# Patient Record
Sex: Female | Born: 1945 | Race: White | Hispanic: No | Marital: Married | Smoking: Former smoker
Health system: Southern US, Community
[De-identification: ages and names within clinical notes are randomized; demographics above are authoritative.]

## PROBLEM LIST (undated history)

## (undated) DIAGNOSIS — J189 Pneumonia, unspecified organism: Secondary | ICD-10-CM

## (undated) DIAGNOSIS — M1990 Unspecified osteoarthritis, unspecified site: Secondary | ICD-10-CM

## (undated) DIAGNOSIS — N189 Chronic kidney disease, unspecified: Secondary | ICD-10-CM

## (undated) DIAGNOSIS — E785 Hyperlipidemia, unspecified: Secondary | ICD-10-CM

## (undated) DIAGNOSIS — Z8719 Personal history of other diseases of the digestive system: Secondary | ICD-10-CM

## (undated) DIAGNOSIS — R0600 Dyspnea, unspecified: Secondary | ICD-10-CM

## (undated) DIAGNOSIS — F419 Anxiety disorder, unspecified: Secondary | ICD-10-CM

## (undated) DIAGNOSIS — I10 Essential (primary) hypertension: Secondary | ICD-10-CM

## (undated) HISTORY — PX: TUBAL LIGATION: SHX77

## (undated) HISTORY — PX: APPENDECTOMY: SHX54

## (undated) HISTORY — DX: Essential (primary) hypertension: I10

## (undated) HISTORY — PX: EYE SURGERY: SHX253

## (undated) HISTORY — DX: Hyperlipidemia, unspecified: E78.5

---

## 1999-07-27 ENCOUNTER — Encounter: Admission: RE | Admit: 1999-07-27 | Discharge: 1999-07-27 | Payer: Self-pay

## 2002-01-20 ENCOUNTER — Encounter: Payer: Self-pay | Admitting: Family Medicine

## 2002-01-20 ENCOUNTER — Encounter: Admission: RE | Admit: 2002-01-20 | Discharge: 2002-01-20 | Payer: Self-pay | Admitting: Family Medicine

## 2003-09-17 ENCOUNTER — Encounter: Admission: RE | Admit: 2003-09-17 | Discharge: 2003-09-17 | Payer: Self-pay | Admitting: Family Medicine

## 2004-10-07 ENCOUNTER — Ambulatory Visit (HOSPITAL_COMMUNITY): Admission: RE | Admit: 2004-10-07 | Discharge: 2004-10-07 | Payer: Self-pay | Admitting: Family Medicine

## 2004-10-26 ENCOUNTER — Other Ambulatory Visit: Admission: RE | Admit: 2004-10-26 | Discharge: 2004-10-26 | Payer: Self-pay | Admitting: Family Medicine

## 2005-09-28 ENCOUNTER — Ambulatory Visit (HOSPITAL_COMMUNITY): Admission: RE | Admit: 2005-09-28 | Discharge: 2005-09-28 | Payer: Self-pay | Admitting: Unknown Physician Specialty

## 2005-12-11 ENCOUNTER — Other Ambulatory Visit: Admission: RE | Admit: 2005-12-11 | Discharge: 2005-12-11 | Payer: Self-pay | Admitting: Family Medicine

## 2006-09-18 ENCOUNTER — Ambulatory Visit (HOSPITAL_COMMUNITY): Admission: RE | Admit: 2006-09-18 | Discharge: 2006-09-18 | Payer: Self-pay | Admitting: Family Medicine

## 2006-12-12 ENCOUNTER — Other Ambulatory Visit: Admission: RE | Admit: 2006-12-12 | Discharge: 2006-12-12 | Payer: Self-pay | Admitting: Family Medicine

## 2007-10-08 ENCOUNTER — Ambulatory Visit (HOSPITAL_COMMUNITY): Admission: RE | Admit: 2007-10-08 | Discharge: 2007-10-08 | Payer: Self-pay | Admitting: Family Medicine

## 2007-10-22 ENCOUNTER — Encounter: Admission: RE | Admit: 2007-10-22 | Discharge: 2007-10-22 | Payer: Self-pay | Admitting: Family Medicine

## 2008-06-10 ENCOUNTER — Other Ambulatory Visit: Admission: RE | Admit: 2008-06-10 | Discharge: 2008-06-10 | Payer: Self-pay | Admitting: Orthopedic Surgery

## 2008-12-25 ENCOUNTER — Ambulatory Visit (HOSPITAL_COMMUNITY): Admission: RE | Admit: 2008-12-25 | Discharge: 2008-12-25 | Payer: Self-pay | Admitting: Family Medicine

## 2009-08-23 ENCOUNTER — Other Ambulatory Visit: Admission: RE | Admit: 2009-08-23 | Discharge: 2009-08-23 | Payer: Self-pay | Admitting: Family Medicine

## 2009-12-27 ENCOUNTER — Ambulatory Visit (HOSPITAL_COMMUNITY): Admission: RE | Admit: 2009-12-27 | Discharge: 2009-12-27 | Payer: Self-pay | Admitting: Family Medicine

## 2010-11-06 ENCOUNTER — Encounter: Payer: Self-pay | Admitting: Family Medicine

## 2010-11-30 ENCOUNTER — Other Ambulatory Visit (HOSPITAL_COMMUNITY): Payer: Self-pay | Admitting: Family Medicine

## 2010-11-30 DIAGNOSIS — Z1231 Encounter for screening mammogram for malignant neoplasm of breast: Secondary | ICD-10-CM

## 2010-11-30 DIAGNOSIS — J069 Acute upper respiratory infection, unspecified: Secondary | ICD-10-CM

## 2010-12-29 ENCOUNTER — Ambulatory Visit (HOSPITAL_COMMUNITY)
Admission: RE | Admit: 2010-12-29 | Discharge: 2010-12-29 | Disposition: A | Discharge status: Home / Self Care | Payer: Medicare Other | Source: Ambulatory Visit | Attending: Family Medicine | Admitting: Family Medicine

## 2010-12-29 DIAGNOSIS — J069 Acute upper respiratory infection, unspecified: Secondary | ICD-10-CM | POA: Insufficient documentation

## 2010-12-29 DIAGNOSIS — R079 Chest pain, unspecified: Secondary | ICD-10-CM | POA: Insufficient documentation

## 2010-12-29 DIAGNOSIS — Z1231 Encounter for screening mammogram for malignant neoplasm of breast: Secondary | ICD-10-CM

## 2010-12-29 DIAGNOSIS — R0989 Other specified symptoms and signs involving the circulatory and respiratory systems: Secondary | ICD-10-CM | POA: Insufficient documentation

## 2010-12-30 ENCOUNTER — Other Ambulatory Visit (HOSPITAL_COMMUNITY): Payer: Self-pay | Admitting: Family Medicine

## 2010-12-30 DIAGNOSIS — Z09 Encounter for follow-up examination after completed treatment for conditions other than malignant neoplasm: Secondary | ICD-10-CM

## 2011-01-02 ENCOUNTER — Ambulatory Visit (HOSPITAL_COMMUNITY): Admission: RE | Admit: 2011-01-02 | Payer: Medicare Other | Source: Ambulatory Visit

## 2011-01-04 ENCOUNTER — Ambulatory Visit (HOSPITAL_COMMUNITY)
Admission: RE | Admit: 2011-01-04 | Discharge: 2011-01-04 | Disposition: A | Discharge status: Home / Self Care | Payer: Medicare Other | Source: Ambulatory Visit | Attending: Family Medicine | Admitting: Family Medicine

## 2011-01-04 DIAGNOSIS — Z09 Encounter for follow-up examination after completed treatment for conditions other than malignant neoplasm: Secondary | ICD-10-CM

## 2011-01-04 DIAGNOSIS — R911 Solitary pulmonary nodule: Secondary | ICD-10-CM | POA: Insufficient documentation

## 2011-12-14 ENCOUNTER — Other Ambulatory Visit (HOSPITAL_COMMUNITY): Payer: Self-pay | Admitting: Family Medicine

## 2011-12-14 DIAGNOSIS — Z1231 Encounter for screening mammogram for malignant neoplasm of breast: Secondary | ICD-10-CM

## 2012-01-08 ENCOUNTER — Ambulatory Visit (HOSPITAL_COMMUNITY)
Admission: RE | Admit: 2012-01-08 | Discharge: 2012-01-08 | Disposition: A | Discharge status: Home / Self Care | Payer: Medicare Other | Source: Ambulatory Visit | Attending: Family Medicine | Admitting: Family Medicine

## 2012-01-08 DIAGNOSIS — Z1231 Encounter for screening mammogram for malignant neoplasm of breast: Secondary | ICD-10-CM | POA: Insufficient documentation

## 2012-03-01 ENCOUNTER — Other Ambulatory Visit: Payer: Self-pay | Admitting: Family Medicine

## 2012-03-01 ENCOUNTER — Other Ambulatory Visit (HOSPITAL_COMMUNITY)
Admission: RE | Admit: 2012-03-01 | Discharge: 2012-03-01 | Disposition: A | Discharge status: Home / Self Care | Payer: Medicare Other | Source: Ambulatory Visit | Attending: Family Medicine | Admitting: Family Medicine

## 2012-03-01 DIAGNOSIS — Z0000 Encounter for general adult medical examination without abnormal findings: Secondary | ICD-10-CM | POA: Insufficient documentation

## 2013-03-13 ENCOUNTER — Other Ambulatory Visit (HOSPITAL_COMMUNITY): Payer: Self-pay | Admitting: Family Medicine

## 2013-03-13 DIAGNOSIS — Z1231 Encounter for screening mammogram for malignant neoplasm of breast: Secondary | ICD-10-CM

## 2013-03-18 ENCOUNTER — Ambulatory Visit (HOSPITAL_COMMUNITY)
Admission: RE | Admit: 2013-03-18 | Discharge: 2013-03-18 | Disposition: A | Discharge status: Home / Self Care | Payer: Medicare Other | Source: Ambulatory Visit | Attending: Family Medicine | Admitting: Family Medicine

## 2013-03-18 DIAGNOSIS — Z1231 Encounter for screening mammogram for malignant neoplasm of breast: Secondary | ICD-10-CM | POA: Insufficient documentation

## 2013-03-19 ENCOUNTER — Other Ambulatory Visit: Payer: Self-pay | Admitting: Family Medicine

## 2013-03-19 DIAGNOSIS — R928 Other abnormal and inconclusive findings on diagnostic imaging of breast: Secondary | ICD-10-CM

## 2013-04-02 ENCOUNTER — Ambulatory Visit
Admission: RE | Admit: 2013-04-02 | Discharge: 2013-04-02 | Disposition: A | Discharge status: Home / Self Care | Payer: Medicare Other | Source: Ambulatory Visit | Attending: Family Medicine | Admitting: Family Medicine

## 2013-04-02 DIAGNOSIS — R928 Other abnormal and inconclusive findings on diagnostic imaging of breast: Secondary | ICD-10-CM

## 2013-08-27 ENCOUNTER — Other Ambulatory Visit: Payer: Self-pay | Admitting: Family Medicine

## 2013-08-27 DIAGNOSIS — N6310 Unspecified lump in the right breast, unspecified quadrant: Secondary | ICD-10-CM

## 2013-09-30 ENCOUNTER — Ambulatory Visit
Admission: RE | Admit: 2013-09-30 | Discharge: 2013-09-30 | Disposition: A | Discharge status: Home / Self Care | Payer: Medicare Other | Source: Ambulatory Visit | Attending: Family Medicine | Admitting: Family Medicine

## 2013-09-30 DIAGNOSIS — N6310 Unspecified lump in the right breast, unspecified quadrant: Secondary | ICD-10-CM

## 2014-03-06 ENCOUNTER — Other Ambulatory Visit: Payer: Self-pay | Admitting: Family Medicine

## 2014-03-06 DIAGNOSIS — N63 Unspecified lump in unspecified breast: Secondary | ICD-10-CM

## 2014-03-19 ENCOUNTER — Ambulatory Visit
Admission: RE | Admit: 2014-03-19 | Discharge: 2014-03-19 | Disposition: A | Discharge status: Home / Self Care | Payer: Medicare Other | Source: Ambulatory Visit | Attending: Family Medicine | Admitting: Family Medicine

## 2014-03-19 ENCOUNTER — Other Ambulatory Visit: Payer: Self-pay | Admitting: Family Medicine

## 2014-03-19 ENCOUNTER — Encounter (INDEPENDENT_AMBULATORY_CARE_PROVIDER_SITE_OTHER): Payer: Self-pay

## 2014-03-19 DIAGNOSIS — N63 Unspecified lump in unspecified breast: Secondary | ICD-10-CM

## 2015-03-22 ENCOUNTER — Other Ambulatory Visit: Payer: Self-pay | Admitting: Family Medicine

## 2015-03-22 DIAGNOSIS — N63 Unspecified lump in unspecified breast: Secondary | ICD-10-CM

## 2015-04-06 ENCOUNTER — Ambulatory Visit
Admission: RE | Admit: 2015-04-06 | Discharge: 2015-04-06 | Disposition: A | Discharge status: Home / Self Care | Payer: Medicare Other | Source: Ambulatory Visit | Attending: Family Medicine | Admitting: Family Medicine

## 2015-04-06 DIAGNOSIS — N63 Unspecified lump in unspecified breast: Secondary | ICD-10-CM

## 2016-01-21 ENCOUNTER — Other Ambulatory Visit: Payer: Self-pay | Admitting: Gastroenterology

## 2016-04-24 ENCOUNTER — Other Ambulatory Visit: Payer: Self-pay | Admitting: Family Medicine

## 2016-04-24 DIAGNOSIS — Z1231 Encounter for screening mammogram for malignant neoplasm of breast: Secondary | ICD-10-CM

## 2016-04-25 ENCOUNTER — Ambulatory Visit
Admission: RE | Admit: 2016-04-25 | Discharge: 2016-04-25 | Disposition: A | Discharge status: Home / Self Care | Payer: Medicare Other | Source: Ambulatory Visit | Attending: Family Medicine | Admitting: Family Medicine

## 2016-04-25 DIAGNOSIS — Z1231 Encounter for screening mammogram for malignant neoplasm of breast: Secondary | ICD-10-CM

## 2016-06-14 ENCOUNTER — Other Ambulatory Visit: Payer: Self-pay | Admitting: Family Medicine

## 2016-06-14 ENCOUNTER — Ambulatory Visit
Admission: RE | Admit: 2016-06-14 | Discharge: 2016-06-14 | Disposition: A | Discharge status: Home / Self Care | Payer: Medicare Other | Source: Ambulatory Visit | Attending: Family Medicine | Admitting: Family Medicine

## 2016-06-14 DIAGNOSIS — R059 Cough, unspecified: Secondary | ICD-10-CM

## 2016-06-14 DIAGNOSIS — R05 Cough: Secondary | ICD-10-CM

## 2016-12-14 DIAGNOSIS — M810 Age-related osteoporosis without current pathological fracture: Secondary | ICD-10-CM | POA: Diagnosis not present

## 2016-12-14 DIAGNOSIS — J301 Allergic rhinitis due to pollen: Secondary | ICD-10-CM | POA: Diagnosis not present

## 2016-12-14 DIAGNOSIS — I10 Essential (primary) hypertension: Secondary | ICD-10-CM | POA: Diagnosis not present

## 2016-12-14 DIAGNOSIS — R69 Illness, unspecified: Secondary | ICD-10-CM | POA: Diagnosis not present

## 2016-12-14 DIAGNOSIS — N183 Chronic kidney disease, stage 3 (moderate): Secondary | ICD-10-CM | POA: Diagnosis not present

## 2016-12-14 DIAGNOSIS — E7800 Pure hypercholesterolemia, unspecified: Secondary | ICD-10-CM | POA: Diagnosis not present

## 2017-02-26 DIAGNOSIS — I10 Essential (primary) hypertension: Secondary | ICD-10-CM | POA: Diagnosis not present

## 2017-02-26 DIAGNOSIS — Z0100 Encounter for examination of eyes and vision without abnormal findings: Secondary | ICD-10-CM | POA: Diagnosis not present

## 2017-02-26 DIAGNOSIS — E7800 Pure hypercholesterolemia, unspecified: Secondary | ICD-10-CM | POA: Diagnosis not present

## 2017-03-14 DIAGNOSIS — R69 Illness, unspecified: Secondary | ICD-10-CM | POA: Diagnosis not present

## 2017-04-06 ENCOUNTER — Other Ambulatory Visit: Payer: Self-pay | Admitting: Family Medicine

## 2017-04-06 DIAGNOSIS — Z1231 Encounter for screening mammogram for malignant neoplasm of breast: Secondary | ICD-10-CM

## 2017-04-27 ENCOUNTER — Ambulatory Visit: Payer: Medicare Other

## 2017-04-30 ENCOUNTER — Ambulatory Visit
Admission: RE | Admit: 2017-04-30 | Discharge: 2017-04-30 | Disposition: A | Discharge status: Home / Self Care | Payer: Medicare Other | Source: Ambulatory Visit | Attending: Family Medicine | Admitting: Family Medicine

## 2017-04-30 DIAGNOSIS — Z1231 Encounter for screening mammogram for malignant neoplasm of breast: Secondary | ICD-10-CM

## 2017-06-21 DIAGNOSIS — I10 Essential (primary) hypertension: Secondary | ICD-10-CM | POA: Diagnosis not present

## 2017-06-21 DIAGNOSIS — M8580 Other specified disorders of bone density and structure, unspecified site: Secondary | ICD-10-CM | POA: Diagnosis not present

## 2017-06-21 DIAGNOSIS — E7800 Pure hypercholesterolemia, unspecified: Secondary | ICD-10-CM | POA: Diagnosis not present

## 2017-06-21 DIAGNOSIS — Z1211 Encounter for screening for malignant neoplasm of colon: Secondary | ICD-10-CM | POA: Diagnosis not present

## 2017-06-21 DIAGNOSIS — R69 Illness, unspecified: Secondary | ICD-10-CM | POA: Diagnosis not present

## 2017-06-21 DIAGNOSIS — N183 Chronic kidney disease, stage 3 (moderate): Secondary | ICD-10-CM | POA: Diagnosis not present

## 2017-07-12 DIAGNOSIS — R0600 Dyspnea, unspecified: Secondary | ICD-10-CM | POA: Diagnosis not present

## 2017-07-12 DIAGNOSIS — J209 Acute bronchitis, unspecified: Secondary | ICD-10-CM | POA: Diagnosis not present

## 2017-08-06 DIAGNOSIS — D126 Benign neoplasm of colon, unspecified: Secondary | ICD-10-CM | POA: Diagnosis not present

## 2017-08-06 DIAGNOSIS — K635 Polyp of colon: Secondary | ICD-10-CM | POA: Diagnosis not present

## 2017-08-06 DIAGNOSIS — K644 Residual hemorrhoidal skin tags: Secondary | ICD-10-CM | POA: Diagnosis not present

## 2017-08-06 DIAGNOSIS — Z86010 Personal history of colonic polyps: Secondary | ICD-10-CM | POA: Diagnosis not present

## 2017-08-06 DIAGNOSIS — K648 Other hemorrhoids: Secondary | ICD-10-CM | POA: Diagnosis not present

## 2017-08-09 DIAGNOSIS — D126 Benign neoplasm of colon, unspecified: Secondary | ICD-10-CM | POA: Diagnosis not present

## 2017-08-09 DIAGNOSIS — K635 Polyp of colon: Secondary | ICD-10-CM | POA: Diagnosis not present

## 2017-09-20 DIAGNOSIS — R69 Illness, unspecified: Secondary | ICD-10-CM | POA: Diagnosis not present

## 2017-12-19 DIAGNOSIS — N183 Chronic kidney disease, stage 3 (moderate): Secondary | ICD-10-CM | POA: Diagnosis not present

## 2017-12-19 DIAGNOSIS — I10 Essential (primary) hypertension: Secondary | ICD-10-CM | POA: Diagnosis not present

## 2017-12-19 DIAGNOSIS — M8580 Other specified disorders of bone density and structure, unspecified site: Secondary | ICD-10-CM | POA: Diagnosis not present

## 2017-12-19 DIAGNOSIS — J300 Vasomotor rhinitis: Secondary | ICD-10-CM | POA: Diagnosis not present

## 2017-12-19 DIAGNOSIS — E7800 Pure hypercholesterolemia, unspecified: Secondary | ICD-10-CM | POA: Diagnosis not present

## 2017-12-19 DIAGNOSIS — R69 Illness, unspecified: Secondary | ICD-10-CM | POA: Diagnosis not present

## 2018-03-20 DIAGNOSIS — Z0100 Encounter for examination of eyes and vision without abnormal findings: Secondary | ICD-10-CM | POA: Diagnosis not present

## 2018-03-20 DIAGNOSIS — E7800 Pure hypercholesterolemia, unspecified: Secondary | ICD-10-CM | POA: Diagnosis not present

## 2018-03-20 DIAGNOSIS — I10 Essential (primary) hypertension: Secondary | ICD-10-CM | POA: Diagnosis not present

## 2018-03-25 DIAGNOSIS — R69 Illness, unspecified: Secondary | ICD-10-CM | POA: Diagnosis not present

## 2018-05-21 ENCOUNTER — Other Ambulatory Visit: Payer: Self-pay | Admitting: Family Medicine

## 2018-05-21 DIAGNOSIS — Z1231 Encounter for screening mammogram for malignant neoplasm of breast: Secondary | ICD-10-CM

## 2018-05-29 DIAGNOSIS — H2513 Age-related nuclear cataract, bilateral: Secondary | ICD-10-CM | POA: Diagnosis not present

## 2018-05-29 DIAGNOSIS — H25013 Cortical age-related cataract, bilateral: Secondary | ICD-10-CM | POA: Diagnosis not present

## 2018-05-29 DIAGNOSIS — H25043 Posterior subcapsular polar age-related cataract, bilateral: Secondary | ICD-10-CM | POA: Diagnosis not present

## 2018-05-29 DIAGNOSIS — H2512 Age-related nuclear cataract, left eye: Secondary | ICD-10-CM | POA: Diagnosis not present

## 2018-06-13 ENCOUNTER — Ambulatory Visit: Payer: Medicare Other

## 2018-06-24 DIAGNOSIS — I10 Essential (primary) hypertension: Secondary | ICD-10-CM | POA: Diagnosis not present

## 2018-06-24 DIAGNOSIS — Z23 Encounter for immunization: Secondary | ICD-10-CM | POA: Diagnosis not present

## 2018-06-24 DIAGNOSIS — M8580 Other specified disorders of bone density and structure, unspecified site: Secondary | ICD-10-CM | POA: Diagnosis not present

## 2018-06-24 DIAGNOSIS — R69 Illness, unspecified: Secondary | ICD-10-CM | POA: Diagnosis not present

## 2018-06-27 ENCOUNTER — Ambulatory Visit
Admission: RE | Admit: 2018-06-27 | Discharge: 2018-06-27 | Disposition: A | Discharge status: Home / Self Care | Payer: Medicare HMO | Source: Ambulatory Visit | Attending: Family Medicine | Admitting: Family Medicine

## 2018-06-27 DIAGNOSIS — Z1231 Encounter for screening mammogram for malignant neoplasm of breast: Secondary | ICD-10-CM

## 2018-07-09 DIAGNOSIS — H2512 Age-related nuclear cataract, left eye: Secondary | ICD-10-CM | POA: Diagnosis not present

## 2018-07-09 DIAGNOSIS — H25812 Combined forms of age-related cataract, left eye: Secondary | ICD-10-CM | POA: Diagnosis not present

## 2018-07-10 DIAGNOSIS — H2511 Age-related nuclear cataract, right eye: Secondary | ICD-10-CM | POA: Diagnosis not present

## 2018-07-23 DIAGNOSIS — H25811 Combined forms of age-related cataract, right eye: Secondary | ICD-10-CM | POA: Diagnosis not present

## 2018-07-23 DIAGNOSIS — H2511 Age-related nuclear cataract, right eye: Secondary | ICD-10-CM | POA: Diagnosis not present

## 2018-09-25 DIAGNOSIS — R69 Illness, unspecified: Secondary | ICD-10-CM | POA: Diagnosis not present

## 2018-12-24 DIAGNOSIS — N183 Chronic kidney disease, stage 3 (moderate): Secondary | ICD-10-CM | POA: Diagnosis not present

## 2018-12-24 DIAGNOSIS — R69 Illness, unspecified: Secondary | ICD-10-CM | POA: Diagnosis not present

## 2018-12-24 DIAGNOSIS — I10 Essential (primary) hypertension: Secondary | ICD-10-CM | POA: Diagnosis not present

## 2018-12-24 DIAGNOSIS — Z0000 Encounter for general adult medical examination without abnormal findings: Secondary | ICD-10-CM | POA: Diagnosis not present

## 2018-12-24 DIAGNOSIS — M8580 Other specified disorders of bone density and structure, unspecified site: Secondary | ICD-10-CM | POA: Diagnosis not present

## 2018-12-24 DIAGNOSIS — E7800 Pure hypercholesterolemia, unspecified: Secondary | ICD-10-CM | POA: Diagnosis not present

## 2019-05-27 ENCOUNTER — Other Ambulatory Visit: Payer: Self-pay | Admitting: Family Medicine

## 2019-05-27 DIAGNOSIS — Z1231 Encounter for screening mammogram for malignant neoplasm of breast: Secondary | ICD-10-CM

## 2019-07-03 DIAGNOSIS — R69 Illness, unspecified: Secondary | ICD-10-CM | POA: Diagnosis not present

## 2019-07-04 DIAGNOSIS — E7800 Pure hypercholesterolemia, unspecified: Secondary | ICD-10-CM | POA: Diagnosis not present

## 2019-07-04 DIAGNOSIS — N183 Chronic kidney disease, stage 3 (moderate): Secondary | ICD-10-CM | POA: Diagnosis not present

## 2019-07-04 DIAGNOSIS — R69 Illness, unspecified: Secondary | ICD-10-CM | POA: Diagnosis not present

## 2019-07-04 DIAGNOSIS — M810 Age-related osteoporosis without current pathological fracture: Secondary | ICD-10-CM | POA: Diagnosis not present

## 2019-07-04 DIAGNOSIS — I10 Essential (primary) hypertension: Secondary | ICD-10-CM | POA: Diagnosis not present

## 2019-07-04 DIAGNOSIS — R05 Cough: Secondary | ICD-10-CM | POA: Diagnosis not present

## 2019-07-10 ENCOUNTER — Other Ambulatory Visit: Payer: Self-pay

## 2019-07-10 ENCOUNTER — Ambulatory Visit
Admission: RE | Admit: 2019-07-10 | Discharge: 2019-07-10 | Disposition: A | Discharge status: Home / Self Care | Payer: Medicare HMO | Source: Ambulatory Visit | Attending: Family Medicine | Admitting: Family Medicine

## 2019-07-10 DIAGNOSIS — Z1231 Encounter for screening mammogram for malignant neoplasm of breast: Secondary | ICD-10-CM

## 2019-08-24 DIAGNOSIS — R69 Illness, unspecified: Secondary | ICD-10-CM | POA: Diagnosis not present

## 2019-10-17 DIAGNOSIS — J449 Chronic obstructive pulmonary disease, unspecified: Secondary | ICD-10-CM

## 2019-10-17 HISTORY — DX: Chronic obstructive pulmonary disease, unspecified: J44.9

## 2019-11-11 ENCOUNTER — Ambulatory Visit: Payer: Medicare HMO

## 2019-11-20 ENCOUNTER — Ambulatory Visit: Payer: Medicare HMO | Attending: Internal Medicine

## 2019-12-10 DIAGNOSIS — M1990 Unspecified osteoarthritis, unspecified site: Secondary | ICD-10-CM | POA: Diagnosis not present

## 2019-12-10 DIAGNOSIS — I10 Essential (primary) hypertension: Secondary | ICD-10-CM | POA: Diagnosis not present

## 2019-12-10 DIAGNOSIS — M8580 Other specified disorders of bone density and structure, unspecified site: Secondary | ICD-10-CM | POA: Diagnosis not present

## 2019-12-10 DIAGNOSIS — E7800 Pure hypercholesterolemia, unspecified: Secondary | ICD-10-CM | POA: Diagnosis not present

## 2019-12-10 DIAGNOSIS — R69 Illness, unspecified: Secondary | ICD-10-CM | POA: Diagnosis not present

## 2019-12-10 DIAGNOSIS — M810 Age-related osteoporosis without current pathological fracture: Secondary | ICD-10-CM | POA: Diagnosis not present

## 2019-12-26 DIAGNOSIS — E7800 Pure hypercholesterolemia, unspecified: Secondary | ICD-10-CM | POA: Diagnosis not present

## 2019-12-26 DIAGNOSIS — R69 Illness, unspecified: Secondary | ICD-10-CM | POA: Diagnosis not present

## 2019-12-26 DIAGNOSIS — Z0000 Encounter for general adult medical examination without abnormal findings: Secondary | ICD-10-CM | POA: Diagnosis not present

## 2019-12-26 DIAGNOSIS — M8580 Other specified disorders of bone density and structure, unspecified site: Secondary | ICD-10-CM | POA: Diagnosis not present

## 2019-12-26 DIAGNOSIS — N183 Chronic kidney disease, stage 3 unspecified: Secondary | ICD-10-CM | POA: Diagnosis not present

## 2019-12-26 DIAGNOSIS — I10 Essential (primary) hypertension: Secondary | ICD-10-CM | POA: Diagnosis not present

## 2019-12-26 DIAGNOSIS — J449 Chronic obstructive pulmonary disease, unspecified: Secondary | ICD-10-CM | POA: Diagnosis not present

## 2020-01-28 ENCOUNTER — Ambulatory Visit (INDEPENDENT_AMBULATORY_CARE_PROVIDER_SITE_OTHER): Payer: Medicare HMO

## 2020-01-28 ENCOUNTER — Ambulatory Visit: Payer: Medicare HMO | Admitting: Pulmonary Disease

## 2020-01-28 ENCOUNTER — Other Ambulatory Visit: Payer: Self-pay

## 2020-01-28 ENCOUNTER — Encounter: Payer: Self-pay | Admitting: Pulmonary Disease

## 2020-01-28 VITALS — BP 116/60 | HR 91 | Temp 97.1°F | Ht 63.0 in | Wt 147.2 lb

## 2020-01-28 DIAGNOSIS — R69 Illness, unspecified: Secondary | ICD-10-CM | POA: Diagnosis not present

## 2020-01-28 DIAGNOSIS — R05 Cough: Secondary | ICD-10-CM

## 2020-01-28 DIAGNOSIS — F17200 Nicotine dependence, unspecified, uncomplicated: Secondary | ICD-10-CM | POA: Diagnosis not present

## 2020-01-28 DIAGNOSIS — F32A Depression, unspecified: Secondary | ICD-10-CM

## 2020-01-28 DIAGNOSIS — R059 Cough, unspecified: Secondary | ICD-10-CM

## 2020-01-28 DIAGNOSIS — F329 Major depressive disorder, single episode, unspecified: Secondary | ICD-10-CM

## 2020-01-28 HISTORY — DX: Major depressive disorder, single episode, unspecified: F32.9

## 2020-01-28 HISTORY — DX: Depression, unspecified: F32.A

## 2020-01-28 NOTE — Patient Instructions (Signed)
Chronic cough Likely presence of obstructive disease/COPD  Breathing study Chest x-ray Low-dose CT scan  Continue using albuterol as needed for shortness of breath  Call with significant concerns

## 2020-01-28 NOTE — Progress Notes (Signed)
Amy [name]    [id number]    24-Nov-1945  Primary Care Physician:Harris, Gwyndolyn Saxon, MD  Referring Physician: [name], MD [place],  [place] [id number]  Chief complaint:   Chronic cough  HPI:  Patient has a complaint of cough, no significant associated shortness of breath Able to walk a mile, able to walk uphill with future history Cough and brings up clear phlegm Not feeling acutely ill  Has smoked for many years, recently cut down to half a pack a day, was smoking 1 pack a day for many years  History of chronic kidney disease, Hypertension, insomnia, hypercholesterolemia  Occupation: no pertinent occupational disease Exposures: Smoking history: Half pack a day  Outpatient Encounter Medications as of 01/28/2020  Medication Sig  . albuterol (VENTOLIN HFA) 108 (90 Base) MCG/ACT inhaler Inhale into the lungs every 6 (six) hours as needed for wheezing or shortness of breath.   No facility-administered encounter medications on file as of 01/28/2020.    Allergies as of 01/28/2020  . (No Known Allergies)    Past Medical History:  Diagnosis Date  . Depression 01/28/2020  . Hyperlipidemia   . Hypertension     Past Surgical History:  Procedure Laterality Date  . APPENDECTOMY    . TUBAL LIGATION      History reviewed. No pertinent family history.  Social History   Socioeconomic History  . Marital status: Married    Spouse name: Not on file  . Number of children: Not on file  . Years of education: Not on file  . Highest education level: Not on file  Occupational History  . Not on file  Tobacco Use  . Smoking status: Current Every Day Smoker    Packs/day: 1.00    Years: 55.00    Pack years: 55.00    Types: Cigarettes  . Smokeless tobacco: Never Used  Substance and Sexual Activity  . Alcohol use: Not on file  . Drug use: Not on file  . Sexual activity: Not on file  Other Topics Concern  . Not on file  Social  History Narrative  . Not on file   Social Determinants of Health   Financial Resource Strain:   . Difficulty of Paying Living Expenses:   Food Insecurity:   . Worried About Charity fundraiser in the Last Year:   . Arboriculturist in the Last Year:   Transportation Needs:   . Film/video editor (Medical):   Marland Kitchen Lack of Transportation (Non-Medical):   Physical Activity:   . Days of Exercise per Week:   . Minutes of Exercise per Session:   Stress:   . Feeling of Stress :   Social Connections:   . Frequency of Communication with Friends and Family:   . Frequency of Social Gatherings with Friends and Family:   . Attends Religious Services:   . Active Member of Clubs or Organizations:   . Attends Archivist Meetings:   Marland Kitchen Marital Status:   Intimate Partner Violence:   . Fear of Current or Ex-Partner:   . Emotionally Abused:   Marland Kitchen Physically Abused:   . Sexually Abused:     Review of Systems  Constitutional: Negative.   HENT: Negative.   Respiratory: Positive for cough. Negative for shortness of breath.   Cardiovascular: Negative.   Gastrointestinal: Negative.     Vitals:   01/28/20 1004  BP: 116/60  Pulse: 91  Temp: (!) 97.1 F (36.2 C)  SpO2: 96%     Physical Exam  Constitutional: She is oriented to person, place, and time. She appears well-developed and well-nourished.  HENT:  Head: Normocephalic and atraumatic.  Eyes: Pupils are equal, round, and reactive to light. Conjunctivae are normal. Right eye exhibits no discharge. Left eye exhibits no discharge.  Neck: No tracheal deviation present. No thyromegaly present.  Cardiovascular: Normal rate and regular rhythm.  Pulmonary/Chest: Effort normal. No respiratory distress. She has no wheezes. She has no rales. She exhibits no tenderness.  Air movement is decreased bilaterally  Abdominal: Soft. Bowel sounds are normal. She exhibits no distension.  Musculoskeletal:     Cervical back: Normal range of  motion and neck supple.  Neurological: She is alert and oriented to person, place, and time.  Skin: Skin is warm and dry. No erythema.  Psychiatric: She has a normal mood and affect.   Data Reviewed: Chest x-ray from 2017 reviewed showing pleural thickening apically  Assessment:  Chronic cough  Likely obstructive lung disease  Active smoker  -Did provide smoking cessation counseling  Plan/Recommendations:  Obtain chest x-ray  Obtain pulmonary function test  Low-dose CT cancer screening-55-pack-year smoking history  Encouraged to work on quitting smoking  Albuterol for shortness of breath  Encouraged to call with any significant concerns   Sherrilyn Rist MD [facility] 01/28/2020, 10:24 AM  CC: [name], MD

## 2020-02-02 ENCOUNTER — Inpatient Hospital Stay: Admission: RE | Admit: 2020-02-02 | Payer: Medicare HMO | Source: Ambulatory Visit

## 2020-02-03 ENCOUNTER — Other Ambulatory Visit: Payer: Self-pay

## 2020-02-03 ENCOUNTER — Telehealth: Payer: Self-pay | Admitting: Pulmonary Disease

## 2020-02-03 ENCOUNTER — Ambulatory Visit (INDEPENDENT_AMBULATORY_CARE_PROVIDER_SITE_OTHER)
Admission: RE | Admit: 2020-02-03 | Discharge: 2020-02-03 | Disposition: A | Discharge status: Home / Self Care | Payer: Medicare HMO | Source: Ambulatory Visit | Attending: Pulmonary Disease | Admitting: Pulmonary Disease

## 2020-02-03 DIAGNOSIS — Z87891 Personal history of nicotine dependence: Secondary | ICD-10-CM

## 2020-02-03 DIAGNOSIS — F17200 Nicotine dependence, unspecified, uncomplicated: Secondary | ICD-10-CM

## 2020-02-03 DIAGNOSIS — IMO0001 Reserved for inherently not codable concepts without codable children: Secondary | ICD-10-CM

## 2020-02-03 DIAGNOSIS — R911 Solitary pulmonary nodule: Secondary | ICD-10-CM

## 2020-02-03 DIAGNOSIS — R69 Illness, unspecified: Secondary | ICD-10-CM | POA: Diagnosis not present

## 2020-02-03 NOTE — Telephone Encounter (Signed)
Call report on lung ca screening 02/03/20    IMPRESSION: Lung-RADS 4A, suspicious. Follow up low-dose chest CT without contrast in 3 months (please use the following order, "CT CHEST LCS NODULE FOLLOW-UP W/O CM") is recommended. Alternatively, PET may be considered.  10.8 mm irregular nodule in the posterior right lower lobe.  These results will be called to the ordering clinician or representative by the Radiologist Assistant, and communication documented in the PACS or [employer].  Aortic Atherosclerosis (ICD10-I70.0) and Emphysema (ICD10-J43.9).   Electronically Signed   By: Julian Hy M.D.   On: 02/03/2020 14:25

## 2020-02-03 NOTE — Telephone Encounter (Signed)
Called patient  Spoke with the husband about CT scan of the chest  Patient may be able to call back tomorrow  Let us order a PET scan to ascertain activity in this nodule-if the nodule is active may need further intervention

## 2020-02-03 NOTE — Telephone Encounter (Signed)
Order for PET has been placed.

## 2020-02-04 ENCOUNTER — Telehealth: Payer: Self-pay | Admitting: Pulmonary Disease

## 2020-02-04 NOTE — Telephone Encounter (Signed)
PFT can be rescheduled for a convenient time

## 2020-02-04 NOTE — Telephone Encounter (Signed)
Spoke with patient. The PFT has been rescheduled for 5/6, covid test rescheduled for 5/4. Patient verbalized understanding. Nothing further needed at time of call.

## 2020-02-04 NOTE — Telephone Encounter (Signed)
Called and spoke with patient.  Patient stated she is scheduled for PET scan 02/11/20, but Patient is scheduled for PFT 02/16/20, with covid swab 02/13/20.  Patient is wanting to if PFT needs to be rescheduled, since she is having a PET scan, depending on results.  Message routed to Dr.Olalere to advise

## 2020-02-11 ENCOUNTER — Other Ambulatory Visit: Payer: Self-pay

## 2020-02-11 ENCOUNTER — Ambulatory Visit (HOSPITAL_COMMUNITY)
Admission: RE | Admit: 2020-02-11 | Discharge: 2020-02-11 | Disposition: A | Discharge status: Home / Self Care | Payer: Medicare HMO | Source: Ambulatory Visit | Attending: Pulmonary Disease | Admitting: Pulmonary Disease

## 2020-02-11 DIAGNOSIS — I700 Atherosclerosis of aorta: Secondary | ICD-10-CM | POA: Insufficient documentation

## 2020-02-11 DIAGNOSIS — R911 Solitary pulmonary nodule: Secondary | ICD-10-CM | POA: Diagnosis present

## 2020-02-11 DIAGNOSIS — I2510 Atherosclerotic heart disease of native coronary artery without angina pectoris: Secondary | ICD-10-CM | POA: Diagnosis not present

## 2020-02-11 DIAGNOSIS — J439 Emphysema, unspecified: Secondary | ICD-10-CM | POA: Insufficient documentation

## 2020-02-11 DIAGNOSIS — R918 Other nonspecific abnormal finding of lung field: Secondary | ICD-10-CM | POA: Insufficient documentation

## 2020-02-11 DIAGNOSIS — C3431 Malignant neoplasm of lower lobe, right bronchus or lung: Secondary | ICD-10-CM | POA: Diagnosis not present

## 2020-02-11 LAB — GLUCOSE, CAPILLARY: Glucose-Capillary: 91 mg/dL (ref 70–99)

## 2020-02-11 MED ORDER — FLUDEOXYGLUCOSE F - 18 (FDG) INJECTION
7.6000 | Freq: Once | INTRAVENOUS | Status: AC | PRN
  Administered 2020-02-11: 14:00:00 7.6 via INTRAVENOUS

## 2020-02-12 ENCOUNTER — Telehealth: Payer: Self-pay | Admitting: Pulmonary Disease

## 2020-02-12 DIAGNOSIS — R911 Solitary pulmonary nodule: Secondary | ICD-10-CM

## 2020-02-12 DIAGNOSIS — IMO0001 Reserved for inherently not codable concepts without codable children: Secondary | ICD-10-CM

## 2020-02-12 NOTE — Telephone Encounter (Signed)
Patient inquiring results of PET, it has already posted to her mychart.

## 2020-02-12 NOTE — Telephone Encounter (Signed)
Spoke with patient  Referral to cardiothoracic surgery for excision of PET positive nodule  To be seen ASAP as can be accommodated

## 2020-02-12 NOTE — Telephone Encounter (Signed)
Referral has been placed per Dr. Ander Slade.

## 2020-02-13 ENCOUNTER — Other Ambulatory Visit (HOSPITAL_COMMUNITY): Payer: Medicare HMO

## 2020-02-17 ENCOUNTER — Other Ambulatory Visit (HOSPITAL_COMMUNITY)
Admission: RE | Admit: 2020-02-17 | Discharge: 2020-02-17 | Disposition: A | Discharge status: Home / Self Care | Payer: Medicare HMO | Source: Ambulatory Visit | Attending: Pulmonary Disease | Admitting: Pulmonary Disease

## 2020-02-17 DIAGNOSIS — Z20822 Contact with and (suspected) exposure to covid-19: Secondary | ICD-10-CM | POA: Insufficient documentation

## 2020-02-17 DIAGNOSIS — Z01812 Encounter for preprocedural laboratory examination: Secondary | ICD-10-CM | POA: Diagnosis not present

## 2020-02-17 LAB — SARS CORONAVIRUS 2 (TAT 6-24 HRS): SARS Coronavirus 2: NEGATIVE

## 2020-02-19 ENCOUNTER — Ambulatory Visit (INDEPENDENT_AMBULATORY_CARE_PROVIDER_SITE_OTHER): Payer: Medicare HMO | Admitting: Pulmonary Disease

## 2020-02-19 ENCOUNTER — Other Ambulatory Visit: Payer: Self-pay

## 2020-02-19 DIAGNOSIS — R05 Cough: Secondary | ICD-10-CM | POA: Diagnosis not present

## 2020-02-19 DIAGNOSIS — R059 Cough, unspecified: Secondary | ICD-10-CM

## 2020-02-19 LAB — PULMONARY FUNCTION TEST
DL/VA % pred: 83 %
DL/VA: 3.47 ml/min/mmHg/L
DLCO cor % pred: 82 %
DLCO cor: 15.16 ml/min/mmHg
DLCO unc % pred: 82 %
DLCO unc: 15.16 ml/min/mmHg
FEF 25-75 Post: 0.24 L/sec
FEF 25-75 Pre: 0.43 L/sec
FEF2575-%Change-Post: -44 %
FEF2575-%Pred-Post: 15 %
FEF2575-%Pred-Pre: 27 %
FEV1-%Change-Post: -22 %
FEV1-%Pred-Post: 39 %
FEV1-%Pred-Pre: 50 %
FEV1-Post: 0.79 L
FEV1-Pre: 1.02 L
FEV1FVC-%Change-Post: -5 %
FEV1FVC-%Pred-Pre: 66 %
FEV6-%Change-Post: -16 %
FEV6-%Pred-Post: 65 %
FEV6-%Pred-Pre: 78 %
FEV6-Post: 1.67 L
FEV6-Pre: 2.02 L
FEV6FVC-%Change-Post: 1 %
FEV6FVC-%Pred-Post: 105 %
FEV6FVC-%Pred-Pre: 103 %
FVC-%Change-Post: -18 %
FVC-%Pred-Post: 62 %
FVC-%Pred-Pre: 76 %
FVC-Post: 1.67 L
FVC-Pre: 2.05 L
Post FEV1/FVC ratio: 47 %
Post FEV6/FVC ratio: 100 %
Pre FEV1/FVC ratio: 50 %
Pre FEV6/FVC Ratio: 98 %
RV % pred: 189 %
RV: 4.25 L
TLC % pred: 136 %
TLC: 6.68 L

## 2020-02-19 NOTE — Progress Notes (Signed)
Full PFT performed today. °

## 2020-02-24 ENCOUNTER — Institutional Professional Consult (permissible substitution): Payer: Medicare HMO | Admitting: Thoracic Surgery (Cardiothoracic Vascular Surgery)

## 2020-02-24 ENCOUNTER — Other Ambulatory Visit: Payer: Self-pay

## 2020-02-24 ENCOUNTER — Encounter: Payer: Self-pay | Admitting: Thoracic Surgery (Cardiothoracic Vascular Surgery)

## 2020-02-24 ENCOUNTER — Other Ambulatory Visit: Payer: Self-pay | Admitting: *Deleted

## 2020-02-24 ENCOUNTER — Encounter: Payer: Self-pay | Admitting: *Deleted

## 2020-02-24 VITALS — BP 131/72 | HR 81 | Temp 98.1°F | Resp 20 | Ht 64.0 in | Wt 148.5 lb

## 2020-02-24 DIAGNOSIS — R911 Solitary pulmonary nodule: Secondary | ICD-10-CM | POA: Diagnosis not present

## 2020-02-24 NOTE — H&P (View-Only) (Signed)
PCP is [name], MD Referring Provider is Laurin Coder, MD  Chief Complaint  Patient presents with  . Lung Lesion    Surgical eval, PET Scan 02/11/20, Chest CT 02/03/20,  PFT's 02/19/20    HPI: Amy [name] sent for consultation regarding a right lower lobe lung nodule.  Amy [name] is a 74 year old woman with a past medical history significant for hypertension, hyperlipidemia, depression, stage III chronic kidney disease, coronary atherosclerosis on CT, and ongoing tobacco abuse.  She began smoking around 74 years of age and smoked about a pack a day prior to last year or 2 when she cut down to about 1/2 pack/day.  She complained of a chronic cough.  She was referred to Dr. Ander Slade.  Given her age and smoking history she had a low-dose CT done for lung cancer screening.  That showed a spiculated right lower lobe lung nodule.  A PET/CT showed nodule was hypermetabolic with an SUV of 5.1.  She continues to smoke about 1/2 pack/day.  She denies any chest pain, pressure, or tightness.  She does have a chronic cough.  Occasionally brings up clear phlegm.  She can walk through the grocery store around [employer] without any shortness of breath.  She can walk up a flight of stairs without stopping.  She had some communication issues with the person doing her pulmonary function testing.  No change in appetite or weight loss.  No unusual headaches or visual changes.  No new bone or joint pain.  Zubrod Score: At the time of surgery this patient's most appropriate activity status/level should be described as: []     0    Normal activity, no symptoms [x]     1    Restricted in physical strenuous activity but ambulatory, able to do out light work []     2    Ambulatory and capable of self care, unable to do work activities, up and about >50 % of waking hours                              []     3    Only limited self care, in bed greater than 50% of waking hours []     4    Completely disabled, no  self care, confined to bed or chair []     5    Moribund  Past Medical History:  Diagnosis Date  . Depression 01/28/2020  . Hyperlipidemia   . Hypertension     Past Surgical History:  Procedure Laterality Date  . APPENDECTOMY    . TUBAL LIGATION      History reviewed. No pertinent family history.  Social History Social History   Tobacco Use  . Smoking status: Current Every Day Smoker    Packs/day: 1.00    Years: 55.00    Pack years: 55.00    Types: Cigarettes  . Smokeless tobacco: Never Used  Substance Use Topics  . Alcohol use: Not on file  . Drug use: Not on file    Current Outpatient Medications  Medication Sig Dispense Refill  . albuterol (VENTOLIN HFA) 108 (90 Base) MCG/ACT inhaler Inhale into the lungs every 6 (six) hours as needed for wheezing or shortness of breath.    . [employer] TURMERIC CURCUMIN PO Take 450 mg by mouth in the morning and at bedtime.    Marland Kitchen lisinopril (ZESTRIL) 20 MG tablet Take 20 mg by mouth daily.    Marland Kitchen  magnesium oxide (MAG-OX) 400 MG tablet Take 400 mg by mouth daily.    Marland Kitchen omega-3 acid ethyl esters (LOVAZA) 1 g capsule Take 1,600 mg by mouth 2 (two) times daily. Omega 3-6-9    . raloxifene (EVISTA) 60 MG tablet Take 60 mg by mouth daily.    . sertraline (ZOLOFT) 50 MG tablet Take 50 mg by mouth daily.    . simvastatin (ZOCOR) 40 MG tablet Take 40 mg by mouth daily.     No current facility-administered medications for this visit.    No Known Allergies  Review of Systems  Constitutional: Negative for activity change, appetite change and unexpected weight change.  HENT: Positive for hearing loss. Negative for trouble swallowing and voice change.   Eyes: Negative for visual disturbance.  Respiratory: Positive for cough. Negative for wheezing.   Cardiovascular: Negative for chest pain and leg swelling.  Gastrointestinal: Negative for abdominal distention and abdominal pain.  Genitourinary: Negative for difficulty urinating and dysuria.   Musculoskeletal: Positive for arthralgias and joint swelling.  Neurological: Negative for seizures, syncope and weakness.  Hematological: Negative for adenopathy. Bruises/bleeds easily.  All other systems reviewed and are negative.   BP 131/72 (BP Location: Right Arm, Patient Position: Sitting, Cuff Size: Normal)   Pulse 81   Temp 98.1 F (36.7 C) (Temporal)   Resp 20   Ht 5\' 4"  (1.626 m)   Wt 148 lb 8 oz (67.4 kg)   SpO2 97% Comment: RA  BMI 25.49 kg/m  Physical Exam Constitutional:      General: She is not in acute distress.    Appearance: Normal appearance.  HENT:     Head: Normocephalic and atraumatic.  Eyes:     General: No scleral icterus.    Extraocular Movements: Extraocular movements intact.  Neck:     Vascular: No carotid bruit.  Cardiovascular:     Rate and Rhythm: Normal rate and regular rhythm.     Heart sounds: Normal heart sounds. No murmur. No friction rub. No gallop.   Pulmonary:     Effort: Pulmonary effort is normal. No respiratory distress.     Breath sounds: Normal breath sounds. No wheezing or rales.  Abdominal:     General: There is no distension.     Palpations: Abdomen is soft.     Tenderness: There is no abdominal tenderness.  Musculoskeletal:     Cervical back: Neck supple.  Lymphadenopathy:     Cervical: No cervical adenopathy.  Skin:    General: Skin is warm and dry.  Neurological:     General: No focal deficit present.     Mental Status: She is alert and oriented to person, place, and time.     Cranial Nerves: No cranial nerve deficit.     Motor: No weakness.     Gait: Gait normal.    Diagnostic Tests: CT CHEST WITHOUT CONTRAST LOW-DOSE FOR LUNG CANCER SCREENING  TECHNIQUE: Multidetector CT imaging of the chest was performed following the standard protocol without IV contrast.  COMPARISON:  None.  FINDINGS: Cardiovascular: Heart is normal in size.  No pericardial effusion.  No evidence of thoracic aortic aneurysm.  Atherosclerotic calcifications of the aortic arch.  Three vessel coronary atherosclerosis.  Mediastinum/Nodes: No suspicious mediastinal lymphadenopathy.  Visualized thyroid is unremarkable.  Lungs/Pleura: Biapical pleural-parenchymal scarring.  Mild centrilobular and paraseptal emphysematous changes, upper lung predominant.  No focal consolidation.  Small bilateral pulmonary nodules, measuring up to 5.4 mm in the anterior left upper lobe.  Dominant 10.8  mm irregular nodule in the posterior right lower lobe (image 151), suspicious.  No pleural effusion or pneumothorax.  Upper Abdomen: Visualized upper abdomen is grossly unremarkable, noting vascular calcifications.  Musculoskeletal: Mild degenerative changes of the mid thoracic spine.  IMPRESSION: Lung-RADS 4A, suspicious. Follow up low-dose chest CT without contrast in 3 months (please use the following order, "CT CHEST LCS NODULE FOLLOW-UP W/O CM") is recommended. Alternatively, PET may be considered.  10.8 mm irregular nodule in the posterior right lower lobe.  These results will be called to the ordering clinician or representative by the Radiologist Assistant, and communication documented in the PACS or [employer].  Aortic Atherosclerosis (ICD10-I70.0) and Emphysema (ICD10-J43.9).   Electronically Signed   By: Julian Hy M.D.   On: 02/03/2020 14:25 NUCLEAR MEDICINE PET SKULL BASE TO THIGH  TECHNIQUE: 7.6 mCi F-18 FDG was injected intravenously. Full-ring PET imaging was performed from the skull base to thigh after the radiotracer. CT data was obtained and used for attenuation correction and anatomic localization.  Fasting blood glucose: 91 mg/dl  COMPARISON:  CT chest 02/03/2020.  FINDINGS: Mediastinal blood pool activity: SUV max 2.6  Liver activity: SUV max NA  NECK:  No abnormal hypermetabolism.  Incidental CT findings:  None.  CHEST:  Mildly  hypermetabolic nonenlarged lymph nodes in the left axilla, compatible with COVID vaccination 02/02/2020. No abnormal hypermetabolic mediastinal, hilar or axillary lymph nodes. A spiculated nodule in the peripheral right lower lobe measures 9 mm (8/37) with an SUV max of 5.1. No additional hypermetabolic pulmonary nodules.  Incidental CT findings:  Atherosclerotic calcification of the aorta, aortic valve and coronary arteries. Pulmonic trunk is enlarged. Heart size normal. No pericardial or pleural effusion. Centrilobular emphysema. Additional small bilateral pulmonary nodules, better evaluated on 02/03/2020.  ABDOMEN/PELVIS:  No abnormal hypermetabolism in the liver, adrenal glands, spleen or pancreas. No hypermetabolic lymph nodes.  Incidental CT findings:  Liver, gallbladder, adrenal glands, kidneys, spleen, pancreas, stomach and bowel are grossly unremarkable. Atherosclerotic calcification of the aorta without aneurysm.  SKELETON:  No abnormal osseous hypermetabolism.  Incidental CT findings:  Degenerative changes in the spine.  IMPRESSION: 1. 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 2. Additional small bilateral pulmonary nodules, better evaluated on 02/03/2020. 3. Aortic atherosclerosis (ICD10-I70.0). Coronary artery calcification. 4. Enlarged pulmonic trunk, indicative of pulmonary arterial hypertension. 5.  Emphysema (ICD10-J43.9).   Electronically Signed   By: Lorin Picket M.D.   On: 02/11/2020 16:18 I personally reviewed the CT and PET/CT images and concur with the findings noted above.  Pulmonary function testing FVC 2.05 (76%) FEV1 1.02 (50%) FEV1 0.79 (39%) postbronchodilator??? TLC 6.68 (136%) DLCO 15.16 (82%)  Impression: Khiley Lieser is a 74 year old woman with a history of tobacco abuse, hypertension, hyperlipidemia, and depression.  She has been evaluated for  chronic cough.  She had a low-dose CT for lung cancer screening which showed a 1 cm right lower lobe nodule.  That nodule is hypermetabolic on PET/CT.  Lung nodule-given the size, appearance, age, and smoking history this is most likely a new primary bronchogenic carcinoma.  Also highly suspicious given the significant amount of metabolic activity for the small size of the nodule.  No evidence of regional or distant metastases.  Clinical stage is T1, N0, 1A.  Infectious and inflammatory nodules are also in the differential, but are far less likely.  I had a long discussion with Mrs. Buras and her husband.  We discussed potential treatment with radiation  versus surgical resection.  I informed him of the advantages and disadvantages of each of those approaches.  She strongly favors surgical resection.    She might have difficulty with a lobectomy, but I think she can tolerate a segmentectomy without undue difficulty based on her reported functional status.  I am not sure about her PFTs.  She describes some difficulty with communication about when inhale and exhale and I think they may be artificially low.  I do think she has some COPD, but I do not think it is prohibitive for segmental resection.  I described the proposed operation of a robotic right lower lobe superior segmentectomy.  I informed them of the general nature of the procedure including the incisions to be used, the need for general anesthesia, the use of drains to postoperatively, the expected hospital stay, and the overall recovery.  I informed him of the indications, risk, benefits, and alternatives.  They understand the risks include, but not limited to death, MI, DVT, PE, bleeding, possible need for transfusion, infection, prolonged air leak, cardiac arrhythmias, chronic pain, as well as possibility of other unforeseeable complications.  She accepts the risks and wishes to proceed.  CAD-coronary calcification noted on CT.  No anginal  symptoms.  No need for further investigation at this point.  Aortic atherosclerosis-also noted on CT chest.  Asymptomatic.  Possible pulmonary hypertension-enlarged main PA on CT.  No signs or symptoms of right heart failure.  Tobacco abuse-I strongly emphasized the importance of tobacco cessation.  She expresses willingness to try to quit, although so far she is only tried to "cut down."  Plan: Robotic right lower lobe superior segmentectomy on Wednesday, March 17, 2020  [name], MD [facility] [phone]

## 2020-02-24 NOTE — Progress Notes (Signed)
PCP is [name], MD Referring Provider is Laurin Coder, MD  Chief Complaint  Patient presents with  . Lung Lesion    Surgical eval, PET Scan 02/11/20, Chest CT 02/03/20,  PFT's 02/19/20    HPI: Amy [name] sent for consultation regarding a right lower lobe lung nodule.  Amy [name] is a 74 year old woman with a past medical history significant for hypertension, hyperlipidemia, depression, stage III chronic kidney disease, coronary atherosclerosis on CT, and ongoing tobacco abuse.  She began smoking around 74 years of age and smoked about a pack a day prior to last year or 2 when she cut down to about 1/2 pack/day.  She complained of a chronic cough.  She was referred to Dr. Ander Slade.  Given her age and smoking history she had a low-dose CT done for lung cancer screening.  That showed a spiculated right lower lobe lung nodule.  A PET/CT showed nodule was hypermetabolic with an SUV of 5.1.  She continues to smoke about 1/2 pack/day.  She denies any chest pain, pressure, or tightness.  She does have a chronic cough.  Occasionally brings up clear phlegm.  She can walk through the grocery store around [employer] without any shortness of breath.  She can walk up a flight of stairs without stopping.  She had some communication issues with the person doing her pulmonary function testing.  No change in appetite or weight loss.  No unusual headaches or visual changes.  No new bone or joint pain.  Zubrod Score: At the time of surgery this patient's most appropriate activity status/level should be described as: []     0    Normal activity, no symptoms [x]     1    Restricted in physical strenuous activity but ambulatory, able to do out light work []     2    Ambulatory and capable of self care, unable to do work activities, up and about >50 % of waking hours                              []     3    Only limited self care, in bed greater than 50% of waking hours []     4    Completely disabled, no  self care, confined to bed or chair []     5    Moribund  Past Medical History:  Diagnosis Date  . Depression 01/28/2020  . Hyperlipidemia   . Hypertension     Past Surgical History:  Procedure Laterality Date  . APPENDECTOMY    . TUBAL LIGATION      History reviewed. No pertinent family history.  Social History Social History   Tobacco Use  . Smoking status: Current Every Day Smoker    Packs/day: 1.00    Years: 55.00    Pack years: 55.00    Types: Cigarettes  . Smokeless tobacco: Never Used  Substance Use Topics  . Alcohol use: Not on file  . Drug use: Not on file    Current Outpatient Medications  Medication Sig Dispense Refill  . albuterol (VENTOLIN HFA) 108 (90 Base) MCG/ACT inhaler Inhale into the lungs every 6 (six) hours as needed for wheezing or shortness of breath.    . [employer] TURMERIC CURCUMIN PO Take 450 mg by mouth in the morning and at bedtime.    Marland Kitchen lisinopril (ZESTRIL) 20 MG tablet Take 20 mg by mouth daily.    Marland Kitchen  magnesium oxide (MAG-OX) 400 MG tablet Take 400 mg by mouth daily.    Marland Kitchen omega-3 acid ethyl esters (LOVAZA) 1 g capsule Take 1,600 mg by mouth 2 (two) times daily. Omega 3-6-9    . raloxifene (EVISTA) 60 MG tablet Take 60 mg by mouth daily.    . sertraline (ZOLOFT) 50 MG tablet Take 50 mg by mouth daily.    . simvastatin (ZOCOR) 40 MG tablet Take 40 mg by mouth daily.     No current facility-administered medications for this visit.    No Known Allergies  Review of Systems  Constitutional: Negative for activity change, appetite change and unexpected weight change.  HENT: Positive for hearing loss. Negative for trouble swallowing and voice change.   Eyes: Negative for visual disturbance.  Respiratory: Positive for cough. Negative for wheezing.   Cardiovascular: Negative for chest pain and leg swelling.  Gastrointestinal: Negative for abdominal distention and abdominal pain.  Genitourinary: Negative for difficulty urinating and dysuria.   Musculoskeletal: Positive for arthralgias and joint swelling.  Neurological: Negative for seizures, syncope and weakness.  Hematological: Negative for adenopathy. Bruises/bleeds easily.  All other systems reviewed and are negative.   BP 131/72 (BP Location: Right Arm, Patient Position: Sitting, Cuff Size: Normal)   Pulse 81   Temp 98.1 F (36.7 C) (Temporal)   Resp 20   Ht 5\' 4"  (1.626 m)   Wt 148 lb 8 oz (67.4 kg)   SpO2 97% Comment: RA  BMI 25.49 kg/m  Physical Exam Constitutional:      General: She is not in acute distress.    Appearance: Normal appearance.  HENT:     Head: Normocephalic and atraumatic.  Eyes:     General: No scleral icterus.    Extraocular Movements: Extraocular movements intact.  Neck:     Vascular: No carotid bruit.  Cardiovascular:     Rate and Rhythm: Normal rate and regular rhythm.     Heart sounds: Normal heart sounds. No murmur. No friction rub. No gallop.   Pulmonary:     Effort: Pulmonary effort is normal. No respiratory distress.     Breath sounds: Normal breath sounds. No wheezing or rales.  Abdominal:     General: There is no distension.     Palpations: Abdomen is soft.     Tenderness: There is no abdominal tenderness.  Musculoskeletal:     Cervical back: Neck supple.  Lymphadenopathy:     Cervical: No cervical adenopathy.  Skin:    General: Skin is warm and dry.  Neurological:     General: No focal deficit present.     Mental Status: She is alert and oriented to person, place, and time.     Cranial Nerves: No cranial nerve deficit.     Motor: No weakness.     Gait: Gait normal.    Diagnostic Tests: CT CHEST WITHOUT CONTRAST LOW-DOSE FOR LUNG CANCER SCREENING  TECHNIQUE: Multidetector CT imaging of the chest was performed following the standard protocol without IV contrast.  COMPARISON:  None.  FINDINGS: Cardiovascular: Heart is normal in size.  No pericardial effusion.  No evidence of thoracic aortic aneurysm.  Atherosclerotic calcifications of the aortic arch.  Three vessel coronary atherosclerosis.  Mediastinum/Nodes: No suspicious mediastinal lymphadenopathy.  Visualized thyroid is unremarkable.  Lungs/Pleura: Biapical pleural-parenchymal scarring.  Mild centrilobular and paraseptal emphysematous changes, upper lung predominant.  No focal consolidation.  Small bilateral pulmonary nodules, measuring up to 5.4 mm in the anterior left upper lobe.  Dominant 10.8  mm irregular nodule in the posterior right lower lobe (image 151), suspicious.  No pleural effusion or pneumothorax.  Upper Abdomen: Visualized upper abdomen is grossly unremarkable, noting vascular calcifications.  Musculoskeletal: Mild degenerative changes of the mid thoracic spine.  IMPRESSION: Lung-RADS 4A, suspicious. Follow up low-dose chest CT without contrast in 3 months (please use the following order, "CT CHEST LCS NODULE FOLLOW-UP W/O CM") is recommended. Alternatively, PET may be considered.  10.8 mm irregular nodule in the posterior right lower lobe.  These results will be called to the ordering clinician or representative by the Radiologist Assistant, and communication documented in the PACS or [employer].  Aortic Atherosclerosis (ICD10-I70.0) and Emphysema (ICD10-J43.9).   Electronically Signed   By: Julian Hy M.D.   On: 02/03/2020 14:25 NUCLEAR MEDICINE PET SKULL BASE TO THIGH  TECHNIQUE: 7.6 mCi F-18 FDG was injected intravenously. Full-ring PET imaging was performed from the skull base to thigh after the radiotracer. CT data was obtained and used for attenuation correction and anatomic localization.  Fasting blood glucose: 91 mg/dl  COMPARISON:  CT chest 02/03/2020.  FINDINGS: Mediastinal blood pool activity: SUV max 2.6  Liver activity: SUV max NA  NECK:  No abnormal hypermetabolism.  Incidental CT findings:  None.  CHEST:  Mildly  hypermetabolic nonenlarged lymph nodes in the left axilla, compatible with COVID vaccination 02/02/2020. No abnormal hypermetabolic mediastinal, hilar or axillary lymph nodes. A spiculated nodule in the peripheral right lower lobe measures 9 mm (8/37) with an SUV max of 5.1. No additional hypermetabolic pulmonary nodules.  Incidental CT findings:  Atherosclerotic calcification of the aorta, aortic valve and coronary arteries. Pulmonic trunk is enlarged. Heart size normal. No pericardial or pleural effusion. Centrilobular emphysema. Additional small bilateral pulmonary nodules, better evaluated on 02/03/2020.  ABDOMEN/PELVIS:  No abnormal hypermetabolism in the liver, adrenal glands, spleen or pancreas. No hypermetabolic lymph nodes.  Incidental CT findings:  Liver, gallbladder, adrenal glands, kidneys, spleen, pancreas, stomach and bowel are grossly unremarkable. Atherosclerotic calcification of the aorta without aneurysm.  SKELETON:  No abnormal osseous hypermetabolism.  Incidental CT findings:  Degenerative changes in the spine.  IMPRESSION: 1. 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 2. Additional small bilateral pulmonary nodules, better evaluated on 02/03/2020. 3. Aortic atherosclerosis (ICD10-I70.0). Coronary artery calcification. 4. Enlarged pulmonic trunk, indicative of pulmonary arterial hypertension. 5.  Emphysema (ICD10-J43.9).   Electronically Signed   By: Lorin Picket M.D.   On: 02/11/2020 16:18 I personally reviewed the CT and PET/CT images and concur with the findings noted above.  Pulmonary function testing FVC 2.05 (76%) FEV1 1.02 (50%) FEV1 0.79 (39%) postbronchodilator??? TLC 6.68 (136%) DLCO 15.16 (82%)  Impression: Amy [name] is a 74 year old woman with a history of tobacco abuse, hypertension, hyperlipidemia, and depression.  She has been evaluated for  chronic cough.  She had a low-dose CT for lung cancer screening which showed a 1 cm right lower lobe nodule.  That nodule is hypermetabolic on PET/CT.  Lung nodule-given the size, appearance, age, and smoking history this is most likely a new primary bronchogenic carcinoma.  Also highly suspicious given the significant amount of metabolic activity for the small size of the nodule.  No evidence of regional or distant metastases.  Clinical stage is T1, N0, 1A.  Infectious and inflammatory nodules are also in the differential, but are far less likely.  I had a long discussion with Amy [name] and her husband.  We discussed potential treatment with radiation  versus surgical resection.  I informed him of the advantages and disadvantages of each of those approaches.  She strongly favors surgical resection.    She might have difficulty with a lobectomy, but I think she can tolerate a segmentectomy without undue difficulty based on her reported functional status.  I am not sure about her PFTs.  She describes some difficulty with communication about when inhale and exhale and I think they may be artificially low.  I do think she has some COPD, but I do not think it is prohibitive for segmental resection.  I described the proposed operation of a robotic right lower lobe superior segmentectomy.  I informed them of the general nature of the procedure including the incisions to be used, the need for general anesthesia, the use of drains to postoperatively, the expected hospital stay, and the overall recovery.  I informed him of the indications, risk, benefits, and alternatives.  They understand the risks include, but not limited to death, MI, DVT, PE, bleeding, possible need for transfusion, infection, prolonged air leak, cardiac arrhythmias, chronic pain, as well as possibility of other unforeseeable complications.  She accepts the risks and wishes to proceed.  CAD-coronary calcification noted on CT.  No anginal  symptoms.  No need for further investigation at this point.  Aortic atherosclerosis-also noted on CT chest.  Asymptomatic.  Possible pulmonary hypertension-enlarged main PA on CT.  No signs or symptoms of right heart failure.  Tobacco abuse-I strongly emphasized the importance of tobacco cessation.  She expresses willingness to try to quit, although so far she is only tried to "cut down."  Plan: Robotic right lower lobe superior segmentectomy on Wednesday, March 17, 2020  [name], MD [facility] [phone]

## 2020-03-12 ENCOUNTER — Encounter (HOSPITAL_COMMUNITY)
Admission: RE | Admit: 2020-03-12 | Discharge: 2020-03-12 | Disposition: A | Discharge status: Home / Self Care | Payer: Medicare HMO | Source: Ambulatory Visit | Attending: Thoracic Surgery (Cardiothoracic Vascular Surgery) | Admitting: Thoracic Surgery (Cardiothoracic Vascular Surgery)

## 2020-03-12 ENCOUNTER — Encounter (HOSPITAL_COMMUNITY): Payer: Self-pay

## 2020-03-12 ENCOUNTER — Other Ambulatory Visit: Payer: Self-pay

## 2020-03-12 DIAGNOSIS — R911 Solitary pulmonary nodule: Secondary | ICD-10-CM

## 2020-03-12 HISTORY — DX: Anxiety disorder, unspecified: F41.9

## 2020-03-12 HISTORY — DX: Pneumonia, unspecified organism: J18.9

## 2020-03-12 LAB — COMPREHENSIVE METABOLIC PANEL
ALT: 16 U/L (ref 0–44)
AST: 20 U/L (ref 15–41)
Albumin: 3.9 g/dL (ref 3.5–5.0)
Alkaline Phosphatase: 63 U/L (ref 38–126)
Anion gap: 9 (ref 5–15)
BUN: 25 mg/dL — ABNORMAL HIGH (ref 8–23)
CO2: 24 mmol/L (ref 22–32)
Calcium: 9 mg/dL (ref 8.9–10.3)
Chloride: 105 mmol/L (ref 98–111)
Creatinine, Ser: 1.12 mg/dL — ABNORMAL HIGH (ref 0.44–1.00)
GFR calc Af Amer: 56 mL/min — ABNORMAL LOW (ref >60)
GFR calc non Af Amer: 48 mL/min — ABNORMAL LOW (ref >60)
Glucose, Bld: 59 mg/dL — ABNORMAL LOW (ref 70–99)
Potassium: 4.1 mmol/L (ref 3.5–5.1)
Sodium: 138 mmol/L (ref 135–145)
Total Bilirubin: 0.5 mg/dL (ref 0.3–1.2)
Total Protein: 6.5 g/dL (ref 6.5–8.1)

## 2020-03-12 LAB — ABO/RH: ABO/RH(D): O NEG

## 2020-03-12 LAB — URINALYSIS, ROUTINE W REFLEX MICROSCOPIC
Bilirubin Urine: NEGATIVE
Glucose, UA: NEGATIVE mg/dL
Hgb urine dipstick: NEGATIVE
Ketones, ur: NEGATIVE mg/dL
Leukocytes,Ua: NEGATIVE
Nitrite: NEGATIVE
Protein, ur: NEGATIVE mg/dL
Specific Gravity, Urine: 1.005 (ref 1.005–1.030)
pH: 6 (ref 5.0–8.0)

## 2020-03-12 LAB — CBC
HCT: 42.2 % (ref 36.0–46.0)
Hemoglobin: 13.6 g/dL (ref 12.0–15.0)
MCH: 32 pg (ref 26.0–34.0)
MCHC: 32.2 g/dL (ref 30.0–36.0)
MCV: 99.3 fL (ref 80.0–100.0)
Platelets: 232 10*3/uL (ref 150–400)
RBC: 4.25 MIL/uL (ref 3.87–5.11)
RDW: 12.4 % (ref 11.5–15.5)
WBC: 9.1 10*3/uL (ref 4.0–10.5)
nRBC: 0 % (ref 0.0–0.2)

## 2020-03-12 LAB — APTT: aPTT: 28 seconds (ref 24–36)

## 2020-03-12 LAB — PROTIME-INR
INR: 1 (ref 0.8–1.2)
Prothrombin Time: 12.3 seconds (ref 11.4–15.2)

## 2020-03-12 LAB — SURGICAL PCR SCREEN
MRSA, PCR: NEGATIVE
Staphylococcus aureus: NEGATIVE

## 2020-03-12 LAB — TYPE AND SCREEN
ABO/RH(D): O NEG
Antibody Screen: NEGATIVE

## 2020-03-12 NOTE — Progress Notes (Signed)
Unable to obtain ABG in pre-admissions per lab. New order placed to obtain on DOS.

## 2020-03-12 NOTE — Pre-Procedure Instructions (Signed)
[facility] [address] [place] [address] Phone: [phone] Fax: [phone]     Your procedure is scheduled on Wednesday, June 2nd, 2021 from 08:30 AM- 12:00 PM.  Report to [facility] Entrance "A" at 06:30 A.M., and check in at the Admitting office.  Call this number if you have problems the morning of surgery:  [phone]  Call [phone] if you have any questions prior to your surgery date Monday-Friday 8am-4pm.    Remember:  Do not eat or drink after midnight the night before your surgery.     Take these medicines the morning of surgery with A SIP OF WATER: sertraline (ZOLOFT)  IF NEEDED: albuterol (VENTOLIN HFA) inhaler   As of today, STOP taking any Aspirin (unless otherwise instructed by your surgeon) and Aspirin containing products, Aleve, Naproxen, Ibuprofen, Motrin, Advil, Goody's, BC's, all herbal medications, fish oil, and all vitamins.                      Do not wear jewelry, make up, or nail polish.            Do not wear lotions, powders, perfumes, or deodorant.            Do not shave 48 hours prior to surgery.              Do not bring valuables to the hospital.            [facility] is not responsible for any belongings or valuables.  Do NOT Smoke (Tobacco/Vapping) or drink Alcohol 24 hours prior to your procedure.  If you use a CPAP at night, you may bring all equipment for your overnight stay.   Contacts, glasses, dentures or bridgework may not be worn into surgery.      For patients admitted to the hospital, discharge time will be determined by your treatment team.   Patients discharged the day of surgery will not be allowed to drive home, and someone needs to stay with them for 24 hours.    Special instructions:   - Preparing For Surgery  Before surgery, you can play an important role. Because skin is not sterile, your skin needs to be as free of  germs as possible. You can reduce the number of germs on your skin by washing with CHG (chlorahexidine gluconate) Soap before surgery.  CHG is an antiseptic cleaner which kills germs and bonds with the skin to continue killing germs even after washing.    Oral Hygiene is also important to reduce your risk of infection.  Remember - BRUSH YOUR TEETH THE MORNING OF SURGERY WITH YOUR REGULAR TOOTHPASTE  Please do not use if you have an allergy to CHG or antibacterial soaps. If your skin becomes reddened/irritated stop using the CHG.  Do not shave (including legs and underarms) for at least 48 hours prior to first CHG shower. It is OK to shave your face.  Please follow these instructions carefully.   1. Shower the NIGHT BEFORE SURGERY and the MORNING OF SURGERY with CHG Soap.   2. If you chose to wash your hair, wash your hair first as usual with your normal shampoo.  3. After you shampoo, rinse your hair and body thoroughly to remove the shampoo.  4. Use CHG as you would any other liquid soap. You can apply CHG directly to the skin and wash gently with a scrungie or a  clean washcloth.   5. Apply the CHG Soap to your body ONLY FROM THE NECK DOWN.  Do not use on open wounds or open sores. Avoid contact with your eyes, ears, mouth and genitals (private parts). Wash Face and genitals (private parts)  with your normal soap.   6. Wash thoroughly, paying special attention to the area where your surgery will be performed.  7. Thoroughly rinse your body with warm water from the neck down.  8. DO NOT shower/wash with your normal soap after using and rinsing off the CHG Soap.  9. Pat yourself dry with a CLEAN TOWEL.  10. Wear CLEAN PAJAMAS to bed the night before surgery, wear comfortable clothes the morning of surgery  11. Place CLEAN SHEETS on your bed the night of your first shower and DO NOT SLEEP WITH PETS.   Day of Surgery:   Do not apply any deodorants/lotions.  Please wear clean clothes  to the hospital/surgery center.   Remember to brush your teeth WITH YOUR REGULAR TOOTHPASTE.   Please read over the following fact sheets that you were given.

## 2020-03-12 NOTE — Progress Notes (Signed)
PCP - Dr. [name] with [facility]  Chest x-ray - DOS  EKG - 03/12/20 Stress Test - Denies ECHO - Denies Cardiac Cath - Denies  Sleep Study - Denies   DM - Denies  COVID TEST- 03/13/20  Anesthesia review: No  Patient denies shortness of breath, fever, cough and chest pain at PAT appointment   All instructions explained to the patient, with a verbal understanding of the material. Patient agrees to go over the instructions while at home for a better understanding. Patient also instructed to self quarantine after being tested for COVID-19. The opportunity to ask questions was provided.

## 2020-03-13 ENCOUNTER — Other Ambulatory Visit (HOSPITAL_COMMUNITY)
Admission: RE | Admit: 2020-03-13 | Discharge: 2020-03-13 | Disposition: A | Discharge status: Home / Self Care | Payer: Medicare HMO | Source: Ambulatory Visit | Attending: Thoracic Surgery (Cardiothoracic Vascular Surgery) | Admitting: Thoracic Surgery (Cardiothoracic Vascular Surgery)

## 2020-03-13 DIAGNOSIS — Z01812 Encounter for preprocedural laboratory examination: Secondary | ICD-10-CM | POA: Insufficient documentation

## 2020-03-13 DIAGNOSIS — Z20822 Contact with and (suspected) exposure to covid-19: Secondary | ICD-10-CM | POA: Diagnosis not present

## 2020-03-13 LAB — SARS CORONAVIRUS 2 (TAT 6-24 HRS): SARS Coronavirus 2: NEGATIVE

## 2020-03-16 NOTE — Anesthesia Preprocedure Evaluation (Addendum)
Anesthesia Evaluation  Patient identified by MRN, date of birth, ID band Patient awake    Reviewed: Allergy & Precautions, NPO status , Patient's Chart, lab work & pertinent test results  Airway Mallampati: II  TM Distance: >3 FB Neck ROM: Full    Dental no notable dental hx.    Pulmonary COPD,  COPD inhaler, Current Smoker and Patient abstained from smoking.,    Pulmonary exam normal breath sounds clear to auscultation       Cardiovascular hypertension, Pt. on medications Normal cardiovascular exam Rhythm:Regular Rate:Normal  ECG: NSR, rate 67   Neuro/Psych PSYCHIATRIC DISORDERS Anxiety Depression negative neurological ROS     GI/Hepatic negative GI ROS, Neg liver ROS,   Endo/Other  negative endocrine ROS  Renal/GU negative Renal ROS     Musculoskeletal negative musculoskeletal ROS (+)   Abdominal   Peds  Hematology HLD   Anesthesia Other Findings RLL NODULE  Reproductive/Obstetrics                            Anesthesia Physical Anesthesia Plan  ASA: III  Anesthesia Plan: General   Post-op Pain Management:    Induction: Intravenous  PONV Risk Score and Plan: 2 and Ondansetron, Dexamethasone and Treatment may vary due to age or medical condition  Airway Management Planned: Double Lumen EBT  Additional Equipment: Arterial line  Intra-op Plan:   Post-operative Plan: Extubation in OR  Informed Consent: I have reviewed the patients History and Physical, chart, labs and discussed the procedure including the risks, benefits and alternatives for the proposed anesthesia with the patient or authorized representative who has indicated his/her understanding and acceptance.     Dental advisory given  Plan Discussed with: CRNA  Anesthesia Plan Comments: (Potential central line placement discussed)       Anesthesia Quick Evaluation

## 2020-03-17 ENCOUNTER — Encounter (HOSPITAL_COMMUNITY)
Admission: RE | Disposition: A | Discharge status: Home / Self Care | Payer: Self-pay | Source: Home / Self Care | Attending: Thoracic Surgery (Cardiothoracic Vascular Surgery)

## 2020-03-17 ENCOUNTER — Inpatient Hospital Stay (HOSPITAL_COMMUNITY): Payer: Medicare HMO | Admitting: Anesthesiology

## 2020-03-17 ENCOUNTER — Other Ambulatory Visit: Payer: Self-pay

## 2020-03-17 ENCOUNTER — Inpatient Hospital Stay (HOSPITAL_COMMUNITY): Payer: Medicare HMO

## 2020-03-17 ENCOUNTER — Inpatient Hospital Stay (HOSPITAL_COMMUNITY)
Admission: RE | Admit: 2020-03-17 | Discharge: 2020-03-22 | DRG: 164 | Disposition: A | Discharge status: Home / Self Care | Payer: Medicare HMO | Attending: Thoracic Surgery (Cardiothoracic Vascular Surgery) | Admitting: Thoracic Surgery (Cardiothoracic Vascular Surgery)

## 2020-03-17 ENCOUNTER — Encounter (HOSPITAL_COMMUNITY): Payer: Self-pay | Admitting: Thoracic Surgery (Cardiothoracic Vascular Surgery)

## 2020-03-17 DIAGNOSIS — Z79899 Other long term (current) drug therapy: Secondary | ICD-10-CM

## 2020-03-17 DIAGNOSIS — H9190 Unspecified hearing loss, unspecified ear: Secondary | ICD-10-CM | POA: Diagnosis present

## 2020-03-17 DIAGNOSIS — D62 Acute posthemorrhagic anemia: Secondary | ICD-10-CM | POA: Diagnosis not present

## 2020-03-17 DIAGNOSIS — F17210 Nicotine dependence, cigarettes, uncomplicated: Secondary | ICD-10-CM | POA: Diagnosis present

## 2020-03-17 DIAGNOSIS — C3431 Malignant neoplasm of lower lobe, right bronchus or lung: Principal | ICD-10-CM | POA: Diagnosis present

## 2020-03-17 DIAGNOSIS — J449 Chronic obstructive pulmonary disease, unspecified: Secondary | ICD-10-CM | POA: Diagnosis not present

## 2020-03-17 DIAGNOSIS — R079 Chest pain, unspecified: Secondary | ICD-10-CM | POA: Diagnosis not present

## 2020-03-17 DIAGNOSIS — J432 Centrilobular emphysema: Secondary | ICD-10-CM | POA: Diagnosis present

## 2020-03-17 DIAGNOSIS — J95812 Postprocedural air leak: Secondary | ICD-10-CM | POA: Diagnosis not present

## 2020-03-17 DIAGNOSIS — Z4682 Encounter for fitting and adjustment of non-vascular catheter: Secondary | ICD-10-CM

## 2020-03-17 DIAGNOSIS — R911 Solitary pulmonary nodule: Secondary | ICD-10-CM | POA: Diagnosis not present

## 2020-03-17 DIAGNOSIS — Z01818 Encounter for other preprocedural examination: Secondary | ICD-10-CM | POA: Diagnosis not present

## 2020-03-17 DIAGNOSIS — T8182XA Emphysema (subcutaneous) resulting from a procedure, initial encounter: Secondary | ICD-10-CM | POA: Diagnosis not present

## 2020-03-17 DIAGNOSIS — I700 Atherosclerosis of aorta: Secondary | ICD-10-CM | POA: Diagnosis present

## 2020-03-17 DIAGNOSIS — Y838 Other surgical procedures as the cause of abnormal reaction of the patient, or of later complication, without mention of misadventure at the time of the procedure: Secondary | ICD-10-CM | POA: Diagnosis not present

## 2020-03-17 DIAGNOSIS — N183 Chronic kidney disease, stage 3 unspecified: Secondary | ICD-10-CM | POA: Diagnosis not present

## 2020-03-17 DIAGNOSIS — E785 Hyperlipidemia, unspecified: Secondary | ICD-10-CM | POA: Diagnosis not present

## 2020-03-17 DIAGNOSIS — I129 Hypertensive chronic kidney disease with stage 1 through stage 4 chronic kidney disease, or unspecified chronic kidney disease: Secondary | ICD-10-CM | POA: Diagnosis present

## 2020-03-17 DIAGNOSIS — Z716 Tobacco abuse counseling: Secondary | ICD-10-CM | POA: Diagnosis not present

## 2020-03-17 DIAGNOSIS — I10 Essential (primary) hypertension: Secondary | ICD-10-CM | POA: Diagnosis not present

## 2020-03-17 DIAGNOSIS — I2510 Atherosclerotic heart disease of native coronary artery without angina pectoris: Secondary | ICD-10-CM | POA: Diagnosis present

## 2020-03-17 DIAGNOSIS — J439 Emphysema, unspecified: Secondary | ICD-10-CM | POA: Diagnosis not present

## 2020-03-17 DIAGNOSIS — Z902 Acquired absence of lung [part of]: Secondary | ICD-10-CM

## 2020-03-17 DIAGNOSIS — J984 Other disorders of lung: Secondary | ICD-10-CM | POA: Diagnosis not present

## 2020-03-17 DIAGNOSIS — J939 Pneumothorax, unspecified: Secondary | ICD-10-CM | POA: Diagnosis not present

## 2020-03-17 DIAGNOSIS — S279XXA Injury of unspecified intrathoracic organ, initial encounter: Secondary | ICD-10-CM | POA: Diagnosis not present

## 2020-03-17 DIAGNOSIS — Z09 Encounter for follow-up examination after completed treatment for conditions other than malignant neoplasm: Secondary | ICD-10-CM

## 2020-03-17 DIAGNOSIS — F329 Major depressive disorder, single episode, unspecified: Secondary | ICD-10-CM | POA: Diagnosis present

## 2020-03-17 DIAGNOSIS — R69 Illness, unspecified: Secondary | ICD-10-CM | POA: Diagnosis not present

## 2020-03-17 HISTORY — PX: INTERCOSTAL NERVE BLOCK: SHX5021

## 2020-03-17 HISTORY — PX: NODE DISSECTION: SHX5269

## 2020-03-17 HISTORY — PX: LUNG SURGERY: SHX703

## 2020-03-17 LAB — GLUCOSE, CAPILLARY
Glucose-Capillary: 118 mg/dL — ABNORMAL HIGH (ref 70–99)
Glucose-Capillary: 147 mg/dL — ABNORMAL HIGH (ref 70–99)

## 2020-03-17 LAB — BLOOD GAS, ARTERIAL
Acid-Base Excess: 1.1 mmol/L (ref 0.0–2.0)
Bicarbonate: 25.7 mmol/L (ref 20.0–28.0)
FIO2: 21
O2 Saturation: 96.9 %
Patient temperature: 37
pCO2 arterial: 44.8 mmHg (ref 32.0–48.0)
pH, Arterial: 7.377 (ref 7.350–7.450)
pO2, Arterial: 93.9 mmHg (ref 83.0–108.0)

## 2020-03-17 SURGERY — LOBECTOMY, LUNG, ROBOT-ASSISTED, USING VATS
Anesthesia: General | Site: Chest | Laterality: Right

## 2020-03-17 MED ORDER — ROCURONIUM BROMIDE 10 MG/ML (PF) SYRINGE
PREFILLED_SYRINGE | INTRAVENOUS | Status: DC | PRN
  Administered 2020-03-17: 20 mg via INTRAVENOUS
  Administered 2020-03-17 (×2): 50 mg via INTRAVENOUS

## 2020-03-17 MED ORDER — ORAL CARE MOUTH RINSE: 15.0000 mL | Freq: Once | OROMUCOSAL | Status: AC

## 2020-03-17 MED ORDER — GABAPENTIN 300 MG PO CAPS
ORAL_CAPSULE | ORAL | Status: AC
  Filled 2020-03-17: qty 1

## 2020-03-17 MED ORDER — ONDANSETRON HCL 4 MG/2ML IJ SOLN
INTRAMUSCULAR | Status: DC | PRN
  Administered 2020-03-17: 4 mg via INTRAVENOUS

## 2020-03-17 MED ORDER — SIMVASTATIN 20 MG PO TABS
40.0000 mg | ORAL_TABLET | Freq: Every evening | ORAL | Status: DC
  Administered 2020-03-17 – 2020-03-21 (×5): 40 mg via ORAL
  Filled 2020-03-17 (×6): qty 2

## 2020-03-17 MED ORDER — FENTANYL CITRATE (PF) 250 MCG/5ML IJ SOLN
INTRAMUSCULAR | Status: DC | PRN
  Administered 2020-03-17: 100 ug via INTRAVENOUS
  Administered 2020-03-17 (×3): 50 ug via INTRAVENOUS

## 2020-03-17 MED ORDER — LACTATED RINGERS IV SOLN: INTRAVENOUS | Status: DC | PRN

## 2020-03-17 MED ORDER — INDOCYANINE GREEN 25 MG IV SOLR
INTRAVENOUS | Status: AC
  Filled 2020-03-17: qty 10

## 2020-03-17 MED ORDER — RALOXIFENE HCL 60 MG PO TABS
60.0000 mg | ORAL_TABLET | Freq: Every day | ORAL | Status: DC
  Administered 2020-03-18 – 2020-03-22 (×5): 60 mg via ORAL
  Filled 2020-03-17 (×5): qty 1

## 2020-03-17 MED ORDER — OXYCODONE HCL 5 MG PO TABS: 5.0000 mg | ORAL_TABLET | ORAL | Status: DC | PRN

## 2020-03-17 MED ORDER — ACETAMINOPHEN 500 MG PO TABS
1000.0000 mg | ORAL_TABLET | Freq: Four times a day (QID) | ORAL | Status: DC
  Administered 2020-03-17 – 2020-03-21 (×17): 1000 mg via ORAL
  Filled 2020-03-17 (×17): qty 2

## 2020-03-17 MED ORDER — PROPOFOL 10 MG/ML IV BOLUS
INTRAVENOUS | Status: DC | PRN
  Administered 2020-03-17: 110 mg via INTRAVENOUS
  Administered 2020-03-17: 40 mg via INTRAVENOUS

## 2020-03-17 MED ORDER — PHENYLEPHRINE HCL (PRESSORS) 10 MG/ML IV SOLN
INTRAVENOUS | Status: DC | PRN
  Administered 2020-03-17 (×3): 40 ug via INTRAVENOUS

## 2020-03-17 MED ORDER — ACETAMINOPHEN 500 MG PO TABS
1000.0000 mg | ORAL_TABLET | Freq: Once | ORAL | Status: AC
  Administered 2020-03-17: 1000 mg via ORAL
  Filled 2020-03-17: qty 2

## 2020-03-17 MED ORDER — HEMOSTATIC AGENTS (NO CHARGE) OPTIME
TOPICAL | Status: DC | PRN
  Administered 2020-03-17: 3 via TOPICAL

## 2020-03-17 MED ORDER — LEVALBUTEROL HCL 0.63 MG/3ML IN NEBU
0.6300 mg | INHALATION_SOLUTION | Freq: Three times a day (TID) | RESPIRATORY_TRACT | Status: DC | PRN
  Administered 2020-03-21: 0.63 mg via RESPIRATORY_TRACT
  Filled 2020-03-17: qty 3

## 2020-03-17 MED ORDER — ONDANSETRON HCL 4 MG/2ML IJ SOLN: 4.0000 mg | Freq: Once | INTRAMUSCULAR | Status: DC | PRN

## 2020-03-17 MED ORDER — CEFAZOLIN SODIUM-DEXTROSE 2-4 GM/100ML-% IV SOLN
2.0000 g | Freq: Three times a day (TID) | INTRAVENOUS | Status: AC
  Administered 2020-03-17 – 2020-03-18 (×2): 2 g via INTRAVENOUS
  Filled 2020-03-17 (×2): qty 100

## 2020-03-17 MED ORDER — PHENYLEPHRINE HCL-NACL 10-0.9 MG/250ML-% IV SOLN
INTRAVENOUS | Status: DC | PRN
  Administered 2020-03-17: 30 ug/min via INTRAVENOUS

## 2020-03-17 MED ORDER — ACETAMINOPHEN 160 MG/5ML PO SOLN: 1000.0000 mg | Freq: Four times a day (QID) | ORAL | Status: DC

## 2020-03-17 MED ORDER — BUPIVACAINE LIPOSOME 1.3 % IJ SUSP
20.0000 mL | Freq: Once | INTRAMUSCULAR | Status: DC
  Filled 2020-03-17: qty 20

## 2020-03-17 MED ORDER — BUPIVACAINE HCL (PF) 0.5 % IJ SOLN
INTRAMUSCULAR | Status: AC
  Filled 2020-03-17: qty 30

## 2020-03-17 MED ORDER — INSULIN ASPART 100 UNIT/ML ~~LOC~~ SOLN
0.0000 [IU] | Freq: Four times a day (QID) | SUBCUTANEOUS | Status: DC
  Administered 2020-03-17: 2 [IU] via SUBCUTANEOUS

## 2020-03-17 MED ORDER — SERTRALINE HCL 25 MG PO TABS
50.0000 mg | ORAL_TABLET | Freq: Every day | ORAL | Status: DC
  Administered 2020-03-18 – 2020-03-22 (×5): 50 mg via ORAL
  Filled 2020-03-17 (×5): qty 2

## 2020-03-17 MED ORDER — SODIUM CHLORIDE 0.9 % IV SOLN: INTRAVENOUS | Status: DC | PRN

## 2020-03-17 MED ORDER — INDOCYANINE GREEN 25 MG IV SOLR
INTRAVENOUS | Status: DC | PRN
  Administered 2020-03-17: 15 mg via INTRAVENOUS

## 2020-03-17 MED ORDER — CHLORHEXIDINE GLUCONATE 0.12 % MT SOLN
15.0000 mL | Freq: Once | OROMUCOSAL | Status: AC
  Administered 2020-03-17: 15 mL via OROMUCOSAL
  Filled 2020-03-17: qty 15

## 2020-03-17 MED ORDER — ONDANSETRON HCL 4 MG/2ML IJ SOLN: 4.0000 mg | Freq: Four times a day (QID) | INTRAMUSCULAR | Status: DC | PRN

## 2020-03-17 MED ORDER — PROPOFOL 10 MG/ML IV BOLUS
INTRAVENOUS | Status: AC
  Filled 2020-03-17: qty 20

## 2020-03-17 MED ORDER — CELECOXIB 200 MG PO CAPS
ORAL_CAPSULE | ORAL | Status: AC
  Filled 2020-03-17: qty 2

## 2020-03-17 MED ORDER — FENTANYL CITRATE (PF) 100 MCG/2ML IJ SOLN: 25.0000 ug | INTRAMUSCULAR | Status: DC | PRN

## 2020-03-17 MED ORDER — SODIUM CHLORIDE FLUSH 0.9 % IV SOLN
INTRAVENOUS | Status: DC | PRN
  Administered 2020-03-17: 100 mL

## 2020-03-17 MED ORDER — FENTANYL CITRATE (PF) 250 MCG/5ML IJ SOLN
INTRAMUSCULAR | Status: AC
  Filled 2020-03-17: qty 5

## 2020-03-17 MED ORDER — SUGAMMADEX SODIUM 200 MG/2ML IV SOLN
INTRAVENOUS | Status: DC | PRN
  Administered 2020-03-17: 200 mg via INTRAVENOUS

## 2020-03-17 MED ORDER — 0.9 % SODIUM CHLORIDE (POUR BTL) OPTIME
TOPICAL | Status: DC | PRN
  Administered 2020-03-17: 2000 mL

## 2020-03-17 MED ORDER — MAGNESIUM OXIDE 400 (241.3 MG) MG PO TABS
400.0000 mg | ORAL_TABLET | Freq: Every day | ORAL | Status: DC
  Administered 2020-03-18 – 2020-03-22 (×5): 400 mg via ORAL
  Filled 2020-03-17 (×5): qty 1

## 2020-03-17 MED ORDER — SENNOSIDES-DOCUSATE SODIUM 8.6-50 MG PO TABS
1.0000 | ORAL_TABLET | Freq: Every day | ORAL | Status: DC
  Administered 2020-03-17 – 2020-03-21 (×4): 1 via ORAL
  Filled 2020-03-17 (×4): qty 1

## 2020-03-17 MED ORDER — HEMOSTATIC AGENTS (NO CHARGE) OPTIME
TOPICAL | Status: DC | PRN
  Administered 2020-03-17: 1 via TOPICAL

## 2020-03-17 MED ORDER — EPHEDRINE SULFATE-NACL 50-0.9 MG/10ML-% IV SOSY
PREFILLED_SYRINGE | INTRAVENOUS | Status: DC | PRN
  Administered 2020-03-17: 5 mg via INTRAVENOUS
  Administered 2020-03-17: 10 mg via INTRAVENOUS

## 2020-03-17 MED ORDER — BISACODYL 5 MG PO TBEC
10.0000 mg | DELAYED_RELEASE_TABLET | Freq: Every day | ORAL | Status: DC
  Administered 2020-03-18 – 2020-03-22 (×4): 10 mg via ORAL
  Filled 2020-03-17 (×4): qty 2

## 2020-03-17 MED ORDER — CHLORHEXIDINE GLUCONATE CLOTH 2 % EX PADS
6.0000 | MEDICATED_PAD | Freq: Every day | CUTANEOUS | Status: DC
  Administered 2020-03-18 – 2020-03-22 (×4): 6 via TOPICAL

## 2020-03-17 MED ORDER — DEXAMETHASONE SODIUM PHOSPHATE 10 MG/ML IJ SOLN
INTRAMUSCULAR | Status: DC | PRN
  Administered 2020-03-17: 10 mg via INTRAVENOUS

## 2020-03-17 MED ORDER — SODIUM CHLORIDE 0.9 % IR SOLN
Status: DC | PRN
  Administered 2020-03-17: 1000 mL

## 2020-03-17 MED ORDER — LIDOCAINE 2% (20 MG/ML) 5 ML SYRINGE
INTRAMUSCULAR | Status: DC | PRN
  Administered 2020-03-17: 100 mg via INTRAVENOUS

## 2020-03-17 MED ORDER — TRAMADOL HCL 50 MG PO TABS
50.0000 mg | ORAL_TABLET | Freq: Four times a day (QID) | ORAL | Status: DC | PRN
  Administered 2020-03-20 – 2020-03-22 (×4): 50 mg via ORAL
  Filled 2020-03-17 (×4): qty 1

## 2020-03-17 MED ORDER — ALBUMIN HUMAN 5 % IV SOLN
INTRAVENOUS | Status: AC
  Filled 2020-03-17: qty 250

## 2020-03-17 MED ORDER — MIDAZOLAM HCL 2 MG/2ML IJ SOLN
INTRAMUSCULAR | Status: AC
  Filled 2020-03-17: qty 2

## 2020-03-17 MED ORDER — ENOXAPARIN SODIUM 40 MG/0.4ML ~~LOC~~ SOLN: 40.0000 mg | SUBCUTANEOUS | Status: DC

## 2020-03-17 MED ORDER — ALBUMIN HUMAN 5 % IV SOLN
12.5000 g | Freq: Once | INTRAVENOUS | Status: AC
  Administered 2020-03-17: 12.5 g via INTRAVENOUS

## 2020-03-17 MED ORDER — CEFAZOLIN SODIUM-DEXTROSE 2-4 GM/100ML-% IV SOLN
2.0000 g | INTRAVENOUS | Status: AC
  Administered 2020-03-17: 2 g via INTRAVENOUS
  Filled 2020-03-17: qty 100

## 2020-03-17 MED ORDER — KETOROLAC TROMETHAMINE 15 MG/ML IJ SOLN
15.0000 mg | Freq: Four times a day (QID) | INTRAMUSCULAR | Status: DC
  Administered 2020-03-17 – 2020-03-18 (×3): 15 mg via INTRAVENOUS
  Filled 2020-03-17 (×3): qty 1

## 2020-03-17 SURGICAL SUPPLY — 125 items
APPLIER CLIP ROT 10 11.4 M/L (STAPLE)
BLADE CLIPPER SURG (BLADE) IMPLANT
BLADE SURG SZ11 CARB STEEL (BLADE) ×2 IMPLANT
BNDG COHESIVE 6X5 TAN STRL LF (GAUZE/BANDAGES/DRESSINGS) IMPLANT
CANISTER SUCT 3000ML PPV (MISCELLANEOUS) ×4 IMPLANT
CANNULA REDUC XI 12-8 STAPL (CANNULA) ×4
CANNULA REDUCER 12-8 DVNC XI (CANNULA) ×2 IMPLANT
CATH THORACIC 28FR (CATHETERS) ×1 IMPLANT
CATH THORACIC 28FR RT ANG (CATHETERS) IMPLANT
CATH THORACIC 36FR (CATHETERS) IMPLANT
CATH THORACIC 36FR RT ANG (CATHETERS) IMPLANT
CLIP APPLIE ROT 10 11.4 M/L (STAPLE) IMPLANT
CLIP VESOCCLUDE MED 6/CT (CLIP) IMPLANT
CNTNR URN SCR LID CUP LEK RST (MISCELLANEOUS) ×5 IMPLANT
CONN ST 1/4X3/8  BEN (MISCELLANEOUS) ×2
CONN ST 1/4X3/8 BEN (MISCELLANEOUS) IMPLANT
CONN Y 3/8X3/8X3/8  BEN (MISCELLANEOUS) ×2
CONN Y 3/8X3/8X3/8 BEN (MISCELLANEOUS) IMPLANT
CONNECTOR BLAKE 2:1 CARIO BLK (MISCELLANEOUS) ×1 IMPLANT
CONT SPEC 4OZ STRL OR WHT (MISCELLANEOUS) ×16
DEFOGGER SCOPE WARMER CLEARIFY (MISCELLANEOUS) ×2 IMPLANT
DERMABOND ADVANCED (GAUZE/BANDAGES/DRESSINGS) ×1
DERMABOND ADVANCED .7 DNX12 (GAUZE/BANDAGES/DRESSINGS) ×1 IMPLANT
DRAIN CHANNEL 28F RND 3/8 FF (WOUND CARE) ×1 IMPLANT
DRAIN CHANNEL 32F RND 10.7 FF (WOUND CARE) IMPLANT
DRAPE ARM DVNC X/XI (DISPOSABLE) ×4 IMPLANT
DRAPE COLUMN DVNC XI (DISPOSABLE) ×1 IMPLANT
DRAPE CV SPLIT W-CLR ANES SCRN (DRAPES) ×2 IMPLANT
DRAPE DA VINCI XI ARM (DISPOSABLE) ×8
DRAPE DA VINCI XI COLUMN (DISPOSABLE) ×2
DRAPE INCISE IOBAN 66X45 STRL (DRAPES) IMPLANT
DRAPE ORTHO SPLIT 77X108 STRL (DRAPES) ×2
DRAPE SURG ORHT 6 SPLT 77X108 (DRAPES) ×1 IMPLANT
DRAPE WARM FLUID 44X44 (DRAPES) ×2 IMPLANT
ELECT BLADE 6.5 EXT (BLADE) IMPLANT
ELECT REM PT RETURN 9FT ADLT (ELECTROSURGICAL) ×2
ELECTRODE REM PT RTRN 9FT ADLT (ELECTROSURGICAL) ×1 IMPLANT
GAUZE KITTNER 4X5 RF (MISCELLANEOUS) ×4 IMPLANT
GAUZE SPONGE 4X4 12PLY STRL (GAUZE/BANDAGES/DRESSINGS) ×2 IMPLANT
GLOVE TRIUMPH SURG SIZE 7.5 (KITS) ×5 IMPLANT
GOWN STRL REUS W/ TWL LRG LVL3 (GOWN DISPOSABLE) ×2 IMPLANT
GOWN STRL REUS W/ TWL XL LVL3 (GOWN DISPOSABLE) ×3 IMPLANT
GOWN STRL REUS W/TWL 2XL LVL3 (GOWN DISPOSABLE) ×4 IMPLANT
GOWN STRL REUS W/TWL LRG LVL3 (GOWN DISPOSABLE) ×4
GOWN STRL REUS W/TWL XL LVL3 (GOWN DISPOSABLE) ×6
HEMOSTAT SURGICEL 2X14 (HEMOSTASIS) ×6 IMPLANT
IRRIGATION STRYKERFLOW (MISCELLANEOUS) ×1 IMPLANT
IRRIGATOR STRYKERFLOW (MISCELLANEOUS) ×2
KIT BASIN OR (CUSTOM PROCEDURE TRAY) ×2 IMPLANT
KIT SUCTION CATH 14FR (SUCTIONS) IMPLANT
KIT TURNOVER KIT B (KITS) ×2 IMPLANT
LOOP VESSEL SUPERMAXI WHITE (MISCELLANEOUS) IMPLANT
NDL HYPO 25GX1X1/2 BEV (NEEDLE) ×1 IMPLANT
NDL SPNL 22GX3.5 QUINCKE BK (NEEDLE) ×1 IMPLANT
NEEDLE HYPO 25GX1X1/2 BEV (NEEDLE) ×2 IMPLANT
NEEDLE SPNL 22GX3.5 QUINCKE BK (NEEDLE) ×2 IMPLANT
NS IRRIG 1000ML POUR BTL (IV SOLUTION) ×4 IMPLANT
PACK CHEST (CUSTOM PROCEDURE TRAY) ×2 IMPLANT
PAD ARMBOARD 7.5X6 YLW CONV (MISCELLANEOUS) ×4 IMPLANT
PROGEL SPRAY TIP 11IN (MISCELLANEOUS) ×2
RELOAD STAPLE 45 2.5 WHT DVNC (STAPLE) IMPLANT
RELOAD STAPLE 45 3.5 BLU DVNC (STAPLE) IMPLANT
RELOAD STAPLE 45 4.3 GRN DVNC (STAPLE) IMPLANT
RELOAD STAPLE 45 4.6 BLK DVNC (STAPLE) IMPLANT
RELOAD STAPLER 2.5X45 WHT DVNC (STAPLE) ×1 IMPLANT
RELOAD STAPLER 3.5X45 BLU DVNC (STAPLE) ×12 IMPLANT
RELOAD STAPLER 4.3X45 GRN DVNC (STAPLE) ×8 IMPLANT
RELOAD STAPLER 45 4.6 BLK DVNC (STAPLE) ×1 IMPLANT
SCISSORS LAP 5X35 DISP (ENDOMECHANICALS) IMPLANT
SEAL CANN UNIV 5-8 DVNC XI (MISCELLANEOUS) ×2 IMPLANT
SEAL XI 5MM-8MM UNIVERSAL (MISCELLANEOUS) ×4
SEALANT PROGEL (MISCELLANEOUS) ×1 IMPLANT
SEALANT SURG COSEAL 4ML (VASCULAR PRODUCTS) IMPLANT
SEALANT SURG COSEAL 8ML (VASCULAR PRODUCTS) IMPLANT
SET TUBE SMOKE EVAC HIGH FLOW (TUBING) ×2 IMPLANT
SHEARS HARMONIC HDI 20CM (ELECTROSURGICAL) IMPLANT
SOLUTION ELECTROLUBE (MISCELLANEOUS) IMPLANT
SPECIMEN JAR MEDIUM (MISCELLANEOUS) IMPLANT
SPONGE INTESTINAL PEANUT (DISPOSABLE) IMPLANT
SPONGE TONSIL TAPE 1 RFD (DISPOSABLE) IMPLANT
STAPLER 45 SUREFORM CVD (STAPLE)
STAPLER 45 SUREFORM CVD DVNC (STAPLE) IMPLANT
STAPLER CANNULA SEAL DVNC XI (STAPLE) ×2 IMPLANT
STAPLER CANNULA SEAL XI (STAPLE) ×4
STAPLER RELOAD 2.5X45 WHITE (STAPLE) ×2
STAPLER RELOAD 2.5X45 WHT DVNC (STAPLE) ×1
STAPLER RELOAD 3.5X45 BLU DVNC (STAPLE) ×12
STAPLER RELOAD 3.5X45 BLUE (STAPLE) ×24
STAPLER RELOAD 4.3X45 GREEN (STAPLE) ×16
STAPLER RELOAD 4.3X45 GRN DVNC (STAPLE) ×8
STAPLER RELOAD 45 4.6 BLK (STAPLE) ×2
STAPLER RELOAD 45 4.6 BLK DVNC (STAPLE) ×1
SUT PDS AB 3-0 SH 27 (SUTURE) IMPLANT
SUT PROLENE 4 0 RB 1 (SUTURE)
SUT PROLENE 4-0 RB1 .5 CRCL 36 (SUTURE) IMPLANT
SUT SILK  1 MH (SUTURE) ×2
SUT SILK 1 MH (SUTURE) ×1 IMPLANT
SUT SILK 1 TIES 10X30 (SUTURE) ×1 IMPLANT
SUT SILK 2 0 SH (SUTURE) IMPLANT
SUT SILK 2 0SH CR/8 30 (SUTURE) ×1 IMPLANT
SUT SILK 3 0 SH 30 (SUTURE) IMPLANT
SUT SILK 3 0SH CR/8 30 (SUTURE) IMPLANT
SUT VIC AB 1 CTX 36 (SUTURE)
SUT VIC AB 1 CTX36XBRD ANBCTR (SUTURE) IMPLANT
SUT VIC AB 2-0 CTX 36 (SUTURE) IMPLANT
SUT VIC AB 3-0 MH 27 (SUTURE) IMPLANT
SUT VIC AB 3-0 X1 27 (SUTURE) ×3 IMPLANT
SUT VICRYL 0 TIES 12 18 (SUTURE) ×2 IMPLANT
SUT VICRYL 0 UR6 27IN ABS (SUTURE) ×4 IMPLANT
SUT VICRYL 2 TP 1 (SUTURE) IMPLANT
SYR 20ML ECCENTRIC (SYRINGE) ×2 IMPLANT
SYR 30ML LL (SYRINGE) ×2 IMPLANT
SYSTEM RETRIEVAL ANCHOR 8 (MISCELLANEOUS) ×2 IMPLANT
SYSTEM SAHARA CHEST DRAIN ATS (WOUND CARE) ×2 IMPLANT
TAPE CLOTH 4X10 WHT NS (GAUZE/BANDAGES/DRESSINGS) ×2 IMPLANT
TAPE CLOTH SURG 6X10 WHT LF (GAUZE/BANDAGES/DRESSINGS) ×1 IMPLANT
TIP APPLICATOR SPRAY EXTEND 16 (VASCULAR PRODUCTS) IMPLANT
TIP SPRAY PROGEL 11IN (MISCELLANEOUS) IMPLANT
TOWEL GREEN STERILE (TOWEL DISPOSABLE) ×2 IMPLANT
TRAY FOLEY MTR SLVR 16FR STAT (SET/KITS/TRAYS/PACK) ×2 IMPLANT
TRAY WAYNE PNEUMOTHORAX 14X18 (TRAY / TRAY PROCEDURE) ×1 IMPLANT
TROCAR BLADELESS 15MM (ENDOMECHANICALS) IMPLANT
TROCAR ENDOPATH XCEL 12X100 BL (ENDOMECHANICALS) IMPLANT
TROCAR XCEL 12X100 BLDLESS (ENDOMECHANICALS) ×2 IMPLANT
WATER STERILE IRR 1000ML POUR (IV SOLUTION) ×2 IMPLANT

## 2020-03-17 NOTE — Interval H&P Note (Signed)
History and Physical Interval Note:  03/17/2020 8:21 AM  Amy [name]  has presented today for surgery, with the diagnosis of RLL NODULE.  The various methods of treatment have been discussed with the patient and family. After consideration of risks, benefits and other options for treatment, the patient has consented to  Procedure(s): XI ROBOTIC ASSISTED THORASCOPY-RIGHT LOWER LOBE SUPERIOR SEGMENTECTOMY (Right) as a surgical intervention.  The patient's history has been reviewed, patient examined, no change in status, stable for surgery.  I have reviewed the patient's chart and labs.  Questions were answered to the patient's satisfaction.     [name]

## 2020-03-17 NOTE — Brief Op Note (Addendum)
03/17/2020  1:10 PM  PATIENT:  Amy [name]  74 y.o. female  PRE-OPERATIVE DIAGNOSIS:  Right lower lung nodule   POST-OPERATIVE DIAGNOSIS:  Non-small cell carcinoma- Clinical stage IA(T1N0)  PROCEDURE:   RIGHT XI ROBOTIC ASSISTED THORACOSCOPY, RIGHT LOWER LOBE SUPERIOR SEGMENTECTOMY,  LYMPH NODE DISSECTION INTERCOSTAL NERVE BLOCK  SURGEON:  Surgeon(s) and Role:    [name], MD - Primary  PHYSICIAN ASSISTANT: Lars Pinks PA-C  ANESTHESIA:   general  EBL:  100 mL   BLOOD ADMINISTERED:none  DRAINS: Pigtail catheter and 28 Blake drains placed in the right pleural space   LOCAL MEDICATIONS USED:  Exparel  SPECIMEN:  Source of Specimen:  RLL superior segmentectomy with additional margin, multiple lymph nodes  DISPOSITION OF SPECIMEN:  PATHOLOGY  COUNTS CORRECT:  YES  TOURNIQUET:  * No tourniquets in log *  DICTATION: .Dragon Dictation  PLAN OF CARE: Admit to inpatient   PATIENT DISPOSITION:  PACU - hemodynamically stable.   Delay start of Pharmacological VTE agent (>24hrs) due to surgical blood loss or risk of bleeding: no

## 2020-03-17 NOTE — Anesthesia Procedure Notes (Addendum)
Arterial Line Insertion Start/End6/11/2019 8:15 AM, 03/17/2020 8:20 AM Performed by: Janace Litten, CRNA, CRNA  Patient location: Pre-op. Preanesthetic checklist: patient identified, IV checked, risks and benefits discussed, surgical consent and monitors and equipment checked Lidocaine 1% used for infiltration Left, radial was placed Catheter size: 20 Fr Hand hygiene performed , maximum sterile barriers used  and Seldinger technique used  Attempts: 2 Procedure performed without using ultrasound guided technique. Following insertion, dressing applied and Biopatch. Post procedure assessment: normal and unchanged  Patient tolerated the procedure well with no immediate complications. Additional procedure comments: Placed by D. [place], [place] .

## 2020-03-17 NOTE — Plan of Care (Signed)
Initiated care plan Problem: Education: Goal: Knowledge of General Education information will improve Description: Including pain rating scale, medication(s)/side effects and non-pharmacologic comfort measures Outcome: Progressing   Problem: Health Behavior/Discharge Planning: Goal: Ability to manage health-related needs will improve Outcome: Progressing   Problem: Clinical Measurements: Goal: Ability to maintain clinical measurements within normal limits will improve Outcome: Progressing Goal: Will remain free from infection Outcome: Progressing Goal: Diagnostic test results will improve Outcome: Progressing Goal: Respiratory complications will improve Outcome: Progressing Goal: Cardiovascular complication will be avoided Outcome: Progressing   Problem: Activity: Goal: Risk for activity intolerance will decrease Outcome: Progressing   Problem: Nutrition: Goal: Adequate nutrition will be maintained Outcome: Progressing   Problem: Coping: Goal: Level of anxiety will decrease Outcome: Progressing   Problem: Elimination: Goal: Will not experience complications related to bowel motility Outcome: Progressing Goal: Will not experience complications related to urinary retention Outcome: Progressing   Problem: Pain Managment: Goal: General experience of comfort will improve Outcome: Progressing   Problem: Safety: Goal: Ability to remain free from injury will improve Outcome: Progressing   Problem: Skin Integrity: Goal: Risk for impaired skin integrity will decrease Outcome: Progressing   Problem: Education: Goal: Knowledge of disease or condition will improve Outcome: Progressing Goal: Knowledge of the prescribed therapeutic regimen will improve Outcome: Progressing   Problem: Activity: Goal: Risk for activity intolerance will decrease Outcome: Progressing   Problem: Cardiac: Goal: Will achieve and/or maintain hemodynamic stability Outcome: Progressing    Problem: Clinical Measurements: Goal: Postoperative complications will be avoided or minimized Outcome: Progressing   Problem: Respiratory: Goal: Respiratory status will improve Outcome: Progressing   Problem: Pain Management: Goal: Pain level will decrease Outcome: Progressing   Problem: Skin Integrity: Goal: Wound healing without signs and symptoms infection will improve Outcome: Progressing

## 2020-03-17 NOTE — Anesthesia Postprocedure Evaluation (Signed)
Anesthesia Post Note  Patient: Amy [name]  Procedure(s) Performed: XI ROBOTIC ASSISTED THORASCOPY-RIGHT LOWER LOBE SUPERIOR SEGMENTECTOMY (Right Chest) Intercostal Nerve Block (Right Chest) Node Dissection (Right Chest)     Patient location during evaluation: PACU Anesthesia Type: General Level of consciousness: awake Pain management: pain level controlled Vital Signs Assessment: post-procedure vital signs reviewed and stable Respiratory status: spontaneous breathing, nonlabored ventilation, respiratory function stable and patient connected to nasal cannula oxygen Cardiovascular status: blood pressure returned to baseline and stable Postop Assessment: no apparent nausea or vomiting Anesthetic complications: no    Last Vitals:  Vitals:   03/17/20 1745 03/17/20 1800  BP: (!) 118/47 (!) 102/46  Pulse: 82 84  Resp: 18 17  Temp:    SpO2:      Last Pain:  Vitals:   03/17/20 1549  PainSc: 0-No pain                 Harveen Flesch P Stephano Arrants

## 2020-03-17 NOTE — Anesthesia Procedure Notes (Addendum)
Procedure Name: Intubation Date/Time: 03/17/2020 8:42 AM Performed by: Janace Litten, CRNA Pre-anesthesia Checklist: Patient identified, Emergency Drugs available, Suction available and Patient being monitored Patient Re-evaluated:Patient Re-evaluated prior to induction Oxygen Delivery Method: Circle System Utilized Preoxygenation: Pre-oxygenation with 100% oxygen Induction Type: IV induction Ventilation: Mask ventilation without difficulty Laryngoscope Size: Mac and 3 Grade View: Grade I Endobronchial tube: Left, Double lumen EBT and EBT position confirmed by auscultation and 37 Fr Number of attempts: 1 Airway Equipment and Method: Stylet (Camera DLT) Placement Confirmation: ETT inserted through vocal cords under direct vision,  positive ETCO2 and breath sounds checked- equal and bilateral Secured at: 29 (lip) cm Tube secured with: Tape Dental Injury: Teeth and Oropharynx as per pre-operative assessment  Comments: Placed by D. [place], [place]

## 2020-03-17 NOTE — Transfer of Care (Signed)
Immediate Anesthesia Transfer of Care Note  Patient: Amy [name]  Procedure(s) Performed: XI ROBOTIC ASSISTED THORASCOPY-RIGHT LOWER LOBE SUPERIOR SEGMENTECTOMY (Right Chest) Intercostal Nerve Block (Right Chest) Node Dissection (Right Chest)  Patient Location: PACU  Anesthesia Type:General  Level of Consciousness: drowsy and responds to stimulation  Airway & Oxygen Therapy: Patient Spontanous Breathing  Post-op Assessment: Report given to RN and Post -op Vital signs reviewed and stable  Post vital signs: Reviewed and stable  Last Vitals:  Vitals Value Taken Time  BP 97/47 03/17/20 1328  Temp    Pulse 84 03/17/20 1329  Resp 19 03/17/20 1329  SpO2 98 % 03/17/20 1329  Vitals shown include unvalidated device data.  Last Pain:  Vitals:   03/17/20 0745  PainSc: 0-No pain      Patients Stated Pain Goal: 4 (33/38/32 9191)  Complications: No apparent anesthesia complications

## 2020-03-17 NOTE — Op Note (Signed)
NAMEDEBRALEE, [name] MEDICAL RECORD [id number] ACCOUNT [id number] DATE OF BIRTH:02-Oct-1946 FACILITY: [facility] LOCATION: [facility] PHYSICIAN:Amy Lipuma Chaya Jan, MD  OPERATIVE REPORT  DATE OF PROCEDURE:  03/17/2020  PREOPERATIVE DIAGNOSIS:  Right lower lobe lung nodule, suspected non-small cell carcinoma.  POSTOPERATIVE DIAGNOSIS:  Nonsmall cell carcinoma, right lower lobe, clinical stage IA (T1, N0).  PROCEDURES:   1.  Xi Robotic right video-assisted thoracoscopy.  2.  Right lower lobe superior segmentectomy.  3.  Lymph node dissection.   4.  Intercostal nerve block levels 3 through 10.  SURGEON:  Modesto Charon, MD  ASSISTANT:  Lars Pinks, PA-C  ANESTHESIA:  General.  FINDINGS:  Incomplete fissures.  Nodule not in initial segmentectomy specimen.  Nodule in the second specimen sent for frozen section.  Frozen section revealed nonsmall cell carcinoma.  CLINICAL NOTE:  Amy [name] is a 74 year old woman with a history of tobacco abuse who had a low-dose CT for lung cancer screening.  She was found to have a spiculated right lower lobe lung nodule.  On PET CT, the nodule was hypermetabolic with an SUV of  5.1.  There was no evidence of regional or distant metastatic disease.  She was offered the option of surgical resection.  She was a marginal candidate for a lobectomy, but was felt to be a candidate for a superior segmentectomy.  The indications,  risks, benefits and alternatives were discussed in detail with the patient.  She understood and accepted the risks and agreed to proceed.  OPERATIVE NOTE:  Amy [name] was brought to the preoperative holding area on 03/17/2020.  Anesthesia placed a central venous catheter and an arterial blood pressure monitoring line.  She was taken to the operating room, anesthetized and intubated with a double  lumen endotracheal tube.  Intravenous antibiotics were administered.  A Foley catheter was placed.  Sequential compression  devices were placed on the calves for DVT prophylaxis.  She was placed in a left lateral decubitus position and the right chest  was prepped and draped in the usual sterile fashion.  Single lung ventilation of the left lung was initiated and was tolerated well throughout the procedure.  A timeout was performed.  A solution containing 20 mL of liposomal bupivacaine, 30 mL of 0.5% bupivacaine and 50 mL of saline was prepared.  This was used for local at the incision sites, as well as for the intercostal nerve blocks.  An incision was made  in the 8th interspace in the mid axillary line and an 8 mm port was inserted.  The thoracoscope was advanced into the chest.  After confirming intrapleural placement,  carbon dioxide was insufflated to a pressure of 10 mm Hg.  Twelve mm ports were placed anteriorly and  posteriorly to the camera port and then an additional 8 mm port was placed further posteriorly.  All were in the 8th interspace.  An assistant port was placed in the 10th interspace centered between the 2 anterior ports.  There was some bleeding from  around the ports, but this decreased as the procedure went on.  The lung was retracted superiorly and the inferior pulmonary ligament was divided with bipolar cautery.  No lymph node was identified in the inferior pulmonary ligament.  The pleural  reflection was divided at the hilum posteriorly up to the azygous vein.  All lymph nodes that were encountered during the dissection were removed and sent as separate specimens for permanent pathology.  Attention then was turned to the fissure.   Initially, there  was a relatively clean plane.  As the dissection proceeded, there was an incomplete fissure between the upper lobe and the superior segment.  The pulmonary artery was identified in the fissure.  The basilar segmental branch was identified  initially and then the superior segmental branch was dissected out.  There was a relatively large, but otherwise benign  appearing node behind the bifurcation of the vessels which was dissected out.  Once the superior segmental branch had been freed up of  surrounding tissue, it was divided with the robotic vascular stapler.  The remainder of the major fissure between the superior segment and the upper lobe was completed with sequential firings of the robotic stapler using blue cartridges.  The superior  segmental bronchus was identified.  It was dissected out, encircled, and divided with the stapler using a green cartridge.  The stapler was placed across the bronchus and closed.  A test inflation showed aeration of the remainder of the lower lobe, as  well as the upper and middle lobes.  The stapler was fired, transecting the bronchus.  Fifteen mg of indocyanine green was administered intravenously.  The border of the superior segment was identified and a segmentectomy was completed by dividing the  parenchyma with sequential firings of the robotic stapler using both blue and green cartridges.  The specimen was removed . There was a question of a nodule right on the staple line.  This was sent for frozen section, but returned with no definite tumor  seen.  Decision was made to resect additional tissue to ensure we had the nodule seen on CT.  This was done again with sequential firings of the stapler, beginning medially and working laterally and ensuring that the entire superior segment had been  removed.  The specimen was inspected and there was a nodule present adjacent to the old staple line.  This was marked and the new margin was marked as well.  This was sent for frozen section, which showed nonsmall cell carcinoma.  The upper lobe was  retracted inferiorly.  The pleura overlying the azygos vein was opened and level 4R and 2R nodes were removed.  The chest was copiously irrigated with warm saline.  A test inflation showed some air leakage from along the fissure staple lines, but no leak  from the bronchial stump.  Progel  was applied to the fissure staple lines.  The robot was undocked.  A 28-French Blake drain was placed through the original port incision and directed to the apex.  A 14-French pigtail catheter was placed separately and  directed to the apex as well.  That was placed percutaneously using a modified Seldinger technique with direct visualization.  Both were secured with 0 silk sutures.  Dual-lung ventilation was resumed.  The port sites were closed in standard fashion.   Dermabond was applied.  The chest tubes were placed to Pleur-Evac on waterseal.  The patient was placed back in the supine position.  She was extubated in the operating room and taken to the [facility] in good condition.  VN/NUANCE  D:03/17/2020 T:03/17/2020 JOB:[id number]

## 2020-03-18 ENCOUNTER — Inpatient Hospital Stay (HOSPITAL_COMMUNITY): Payer: Medicare HMO

## 2020-03-18 LAB — BASIC METABOLIC PANEL
Anion gap: 5 (ref 5–15)
BUN: 22 mg/dL (ref 8–23)
CO2: 25 mmol/L (ref 22–32)
Calcium: 8.1 mg/dL — ABNORMAL LOW (ref 8.9–10.3)
Chloride: 104 mmol/L (ref 98–111)
Creatinine, Ser: 0.98 mg/dL (ref 0.44–1.00)
GFR calc Af Amer: >60 mL/min (ref >60)
GFR calc non Af Amer: 56 mL/min — ABNORMAL LOW (ref >60)
Glucose, Bld: 116 mg/dL — ABNORMAL HIGH (ref 70–99)
Potassium: 4.6 mmol/L (ref 3.5–5.1)
Sodium: 134 mmol/L — ABNORMAL LOW (ref 135–145)

## 2020-03-18 LAB — CBC
HCT: 29.7 % — ABNORMAL LOW (ref 36.0–46.0)
Hemoglobin: 9.6 g/dL — ABNORMAL LOW (ref 12.0–15.0)
MCH: 32.2 pg (ref 26.0–34.0)
MCHC: 32.3 g/dL (ref 30.0–36.0)
MCV: 99.7 fL (ref 80.0–100.0)
Platelets: 158 10*3/uL (ref 150–400)
RBC: 2.98 MIL/uL — ABNORMAL LOW (ref 3.87–5.11)
RDW: 12.5 % (ref 11.5–15.5)
WBC: 9.8 10*3/uL (ref 4.0–10.5)
nRBC: 0 % (ref 0.0–0.2)

## 2020-03-18 LAB — POCT I-STAT 7, (LYTES, BLD GAS, ICA,H+H)
Acid-base deficit: 3 mmol/L — ABNORMAL HIGH (ref 0.0–2.0)
Bicarbonate: 28.1 mmol/L — ABNORMAL HIGH (ref 20.0–28.0)
Calcium, Ion: 1.29 mmol/L (ref 1.15–1.40)
HCT: 40 % (ref 36.0–46.0)
Hemoglobin: 13.6 g/dL (ref 12.0–15.0)
O2 Saturation: 96 %
Patient temperature: 36.1
Potassium: 5.2 mmol/L — ABNORMAL HIGH (ref 3.5–5.1)
Sodium: 138 mmol/L (ref 135–145)
TCO2: 31 mmol/L (ref 22–32)
pCO2 arterial: 77.1 mmHg (ref 32.0–48.0)
pH, Arterial: 7.165 — CL (ref 7.350–7.450)
pO2, Arterial: 99 mmHg (ref 83.0–108.0)

## 2020-03-18 LAB — GLUCOSE, CAPILLARY: Glucose-Capillary: 100 mg/dL — ABNORMAL HIGH (ref 70–99)

## 2020-03-18 MED ORDER — ENOXAPARIN SODIUM 40 MG/0.4ML ~~LOC~~ SOLN
40.0000 mg | SUBCUTANEOUS | Status: DC
  Administered 2020-03-20 – 2020-03-22 (×3): 40 mg via SUBCUTANEOUS
  Filled 2020-03-18 (×4): qty 0.4

## 2020-03-18 MED ORDER — KETOROLAC TROMETHAMINE 15 MG/ML IJ SOLN: 15.0000 mg | Freq: Four times a day (QID) | INTRAMUSCULAR | Status: DC | PRN

## 2020-03-18 NOTE — Plan of Care (Signed)
  Problem: Education: Goal: Knowledge of General Education information will improve Description: Including pain rating scale, medication(s)/side effects and non-pharmacologic comfort measures Outcome: Progressing   Problem: Health Behavior/Discharge Planning: Goal: Ability to manage health-related needs will improve Outcome: Progressing   Problem: Clinical Measurements: Goal: Ability to maintain clinical measurements within normal limits will improve Outcome: Progressing Goal: Will remain free from infection Outcome: Progressing Goal: Diagnostic test results will improve Outcome: Progressing Goal: Respiratory complications will improve Outcome: Progressing Goal: Cardiovascular complication will be avoided Outcome: Progressing   Problem: Activity: Goal: Risk for activity intolerance will decrease Outcome: Progressing   Problem: Nutrition: Goal: Adequate nutrition will be maintained Outcome: Progressing   Problem: Coping: Goal: Level of anxiety will decrease Outcome: Progressing   Problem: Elimination: Goal: Will not experience complications related to bowel motility Outcome: Progressing Goal: Will not experience complications related to urinary retention Outcome: Progressing   Problem: Pain Managment: Goal: General experience of comfort will improve Outcome: Progressing   Problem: Safety: Goal: Ability to remain free from injury will improve Outcome: Progressing   Problem: Skin Integrity: Goal: Risk for impaired skin integrity will decrease Outcome: Progressing   Problem: Education: Goal: Knowledge of disease or condition will improve Outcome: Progressing Goal: Knowledge of the prescribed therapeutic regimen will improve Outcome: Progressing   Problem: Respiratory: Goal: Respiratory status will improve Outcome: Progressing

## 2020-03-18 NOTE — Progress Notes (Addendum)
[address]       [place] [address]             [phone]      1 Day Post-Op Procedure(s) (LRB): XI ROBOTIC ASSISTED THORASCOPY-RIGHT LOWER LOBE SUPERIOR SEGMENTECTOMY (Right) Intercostal Nerve Block (Right) Node Dissection (Right) Subjective: No pain  Objective: Vital signs in last 24 hours: Temp:  [97.6 F (36.4 C)-98.5 F (36.9 C)] 98 F (36.7 C) (06/03 0413) Pulse Rate:  [73-86] 74 (06/03 0413) Cardiac Rhythm: Normal sinus rhythm;Heart block (06/02 1900) Resp:  [12-23] 20 (06/03 0413) BP: (87-126)/(32-84) 87/66 (06/03 0413) SpO2:  [96 %-100 %] 99 % (06/03 0413) Arterial Line BP: (93-158)/(28-85) 158/85 (06/03 0413)  Hemodynamic parameters for last 24 hours:    Intake/Output from previous day: 06/02 0701 - 06/03 0700 In: 2344.1 [P.O.:240; I.V.:1500; IV Piggyback:404.1] Out: 1265 [Urine:315; Blood:100; Chest Tube:850] Intake/Output this shift: No intake/output data recorded.  General appearance: alert, cooperative and no distress Heart: regular rate and rhythm Lungs: slightly coarse  Abdomen: benign Extremities: PAS in place Wound: dressings CDI  Lab Results: Recent Labs    03/17/20 1234 03/18/20 0422  WBC  --  9.8  HGB 13.6 9.6*  HCT 40.0 29.7*  PLT  --  158   BMET:  Recent Labs    03/17/20 1234 03/18/20 0422  NA 138 134*  K 5.2* 4.6  CL  --  104  CO2  --  25  GLUCOSE  --  116*  BUN  --  22  CREATININE  --  0.98  CALCIUM  --  8.1*    PT/INR: No results for input(s): LABPROT, INR in the last 72 hours. ABG    Component Value Date/Time   PHART 7.165 (LL) 03/17/2020 1234   HCO3 28.1 (H) 03/17/2020 1234   TCO2 31 03/17/2020 1234   ACIDBASEDEF 3.0 (H) 03/17/2020 1234   O2SAT 96.0 03/17/2020 1234   CBG (last 3)  Recent Labs    03/17/20 1746 03/17/20 2345 03/18/20 0531  GLUCAP 147* 118* 100*    Meds Scheduled Meds: . acetaminophen  1,000 mg Oral Q6H   Or  . acetaminophen (TYLENOL) oral liquid 160 mg/5 mL   1,000 mg Oral Q6H  . bisacodyl  10 mg Oral Daily  . Chlorhexidine Gluconate Cloth  6 each Topical Daily  . enoxaparin (LOVENOX) injection  40 mg Subcutaneous Q24H  . insulin aspart  0-24 Units Subcutaneous Q6H  . ketorolac  15 mg Intravenous Q6H  . magnesium oxide  400 mg Oral Daily  . raloxifene  60 mg Oral Daily  . senna-docusate  1 tablet Oral QHS  . sertraline  50 mg Oral Daily  . simvastatin  40 mg Oral QPM   Continuous Infusions: . sodium chloride     PRN Meds:.Place/Maintain arterial line **AND** sodium chloride, fentaNYL (SUBLIMAZE) injection, levalbuterol, ondansetron (ZOFRAN) IV, oxyCODONE, traMADol  Xrays DG Chest 2 View  Result Date: 03/17/2020 CLINICAL DATA:  Preoperative exam today for lobectomy. Right lower lobe nodule. EXAM: CHEST - 2 VIEW COMPARISON:  Chest radiography 01/28/2020. Recent chest CT and PET scan. FINDINGS: Heart and mediastinal shadows are normal except for aortic atherosclerosis. Slightly prominent interstitial lung markings with pleural and parenchymal scarring at the lung apices and mild emphysematous change. 1 cm nodule on the posterolateral right lower lobe has not changed by radiography. No infiltrate, collapse or effusion. No significant bone finding. IMPRESSION: No change in a posterolateral right lower lobe 1 cm pulmonary  nodule. No active process otherwise. Aortic atherosclerosis. Electronically Signed   By: Nelson Chimes M.D.   On: 03/17/2020 09:53   DG Chest Port 1 View  Result Date: 03/17/2020 CLINICAL DATA:  Pneumothorax EXAM: PORTABLE CHEST 1 VIEW COMPARISON:  Earlier same day FINDINGS: New right apical pneumothorax. There are 2 new chest tubes present on the right. Postoperative changes at the right lung base with suture material. Stable chronic mild interstitial prominence. No pleural effusion. Stable cardiomediastinal contours with normal heart size. Mild subcutaneous emphysema in the right chest. IMPRESSION: Postoperative changes at the right  lung base. New right apical pneumothorax with two chest tubes present. Electronically Signed   By: Macy Mis M.D.   On: 03/17/2020 16:06    Assessment/Plan: S/P Procedure(s) (LRB): XI ROBOTIC ASSISTED THORASCOPY-RIGHT LOWER LOBE SUPERIOR SEGMENTECTOMY (Right) Intercostal Nerve Block (Right) Node Dissection (Right)  1 doing well 2 small space , + air leak, may need to consider placing to suction. Drainage 850 cc post op 3 VSS- BP a little soft at times, d/c aline, encourage po fluids, hold restart lisinopril for now 4 sats good on 0-2 liters Atkins, routine pulm toilet 5 expected ABL anemia- monitor 6 Renal fxn stable 7 BS adeq controlled, not on meds preop or h/o DM, stop BS checks  LOS: 1 day    John Giovanni PA-C Pager [phone] 03/18/2020 Patient seen and examined, agree with above Will  Leave CT on water seal today   Remo Lipps C. Roxan Hockey, MD [facility] [phone]

## 2020-03-19 ENCOUNTER — Inpatient Hospital Stay (HOSPITAL_COMMUNITY): Payer: Medicare HMO

## 2020-03-19 LAB — COMPREHENSIVE METABOLIC PANEL
ALT: 9 U/L (ref 0–44)
AST: 22 U/L (ref 15–41)
Albumin: 2.5 g/dL — ABNORMAL LOW (ref 3.5–5.0)
Alkaline Phosphatase: 42 U/L (ref 38–126)
Anion gap: 6 (ref 5–15)
BUN: 21 mg/dL (ref 8–23)
CO2: 26 mmol/L (ref 22–32)
Calcium: 8.3 mg/dL — ABNORMAL LOW (ref 8.9–10.3)
Chloride: 100 mmol/L (ref 98–111)
Creatinine, Ser: 1.02 mg/dL — ABNORMAL HIGH (ref 0.44–1.00)
GFR calc Af Amer: >60 mL/min (ref >60)
GFR calc non Af Amer: 54 mL/min — ABNORMAL LOW (ref >60)
Glucose, Bld: 106 mg/dL — ABNORMAL HIGH (ref 70–99)
Potassium: 4.4 mmol/L (ref 3.5–5.1)
Sodium: 132 mmol/L — ABNORMAL LOW (ref 135–145)
Total Bilirubin: 0.9 mg/dL (ref 0.3–1.2)
Total Protein: 4.5 g/dL — ABNORMAL LOW (ref 6.5–8.1)

## 2020-03-19 LAB — CBC
HCT: 29.9 % — ABNORMAL LOW (ref 36.0–46.0)
Hemoglobin: 9.8 g/dL — ABNORMAL LOW (ref 12.0–15.0)
MCH: 32.1 pg (ref 26.0–34.0)
MCHC: 32.8 g/dL (ref 30.0–36.0)
MCV: 98 fL (ref 80.0–100.0)
Platelets: 158 10*3/uL (ref 150–400)
RBC: 3.05 MIL/uL — ABNORMAL LOW (ref 3.87–5.11)
RDW: 12.3 % (ref 11.5–15.5)
WBC: 9.7 10*3/uL (ref 4.0–10.5)
nRBC: 0 % (ref 0.0–0.2)

## 2020-03-19 LAB — SURGICAL PATHOLOGY

## 2020-03-19 MED ORDER — GUAIFENESIN ER 600 MG PO TB12
600.0000 mg | ORAL_TABLET | Freq: Two times a day (BID) | ORAL | Status: AC
  Administered 2020-03-19 – 2020-03-21 (×6): 600 mg via ORAL
  Filled 2020-03-19 (×6): qty 1

## 2020-03-19 MED ORDER — KETOROLAC TROMETHAMINE 15 MG/ML IJ SOLN
15.0000 mg | Freq: Four times a day (QID) | INTRAMUSCULAR | Status: AC
  Administered 2020-03-19 (×4): 15 mg via INTRAVENOUS
  Filled 2020-03-19 (×4): qty 1

## 2020-03-19 NOTE — Progress Notes (Signed)
2 Days Post-Op Procedure(s) (LRB): XI ROBOTIC ASSISTED THORASCOPY-RIGHT LOWER LOBE SUPERIOR SEGMENTECTOMY (Right) Intercostal Nerve Block (Right) Node Dissection (Right) Subjective: C/o pain  Objective: Vital signs in last 24 hours: Temp:  [98 F (36.7 C)-99 F (37.2 C)] 98 F (36.7 C) (06/04 0400) Pulse Rate:  [68-87] 74 (06/04 0400) Cardiac Rhythm: Normal sinus rhythm (06/04 0723) Resp:  [17-20] 19 (06/04 0400) BP: (83-107)/(45-69) 107/59 (06/04 0400) SpO2:  [94 %-99 %] 98 % (06/04 0400)  Hemodynamic parameters for last 24 hours:    Intake/Output from previous day: 06/03 0701 - 06/04 0700 In: 1250 [P.O.:1250] Out: 1900 [Urine:1350; Chest Tube:550] Intake/Output this shift: No intake/output data recorded.  General appearance: alert, cooperative and no distress Neurologic: intact Heart: regular rate and rhythm Lungs: diminished breath sounds bibasilar + sub Q air, no air leak  Lab Results: Recent Labs    03/18/20 0422 03/19/20 0240  WBC 9.8 9.7  HGB 9.6* 9.8*  HCT 29.7* 29.9*  PLT 158 158   BMET:  Recent Labs    03/18/20 0422 03/19/20 0240  NA 134* 132*  K 4.6 4.4  CL 104 100  CO2 25 26  GLUCOSE 116* 106*  BUN 22 21  CREATININE 0.98 1.02*  CALCIUM 8.1* 8.3*    PT/INR: No results for input(s): LABPROT, INR in the last 72 hours. ABG    Component Value Date/Time   PHART 7.165 (LL) 03/17/2020 1234   HCO3 28.1 (H) 03/17/2020 1234   TCO2 31 03/17/2020 1234   ACIDBASEDEF 3.0 (H) 03/17/2020 1234   O2SAT 96.0 03/17/2020 1234   CBG (last 3)  Recent Labs    03/17/20 1746 03/17/20 2345 03/18/20 0531  GLUCAP 147* 118* 100*    Assessment/Plan: S/P Procedure(s) (LRB): XI ROBOTIC ASSISTED THORASCOPY-RIGHT LOWER LOBE SUPERIOR SEGMENTECTOMY (Right) Intercostal Nerve Block (Right) Node Dissection (Right) -POD # 2 Increased pain after toradol changed to PRN. Creatinine normal - will change back to a standing dose for another 24 hours Ambulate Some  increase in SQ air but air leak better, keep CT to water seal.  550 ml of serosanguinous drainage, keep CT until < 400 ml/ day SCD + ambulation + enoxaparin for DVT prophylaxis Anemia secondary to ABL- stable, follow   LOS: 2 days    [name] 03/19/2020

## 2020-03-19 NOTE — Plan of Care (Signed)

## 2020-03-19 NOTE — Discharge Summary (Addendum)
Physician Discharge Summary  [name] ID: Amy [name] MRN: [id number] DOB/AGE: September 14, 1946 74 y.o.  Admit date: 03/17/2020 Discharge date: 03/22/2020  Admission Diagnoses: [name] lower lobe pulmonary nodule  Discharge Diagnoses:  1. 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 S/p robotic [name] VATS, RLL superior segmentectomy, LN dissection, and intercostal nerve block   Past Medical History:  Diagnosis Date  . Anxiety   . COPD (chronic obstructive pulmonary disease) ([place]) 2021  . Depression 01/28/2020  . Hyperlipidemia   . Hypertension   . Pneumonia     HPI:at time of consultation   Amy [name] sent for consultation regarding a [name] lower lobe lung nodule.  Amy [name] is a 74 year old woman with a past medical history significant for hypertension, hyperlipidemia, depression, stage III chronic kidney disease, coronary atherosclerosis on CT, and ongoing tobacco abuse.  She began smoking around 74 years of age and smoked about a pack a day prior to last year or 2 when she cut down to about 1/2 pack/day.  She complained of a chronic cough.  She was referred to Dr. Ander Slade.  Given her age and smoking history she had a low-dose CT done for lung cancer screening.  That showed a spiculated [name] lower lobe lung nodule.  A PET/CT showed nodule was hypermetabolic with an SUV of 5.1.  She continues to smoke about 1/2 pack/day.  She denies any chest pain, pressure, or tightness.  She does have a chronic cough.  Occasionally brings up clear phlegm.  She can walk through Amy grocery store around [employer] without any shortness of breath.  She can walk up a flight of stairs without stopping.  She had some communication issues with Amy [name] doing her pulmonary function testing.  No change in appetite or weight loss.  No unusual headaches or visual changes.  No new bone or joint pain.  Dr. Roxan Hockey evaluated Amy [name] and all of her studies and recommended  proceeding with robotic assisted thoracoscopic surgery for resection.  Pathology: MICROSCOPIC DIAGNOSIS:   A. LUNG, [name] LOWER LOBE SUPERIOR SEGMENT, LOBECTOMY:  - Benign lung parenchyma with mild smoking-related injury  - No evidence of malignancy   B. LUNG, [name] LOWER LOBE SUPERIOR SEGMENT, LOBECTOMY:  - Invasive moderately differentiated squamous cell carcinoma, 1.0 cm  - Resection margins are negative for carcinoma  - Visceral pleura is not involved  - Negative for lymphovascular invasion  - See oncology table   C. LYMPH NODE, 11R, EXCISION:  - Lymph node, negative for carcinoma (0/1)   D. LYMPH NODE, 7, EXCISION:  - Lymph node, negative for carcinoma (0/1)   E. LYMPH NODE, 7 #2, EXCISION:  - Lymph node, negative for carcinoma (0/1)   F. LYMPH NODE, 12R, EXCISION:  - Lymph node, negative for carcinoma (0/1)   G. LYMPH NODE, 11R #2, EXCISION:  - Lymph node, negative for carcinoma (0/1)   H. LYMPH NODE, 13R, EXCISION:  - Lymph node, negative for carcinoma (0/1)   I. LYMPH NODE, 4R, EXCISION:  - Lymph node, negative for carcinoma (0/1)   J. LYMPH NODE, 2R, EXCISION:  - Lymph node, negative for carcinoma (0/1)   K. LYMPH NODE, 4R #2, EXCISION:  - Lymph node, negative for carcinoma (0/1)  TNM Code: pT1a, pN0   Discharged Condition: stable  Hospital Course: Amy [name] was admitted electively and on 03/17/2020 was taken Amy operating room where she underwent Amy below described procedure.  She tolerated it well was taken to Amy postanesthesia  care unit in stable condition.  Postoperative hospital course: As dictated by Jadene Pierini PA-C: Amy [name] is overall progressed well.  She has maintained stable vital signs and oxygen has been weaned over time.  She has had a postoperative air leak in Amy chest tube drainage system as well as moderate amount of drainage for Amy first couple days.  This has improved over time.  Chest tubes have been managed in a routine manner.   She does have an expected acute blood loss anemia which is stabilized.  Most recent hemoglobin hematocrit dated 06/04 are stable at 9.8 and 29.9 She is tolerating gradually increasing activities using standard protocols.  Incisions are noted to be healing well without evidence of infection.  She is tolerating diet.    Addendum: She had a small air leak, which did resolve. Chest tube was removed on 03/21/2020. Pigtail chest tube was then removed on 06/07. Follow up chest x ray was stable. She did have serous drainage from [name] chest tube wound. Dressing changes were done (4x4s, abd pad, tape) bid and PRN. Amy rest of Amy wounds are clean, dry, and no sign of infection. [name] is ambulating on room air. She is tolerating a diet. She is felt surgically stable for discharge today.  Consults: None  Significant Diagnostic Studies: routine post op labs and serial CXR's  Treatments: surgery:  OPERATIVE REPORT  DATE OF PROCEDURE:  03/17/2020  PREOPERATIVE DIAGNOSIS:  [name] lower lobe lung nodule, suspected non-small cell carcinoma.  POSTOPERATIVE DIAGNOSIS:  Nonsmall cell carcinoma, [name] lower lobe, clinical stage IA (T1, N0).  PROCEDURES:   1.  Robotic [name] video-assisted thoracoscopy.  2.  [name] lower lobe superior segmentectomy.  3.  Lymph node dissection.   4.  Intercostal nerve block levels 3 through 10.  SURGEON:  Modesto Charon, MD  ASSISTANT:  Lars Pinks, PA-C  ANESTHESIA:  General.  Discharge Exam: Blood pressure 126/71, pulse 85, temperature 98 F (36.7 C), temperature source Oral, resp. rate 17, height 5\' 4"  (1.626 m), weight 69.1 kg, SpO2 91 %. Cardiovascular: RRR Pulmonary: Clear to auscultation on Amy [name] and coarse on Amy [name] Abdomen: Soft, non tender, bowel sounds present. Extremities: No lower extremity edema. Wounds:  No erythema or signs of infection. A fair amount of drainage from [name] chest tube wound. Pigtail chest tube: to water seal, no  air leak  Disposition:    Allergies as of 03/22/2020   No Known Allergies     Medication List    TAKE these medications   albuterol 108 (90 Base) MCG/ACT inhaler Commonly known as: VENTOLIN HFA Inhale 1-2 puffs into Amy lungs every 6 (six) hours as needed for wheezing or shortness of breath.   [employer] TURMERIC CURCUMIN PO Take 450 mg by mouth daily.   lisinopril 20 MG tablet Commonly known as: ZESTRIL Take 20 mg by mouth daily.   magnesium oxide 400 MG tablet Commonly known as: MAG-OX Take 400 mg by mouth daily.   Omega 3-6-9 Caps Take 2 capsules by mouth daily.   raloxifene 60 MG tablet Commonly known as: EVISTA Take 60 mg by mouth daily.   sertraline 50 MG tablet Commonly known as: ZOLOFT Take 50 mg by mouth daily.   simvastatin 40 MG tablet Commonly known as: ZOCOR Take 40 mg by mouth every evening.   traMADol 50 MG tablet Commonly known as: ULTRAM Take 1 tablet (50 mg total) by mouth every 6 (six) hours as needed for severe pain (mild pain).  Follow-up Information    [name], MD. Go on 04/06/2020.   Specialty: Cardiothoracic Surgery Why: PA/LAT CXR to be taken (at [facility] which is in Amy same building as Dr. Leonarda Salon office) on 06/22 at 3:00 pm;Appointment time is at 3:30 pm Contact information: [address] [place] [address] [phone]        [facility]. Go on 04/02/2020.   Specialty: Cardiothoracic Surgery Why: Appointment is with nurse only for chest tube suture removal. Appointment time is at 11:00 am Contact information: [address] [place], [place] [phone]          Signed: Arnoldo Lenis 03/22/2020, 9:03 AM

## 2020-03-20 ENCOUNTER — Inpatient Hospital Stay (HOSPITAL_COMMUNITY): Payer: Medicare HMO

## 2020-03-20 NOTE — Progress Notes (Addendum)
      [address]       [place] [address]             [phone]       3 Days Post-Op Procedure(s) (LRB): XI ROBOTIC ASSISTED THORASCOPY-RIGHT LOWER LOBE SUPERIOR SEGMENTECTOMY (Right) Intercostal Nerve Block (Right) Node Dissection (Right)  Subjective: Patient has pain in right shoulder this am.   Objective: Vital signs in last 24 hours: Temp:  [97.8 F (36.6 C)-98.2 F (36.8 C)] 97.9 F (36.6 C) (06/05 0709) Pulse Rate:  [74-86] 86 (06/05 0709) Cardiac Rhythm: Normal sinus rhythm (06/05 0709) Resp:  [14-20] 20 (06/05 0800) BP: (97-137)/(42-69) 130/66 (06/05 0709) SpO2:  [95 %-100 %] 100 % (06/05 0709)     Intake/Output from previous day: 06/04 0701 - 06/05 0700 In: 1180 [P.O.:1180] Out: 1324 [Urine:950; Chest Tube:374]   Physical Exam:  Cardiovascular: RRR Pulmonary: Clear to auscultation bilaterally Abdomen: Soft, non tender, bowel sounds present. Extremities: No lower extremity edema. Wounds: Clean and dry.  No erythema or signs of infection. Chest Tube: to water seal, very small, intermittent air leak with cough  Lab Results: CBC: Recent Labs    03/18/20 0422 03/19/20 0240  WBC 9.8 9.7  HGB 9.6* 9.8*  HCT 29.7* 29.9*  PLT 158 158   BMET:  Recent Labs    03/18/20 0422 03/19/20 0240  NA 134* 132*  K 4.6 4.4  CL 104 100  CO2 25 26  GLUCOSE 116* 106*  BUN 22 21  CREATININE 0.98 1.02*  CALCIUM 8.1* 8.3*    PT/INR: No results for input(s): LABPROT, INR in the last 72 hours. ABG:  INR: Will add last result for INR, ABG once components are confirmed Will add last 4 CBG results once components are confirmed  Assessment/Plan:  1. CV - SR. Will restart Lisinopril in am. 2.  Pulmonary - Chest tube with 374 cc output last 24 hours. Chest tube is to water seal and there is a very small, intermittent air leak with cough. CXR this am appears stable (small right apical pneumothorax, subcutaneous emphysema). As discussed with Dr.  Prescott Gum, leave chest tubes for today. Check CXR in am. Encourage incentive spirometer. 3. Expected ABL anemia-H and H yesterday 9.8 and 29.9  Donielle M ZimmermanPA-C 03/20/2020,9:39 AM [phone]  Patient examined and today's chest x-ray image personally reviewed. Patient continues to have significant fluid drainage from large tube with small air leak.  Subcutaneous emphysema has stabilized. We will remove large tube in a.m. if fluid drainage shows improvement  patient examined and medical record reviewed,agree with above note. Tharon Aquas Trigt III 03/20/2020

## 2020-03-21 ENCOUNTER — Inpatient Hospital Stay (HOSPITAL_COMMUNITY): Payer: Medicare HMO

## 2020-03-21 NOTE — Progress Notes (Addendum)
      [address]       [place] [address]             [phone]       4 Days Post-Op Procedure(s) (LRB): XI ROBOTIC ASSISTED THORASCOPY-RIGHT LOWER LOBE SUPERIOR SEGMENTECTOMY (Right) Intercostal Nerve Block (Right) Node Dissection (Right)  Subjective: Patient hopes chest tubes come out today.  Objective: Vital signs in last 24 hours: Temp:  [98 F (36.7 C)-98.5 F (36.9 C)] 98 F (36.7 C) (06/06 0733) Pulse Rate:  [71-83] 80 (06/06 0733) Cardiac Rhythm: Normal sinus rhythm (06/06 0759) Resp:  [16-21] 20 (06/06 0733) BP: (105-123)/(48-68) 105/68 (06/06 0733) SpO2:  [95 %-97 %] 97 % (06/06 0733)     Intake/Output from previous day: 06/05 0701 - 06/06 0700 In: 1100 [P.O.:1100] Out: 325 [Chest Tube:325]   Physical Exam:  Cardiovascular: RRR Pulmonary: Clear to auscultation on the left and rub/squeak on the right Abdomen: Soft, non tender, bowel sounds present. Extremities: No lower extremity edema. Wounds: Clean and dry.  No erythema or signs of infection. Chest Tube: to water seal, tidling with cough but NO air leak  Lab Results: CBC: Recent Labs    03/19/20 0240  WBC 9.7  HGB 9.8*  HCT 29.9*  PLT 158   BMET:  Recent Labs    03/19/20 0240  NA 132*  K 4.4  CL 100  CO2 26  GLUCOSE 106*  BUN 21  CREATININE 1.02*  CALCIUM 8.3*    PT/INR: No results for input(s): LABPROT, INR in the last 72 hours. ABG:  INR: Will add last result for INR, ABG once components are confirmed Will add last 4 CBG results once components are confirmed  Assessment/Plan:  1. CV - SR.  2.  Pulmonary - Chest tube with 325 cc output last 24 hours. Chest tube is to water seal and there is a very small, intermittent air leak with cough. CXR this am appears stable (small right apical pneumothorax, stable subcutaneous emphysema). As discussed with Dr. Prescott Gum, will remove chest tube. Pigtail to remain. Check CXR in am. Encourage incentive spirometer. 3.  Expected ABL anemia-H and H yesterday 9.8 and 29.9  Donielle M ZimmermanPA-C 03/21/2020,8:36 AM [phone]  Patient feels improved after removal of large chest tube.  Pigtail catheter remains in place.  Chest x-ray pending in a.m. and hopefully pigtail can be removed patient examined and medical record reviewed,agree with above note. Tharon Aquas Trigt III 03/21/2020

## 2020-03-21 NOTE — Progress Notes (Addendum)
Chest tube with Ysite removed with no complications, no shortness of breath, patient states pain level is zero---pigtail remains per order, chest tube insertion site is covered with gauze dressing, no bleeding at insertion site--chest xray is ordered for 6/7 0600--patient advised to let me know if any shortness of breath develops--slight wheezing was treated with PRN breathing tx at 1800

## 2020-03-22 ENCOUNTER — Inpatient Hospital Stay (HOSPITAL_COMMUNITY): Payer: Medicare HMO

## 2020-03-22 MED ORDER — TRAMADOL HCL 50 MG PO TABS: 50.0000 mg | ORAL_TABLET | Freq: Four times a day (QID) | ORAL | 0 refills | Status: DC | PRN

## 2020-03-22 NOTE — Progress Notes (Addendum)
      [address]       [place] [address]             [phone]       5 Days Post-Op Procedure(s) (LRB): XI ROBOTIC ASSISTED THORASCOPY-RIGHT LOWER LOBE SUPERIOR SEGMENTECTOMY (Right) Intercostal Nerve Block (Right) Node Dissection (Right)  Subjective: Patient states "leaking around back" since chest tube pulled yesterday.  Objective: Vital signs in last 24 hours: Temp:  [98 F (36.7 C)-98.3 F (36.8 C)] 98.2 F (36.8 C) (06/07 0456) Pulse Rate:  [77-85] 80 (06/07 0456) Cardiac Rhythm: Normal sinus rhythm (06/07 0000) Resp:  [15-20] 20 (06/07 0456) BP: (105-153)/(54-72) 153/72 (06/07 0456) SpO2:  [95 %-98 %] 96 % (06/07 0456)     Intake/Output from previous day: 06/06 0701 - 06/07 0700 In: 480 [P.O.:480] Out: 195 [Chest Tube:195]   Physical Exam:  Cardiovascular: RRR Pulmonary: Clear to auscultation on the left and coarse on the right Abdomen: Soft, non tender, bowel sounds present. Extremities: No lower extremity edema. Wounds:  No erythema or signs of infection. A fair amount of drainage from right chest tube wound. Pigtail chest tube: to water seal, no air leak  Lab Results: CBC: No results for input(s): WBC, HGB, HCT, PLT in the last 72 hours. BMET:  No results for input(s): NA, K, CL, CO2, GLUCOSE, BUN, CREATININE, CALCIUM in the last 72 hours.  PT/INR: No results for input(s): LABPROT, INR in the last 72 hours. ABG:  INR: Will add last result for INR, ABG once components are confirmed Will add last 4 CBG results once components are confirmed  Assessment/Plan:  1. CV - SR.  2.  Pulmonary - Chest tube with 195 cc(20 cc last 12 hours) output last 24 hours. Chest tube is to water seal and there is a very small, intermittent air leak with cough. CXR this am appears stable (small right apical pneumothorax,  subcutaneous emphysema right chest wall and neck). Hope to remove pigtail this am. Check CXR. Encourage incentive spirometer. Final  pathology: LUNG, RIGHT LOWER LOBE SUPERIOR SEGMENT, LOBECTOMY:  - Invasive moderately differentiated squamous cell carcinoma, 1.0 cm TNM Code:pT1a, pN0  3. Expected ABL anemia-H and H yesterday 9.8 and 29.9 4. Regarding right chest tube drainage, silk suture is tight and in middle of wound but leaking serous drainage from both sides of wound. Dressings changes ordered  Donielle M ZimmermanPA-C 03/22/2020,7:00 AM [phone]  Patient seen and examined, agree with above I see no leak with repeated cough. Will dc pigtail this AM Possibly home later today Path- T1N0 adenocarcinoma, stage IA  Remo Lipps C. Roxan Hockey, MD [facility] [phone]

## 2020-03-22 NOTE — Discharge Instructions (Signed)
Please apply dry 4x4s, abd pad, and tape to right lateral chest tube wound. Change daily and as needed. If drainage does not stop in next several days or area gets reddened, please call office  Robot-Assisted Thoracic Surgery, Care After This sheet gives you information about how to care for yourself after your procedure. Your health care provider may also give you more specific instructions. If you have problems or questions, contact your health care provider. What can I expect after the procedure? After the procedure, it is common to have:  Some pain and aches in the area of your surgical cuts (incisions).  Pain when breathing in (inhaling) and coughing.  Tiredness (fatigue).  Trouble sleeping.  Constipation. Follow these instructions at home: Medicines  Take over-the-counter and prescription medicines only as told by your health care provider.  If you were prescribed an antibiotic medicine, take it as told by your health care provider. Do not stop taking the antibiotic even if you start to feel better.  Talk with your health care provider about safe and effective ways to manage pain after your procedure. Pain management should fit your specific health needs.  Take prescription pain medicine before pain becomes severe. Relieving and controlling your pain will make breathing easier for you. Activity  Return to your normal activities as told by your health care provider. Ask your health care provider what activities are safe for you.  Do not lift anything that is heavier than 10 lb (4.5 kg), or the limit that you are told, until your health care provider says that it is safe.  Avoid sitting for a long time without moving. Get up and move around one or more times every few hours. Bathing  Do not take baths, swim, or use a hot tub until your health care provider approves. You may take showers. Incision care  Follow instructions from your health care provider about how to take care  of your incision(s). Make sure you: ? Wash your hands with soap and water before you change your bandage (dressing). If soap and water are not available, use hand sanitizer. ? Change your dressing as told by your health care provider. ? Leave stitches (sutures), skin glue, or adhesive strips in place. These skin closures may need to stay in place for 2 weeks or longer. If adhesive strip edges start to loosen and curl up, you may trim the loose edges. Do not remove adhesive strips completely unless your health care provider tells you to do that.  Check your incision area every day for signs of infection. Check for: ? Redness, swelling, or pain. ? Fluid or blood. ? Warmth. ? Pus or a bad smell. Driving  Ask your health care provider when it is safe for you to drive.  Do not drive or use heavy machinery while taking prescription pain medicine. Eating and drinking  Follow instructions from your health care provider about eating or drinking restrictions. These will vary depending on what procedure you had. Your health care provider may recommend: ? A liquid diet or soft diet for the first few days. ? Meals that are smaller and more frequent. ? A diet of fruits, vegetables, whole grains, and low-fat proteins. ? Limiting foods that are high in processed sugar and fat, including fried and sweet foods. Pneumonia prevention   Do not use any products that contain nicotine or tobacco, such as cigarettes and e-cigarettes. If you need help quitting, ask your health care provider.  Avoid secondhand smoke.  Do  deep breathing exercises and cough regularly as directed. This helps to clear mucus and prevent pneumonia. If it hurts to cough, try one of these methods to ease your pain when you cough: ? Hold a pillow against your chest. ? Place the palms of both hands over your incisions (use splinting).  Use an incentive spirometer as directed. This device measures how much air your lungs are getting with  each breath. Using this will improve your breathing.  Do pulmonary rehabilitation as directed. This is a program that includes exercise, education, and support. General instructions  Wear compression stockings as told by your health care provider. These stockings help to prevent blood clots and reduce swelling in your legs.  If you have a drainage tube: ? Follow instructions from your health care provider about how to take care of it. ? Do not travel by airplane after your tube is removed until your health care provider tells you it is safe.  To prevent or treat constipation while you are taking prescription pain medicine, your health care provider may recommend that you: ? Drink enough fluid to keep your urine pale yellow. ? Take over-the-counter or prescription medicines. ? Eat foods that are high in fiber, such as fresh fruits and vegetables, whole grains, and beans. ? Limit foods that are high in fat and processed sugars, such as fried and sweet foods.  Keep all follow-up visits as told by your health care provider. This is important. Contact a health care provider if:  You have redness, swelling, or pain around an incision.  You have fluid or blood coming from an incision.  An incision feels warm to the touch.  You have pus or a bad smell coming from an incision.  You have a fever.  You cannot eat or drink without vomiting.  Your prescription pain medicine is not controlling your pain. Get help right away if:  You have chest pain.  Your heart is beating quickly.  You have trouble breathing.  You have trouble speaking.  You are confused.  You feel weak or dizzy, or you faint. These symptoms may represent a serious problem that is an emergency. Do not wait to see if the symptoms will go away. Get medical help right away. Call your local emergency services (911 in the [place] Do not drive yourself to the hospital. Summary  Talk with your health care provider about  safe and effective ways to manage pain after your procedure. Pain management should fit your specific health needs.  Return to your normal activities as told by your health care provider. Ask your health care provider what activities are safe for you.  Do deep breathing exercises and cough regularly as directed. This helps to clear mucus and prevent pneumonia. If it hurts to cough, ease pain by holding a pillow against your chest or by placing the palms of both hands over your incisions (splinting). This information is not intended to replace advice given to you by your health care provider. Make sure you discuss any questions you have with your health care provider. Document Revised: 08/01/2019 Document Reviewed: 02/05/2017 Elsevier Patient Education  [employer].   Call the office at [phone] if any problems arise.

## 2020-03-22 NOTE — Progress Notes (Signed)
Discharged home accompanied by daughter and spouse. Discharged instructions given. Belongings taken home.

## 2020-03-25 ENCOUNTER — Other Ambulatory Visit: Payer: Self-pay | Admitting: *Deleted

## 2020-03-25 NOTE — Progress Notes (Signed)
The proposed treatment discussed in cancer conference 03/25/20 is for discussion purpose only and is not a binding recommendation.  The patient have not physically examined nor present for their treatment options.  Therefore, final treatment plans cannot be decided.

## 2020-04-02 ENCOUNTER — Ambulatory Visit (INDEPENDENT_AMBULATORY_CARE_PROVIDER_SITE_OTHER): Payer: Self-pay

## 2020-04-02 ENCOUNTER — Other Ambulatory Visit: Payer: Self-pay

## 2020-04-02 DIAGNOSIS — Z4802 Encounter for removal of sutures: Secondary | ICD-10-CM

## 2020-04-02 NOTE — Progress Notes (Signed)
Removed 2 sutures from chest tube incision sites with no signs of infection and patient tolerated well.

## 2020-04-05 ENCOUNTER — Other Ambulatory Visit: Payer: Self-pay | Admitting: Thoracic Surgery (Cardiothoracic Vascular Surgery)

## 2020-04-05 DIAGNOSIS — R911 Solitary pulmonary nodule: Secondary | ICD-10-CM

## 2020-04-06 ENCOUNTER — Ambulatory Visit (INDEPENDENT_AMBULATORY_CARE_PROVIDER_SITE_OTHER): Payer: Self-pay | Admitting: Thoracic Surgery (Cardiothoracic Vascular Surgery)

## 2020-04-06 ENCOUNTER — Other Ambulatory Visit: Payer: Self-pay

## 2020-04-06 ENCOUNTER — Ambulatory Visit
Admission: RE | Admit: 2020-04-06 | Discharge: 2020-04-06 | Disposition: A | Discharge status: Home / Self Care | Payer: Medicare HMO | Source: Ambulatory Visit | Attending: Thoracic Surgery (Cardiothoracic Vascular Surgery) | Admitting: Thoracic Surgery (Cardiothoracic Vascular Surgery)

## 2020-04-06 ENCOUNTER — Telehealth: Payer: Self-pay | Admitting: Physician Assistant

## 2020-04-06 ENCOUNTER — Encounter: Payer: Self-pay | Admitting: Thoracic Surgery (Cardiothoracic Vascular Surgery)

## 2020-04-06 DIAGNOSIS — C3431 Malignant neoplasm of lower lobe, right bronchus or lung: Secondary | ICD-10-CM

## 2020-04-06 DIAGNOSIS — R911 Solitary pulmonary nodule: Secondary | ICD-10-CM

## 2020-04-06 DIAGNOSIS — J90 Pleural effusion, not elsewhere classified: Secondary | ICD-10-CM | POA: Diagnosis not present

## 2020-04-06 DIAGNOSIS — J439 Emphysema, unspecified: Secondary | ICD-10-CM | POA: Diagnosis not present

## 2020-04-06 HISTORY — DX: Malignant neoplasm of lower lobe, right bronchus or lung: C34.31

## 2020-04-06 NOTE — Progress Notes (Signed)
[address]       [place] [address]             [phone]     HPI: [name] returns for a scheduled follow-up visit  Amy [name] is a 74 year old woman with a history of tobacco abuse, hypertension, hyperlipidemia, depression, stage III chronic kidney disease, and coronary atherosclerosis. She presented with a chronic cough. She saw Dr. Ander Slade. A low-dose CT scan was done for lung cancer screening. It showed a spiculated right lower lobe lung nodule. On PET CT the nodule was hypermetabolic with an SUV of 5.1.  I did a robotic right lower lobe superior segmentectomy and node dissection on 03/17/2020. Postoperatively she did well and went home on day five.  She is not taking any narcotics. She has some numbness in her right upper quadrant. She complains that food does not taste right. She is also had some belching. She has difficulty lying in bed and has been sleeping on the couch.  Past Medical History:  Diagnosis Date  . Anxiety   . COPD (chronic obstructive pulmonary disease) ([place]) 2021  . Depression 01/28/2020  . Hyperlipidemia   . Hypertension   . Pneumonia   . 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  Status post right lower lobe superior segmentectomy    Current Outpatient Medications  Medication Sig Dispense Refill  . albuterol (VENTOLIN HFA) 108 (90 Base) MCG/ACT inhaler Inhale 1-2 puffs into the lungs every 6 (six) hours as needed for wheezing or shortness of breath.     . [employer] TURMERIC CURCUMIN PO Take 450 mg by mouth daily.     . magnesium oxide (MAG-OX) 400 MG tablet Take 400 mg by mouth daily.    Amy [name] 3-6-9 CAPS Take 2 capsules by mouth daily.    . raloxifene (EVISTA) 60 MG tablet Take 60 mg by mouth daily.    . sertraline (ZOLOFT) 50 MG tablet Take 50 mg by mouth daily.    . simvastatin (ZOCOR) 40 MG tablet Take 40 mg by mouth every evening.     . traMADol (ULTRAM) 50 MG tablet  Take 1 tablet (50 mg total) by mouth every 6 (six) hours as needed for severe pain (mild pain). 30 tablet 0  . lisinopril (ZESTRIL) 20 MG tablet Take 20 mg by mouth daily. (Patient not taking: Reported on 04/06/2020)     No current facility-administered medications for this visit.    Physical Exam BP 128/78   Pulse 92   Resp 20   Ht 5\' 4"  (1.626 m)   Wt 148 lb (67.1 kg)   SpO2 96%   BMI 25.76 kg/m  74 year old woman in no acute distress Alert and oriented x3 with no focal deficits Lungs diminished at right base but otherwise clear Incisions: some swelling and very mild erythema around chest tube site, otherwise healing well Cardiac regular rate and rhythm No peripheral edema  Diagnostic Tests: Chest x-ray shows some postoperative changes. There is small amount of fluid on the right.  Impression: Amy [name] is a 74 year old woman with a past history of tobacco abuse, hypertension, hyperlipidemia, depression, stage III chronic kidney disease, and coronary atherosclerosis. She presented with a cough. Work-up revealed a right lower lobe lung nodule. It was hypermetabolic on PET/CT. I did a robotic right lower lobe superior segmentectomy and node dissection. It turned out to be a T1, N0, stage Ia squamous  cell carcinoma.  She did well postoperatively and went home on day five.  She has some mild discomfort but is not requiring any narcotics. Her primary complaint is that food does not taste right. That should improve with time. She said some difficulty getting comfortable in bed has been sleeping on the couch, but she is able to sleep in that position.  At this point there are no restrictions on her activities although she was advised to build into new activities slowly and gradually. She may drive on a limited basis. Appropriate precautions were discussed.  She had a stage Ia lesion and should not need any adjuvant therapy. We will refer her to oncology for consultation. We will set  up an appointment with Dr. Julien Nordmann in our multidisciplinary clinic.  Tobacco abuse-she has not smoked since surgery. I congratulated her on that. She is well aware of the importance of tobacco cessation.  Plan: Refer to Dr. Julien Nordmann at multidisciplinary thoracic oncology clinic Return in 3 weeks with PA lateral chest x-ray to check on progress  [name], MD [facility] [phone]

## 2020-04-06 NOTE — Telephone Encounter (Signed)
Received a new pt referral from Dr. Roxan Hockey for luncg cancer. Amy [name] has been cld and scheduled to see [name] on 6/28 at 130pm w/labs at 1pm. Pt aware to arrive 15 minutes early.

## 2020-04-09 ENCOUNTER — Other Ambulatory Visit: Payer: Self-pay | Admitting: Physician Assistant

## 2020-04-09 DIAGNOSIS — C3431 Malignant neoplasm of lower lobe, right bronchus or lung: Secondary | ICD-10-CM

## 2020-04-10 NOTE — Progress Notes (Signed)
[place] Telephone:(336) [phone]   Fax:(336) [phone]  CONSULT NOTE  REFERRING PHYSICIAN: Dr. Roxan Hockey  REASON FOR CONSULTATION:  Stage Ia Squamous Cell Carcinoma  HPI Amy [name] is a 74 y.o. female  with a past medical history significant for tobacco use, depression, hypertension, COPD, chronic kidney disease, and hyperlipidemia is referred to the clinic for stage Ia non-small cell lung cancer, squamous cell carcinoma.   The patient's evaluation started after she presented to her primary care provider for the chief complaint of a cough, which was exacerbated by smoking cigarettes.  The patient's primary care provider referred her to the lung cancer screening program. On 02/03/2020, the patient had a low dose CT lung cancer screening which noted a 10.8 mm right lower nodule. The patient subsequently had a PET scan on 02/11/20 to further evaluate this lesion which showed a hypermetabolic right lower lobe nodule most indicative of a stage Ia primary bronchogenic carcinoma with an SUV max of 5.1.  The patient underwent a right lower lobe superior segmentectomy and lymph node dissection under the care of Dr. Roxan Hockey on 03/17/2020.  The final pathology ([id number]), showed an invasive moderately differentiated squamous cell carcinoma, 1.0 cm.  All 9 lymph nodes were negative for disease.  The patient was diagnosed with a pT1a, pN0) stage Ia.  Overall, the patient is feeling fairly well today.  Her main concern is related to belching which started approximately 2 weeks ago.  She denies any heartburn, abdominal pain, or chest pain.  She tried to use Tums with some relief.  She then used her husband's prescription reflux medication, although they are not sure which one.  This provided some improvement in her symptoms.  She denies any recent fever, chills, night sweats, or unexpected weight loss.  She notes that her cough is better.  She still notes her baseline dyspnea on  exertion and fatigue.  She denies any nausea, vomiting, diarrhea, or constipation.  She denies any headache or visual changes.  The patient is 1 of 16 siblings.  The patient's mother had " hardening of the arteries" requiring bilateral amputations of her lower extremity.  The patient's family history significant for malignancy.  The patient had 3 sisters and 1 brother who all had bladder cancer.  The patient also had another sister with melanoma.  The patient also had another sister with a brain tumor, although she is unsure if this was primary or secondary.  The patient used to work as a Network engineer for 37 years.  She is married and has 2 children.  She smoked for 57 years averaging a half a pack per day.  She quit smoking cigarettes prior to her recent lung surgery. She denies any history of drug use.  The patient drinks wine very sparingly and has 1 drink every couple months.    HPI  Past Medical History:  Diagnosis Date  . Anxiety   . COPD (chronic obstructive pulmonary disease) ([place]) 2021  . Depression 01/28/2020  . Hyperlipidemia   . Hypertension   . Pneumonia   . Squamous cell carcinoma of bronchus in right lower lobe ([place]) 04/06/2020   T1, N0, stage Ia.  Status post right lower lobe superior segmentectomy    Past Surgical History:  Procedure Laterality Date  . APPENDECTOMY    . EYE SURGERY    . INTERCOSTAL NERVE BLOCK Right 03/17/2020   Procedure: Intercostal Nerve Block;  Surgeon: [name], MD;  Location: [place];  Service: Thoracic;  Laterality: Right;  . NODE DISSECTION Right 03/17/2020   Procedure: Node Dissection;  Surgeon: [name], MD;  Location: [facility];  Service: Thoracic;  Laterality: Right;  . TUBAL LIGATION      No family history on file.  Social History Social History   Tobacco Use  . Smoking status: Current Every Day Smoker    Packs/day: 1.00    Years: 55.00    Pack years: 55.00    Types: Cigarettes  . Smokeless tobacco: Never Used    Vaping Use  . Vaping Use: Never assessed  Substance Use Topics  . Alcohol use: Yes    Comment: Social with wine  . Drug use: Never    No Known Allergies  Current Outpatient Medications  Medication Sig Dispense Refill  . [employer] TURMERIC CURCUMIN PO Take 450 mg by mouth daily.     . magnesium oxide (MAG-OX) 400 MG tablet Take 400 mg by mouth daily.    Amy [name] 3-6-9 CAPS Take 2 capsules by mouth daily.    . raloxifene (EVISTA) 60 MG tablet Take 60 mg by mouth daily.    . sertraline (ZOLOFT) 50 MG tablet Take 50 mg by mouth daily.    . simvastatin (ZOCOR) 40 MG tablet Take 40 mg by mouth every evening.     Marland Kitchen albuterol (VENTOLIN HFA) 108 (90 Base) MCG/ACT inhaler Inhale 1-2 puffs into the lungs every 6 (six) hours as needed for wheezing or shortness of breath.  (Patient not taking: Reported on 04/12/2020)    . lisinopril (ZESTRIL) 20 MG tablet Take 20 mg by mouth daily. (Patient not taking: Reported on 04/06/2020)    . omeprazole (PRILOSEC) 20 MG capsule Take 1 capsule (20 mg total) by mouth daily. 30 capsule 0  . traMADol (ULTRAM) 50 MG tablet Take 1 tablet (50 mg total) by mouth every 6 (six) hours as needed for severe pain (mild pain). 30 tablet 0   No current facility-administered medications for this visit.    REVIEW OF SYSTEMS:   Review of Systems  Constitutional: Negative for appetite change, chills, fatigue, fever and unexpected weight change.  HENT: Negative for mouth sores, nosebleeds, sore throat and trouble swallowing.   Eyes: Negative for eye problems and icterus.  Respiratory: Positive for baseline dyspnea on exertion. Negative for cough, hemoptysis, and wheezing.   Cardiovascular: Negative for chest pain and leg swelling.  Gastrointestinal: Positive for belching. Negative for abdominal pain, constipation, diarrhea, nausea and vomiting.  Genitourinary: Negative for bladder incontinence, difficulty urinating, dysuria, frequency and hematuria.   Musculoskeletal: Negative for  back pain, gait problem, neck pain and neck stiffness.  Skin: Negative for itching and rash.  Neurological: Negative for dizziness, extremity weakness, gait problem, headaches, light-headedness and seizures.  Hematological: Negative for adenopathy. Does not bruise/bleed easily.  Psychiatric/Behavioral: Negative for confusion, depression and sleep disturbance. The patient is not nervous/anxious.     PHYSICAL EXAMINATION:  Blood pressure (!) 143/67, pulse (!) 105, temperature 97.9 F (36.6 C), temperature source Temporal, resp. rate 18, height 5\' 4"  (1.626 m), weight 149 lb 11.2 oz (67.9 kg), SpO2 98 %.  ECOG PERFORMANCE STATUS: 0  Physical Exam  Constitutional: Oriented to person, place, and time and well-developed, well-nourished, and in no distress.  HENT:  Head: Normocephalic and atraumatic.  Mouth/Throat: Oropharynx is clear and moist. No oropharyngeal exudate.  Eyes: Conjunctivae are normal. Right eye exhibits no discharge. Left eye exhibits no discharge. No scleral icterus.  Neck: Normal range of motion. Neck supple.  Cardiovascular: Normal rate, regular rhythm, normal heart sounds and intact distal pulses.   Pulmonary/Chest: Effort normal and breath sounds normal. No respiratory distress. No wheezes. No rales.  Abdominal: Soft. Bowel sounds are normal. Exhibits no distension and no mass. There is no tenderness.  Musculoskeletal: Normal range of motion. Exhibits no edema.  Lymphadenopathy:    No cervical adenopathy.  Neurological: Alert and oriented to person, place, and time. Exhibits normal muscle tone. Gait normal. Coordination normal.  Skin: Skin is warm and dry. No rash noted. Not diaphoretic. No erythema. No pallor.  Psychiatric: Mood, memory and judgment normal.  Vitals reviewed.  LABORATORY DATA: Lab Results  Component Value Date   WBC 10.4 04/12/2020   HGB 12.4 04/12/2020   HCT 37.5 04/12/2020   MCV 94.9 04/12/2020   PLT 401 (H) 04/12/2020      Chemistry        Component Value Date/Time   NA 139 04/12/2020 1311   K 4.1 04/12/2020 1311   CL 101 04/12/2020 1311   CO2 29 04/12/2020 1311   BUN 20 04/12/2020 1311   CREATININE 1.17 (H) 04/12/2020 1311      Component Value Date/Time   CALCIUM 9.8 04/12/2020 1311   ALKPHOS 102 04/12/2020 1311   AST 14 (L) 04/12/2020 1311   ALT 12 04/12/2020 1311   BILITOT 0.4 04/12/2020 1311       RADIOGRAPHIC STUDIES: DG Chest 1 View  Result Date: 03/22/2020 CLINICAL DATA:  Pneumothorax, lower right chest pain EXAM: CHEST  1 VIEW COMPARISON:  Radiograph 03/21/2020, CT 02/03/2020 FINDINGS: Interval removal of the apically directed right chest tube. A right pigtail pleural drain remains in place. There is a persistent small right apical pneumothorax. There is stable patchy opacities in the right mid to lower lung with associated postsurgical change. Extensive subcutaneous emphysema again seen extending across the right chest wall and into the base of the right neck. No new osseous or soft tissue abnormality. Stable cardiomediastinal contours. Telemetry leads overlie the chest. IMPRESSION: 1. Stable small right apical pneumothorax status post right apical chest tube removal. Right pigtail pleural drain remains in place. 2. Stable patchy opacities in the right mid to lower lung with associated postsurgical change. 3. Extensive subcutaneous emphysema across the right chest wall and base of the right neck. Electronically Signed   By: Lovena Le M.D.   On: 03/22/2020 06:54   DG Chest 2 View  Result Date: 04/06/2020 CLINICAL DATA:  Follow-up pulmonary nodule EXAM: CHEST - 2 VIEW COMPARISON:  03/22/2020 FINDINGS: Progressive opacity within the right lower lobe. Small right pleural effusion with loculated right apical component. Left lung is clear. No pneumothorax. Normal heart size. Atherosclerotic calcification of the aortic knob. Resolving chest wall subcutaneous emphysema. IMPRESSION: Progressive right lower lobe opacity  with small right pleural effusion. Electronically Signed   By: Davina Poke D.O.   On: 04/06/2020 15:56   DG Chest 2 View  Result Date: 03/17/2020 CLINICAL DATA:  Preoperative exam today for lobectomy. Right lower lobe nodule. EXAM: CHEST - 2 VIEW COMPARISON:  Chest radiography 01/28/2020. Recent chest CT and PET scan. FINDINGS: Heart and mediastinal shadows are normal except for aortic atherosclerosis. Slightly prominent interstitial lung markings with pleural and parenchymal scarring at the lung apices and mild emphysematous change. 1 cm nodule on the posterolateral right lower lobe has not changed by radiography. No infiltrate, collapse or effusion. No significant bone finding. IMPRESSION: No change in a posterolateral right lower lobe 1 cm pulmonary nodule. No  active process otherwise. Aortic atherosclerosis. Electronically Signed   By: Nelson Chimes M.D.   On: 03/17/2020 09:53   DG Chest 1V REPEAT Same Day  Result Date: 03/22/2020 CLINICAL DATA:  Chest tube removal, right lung surgery. EXAM: CHEST - 1 VIEW SAME DAY COMPARISON:  03/22/2020 at 0642 hours and CT chest 02/03/2020. FINDINGS: Patient is rotated. Trachea is midline. Heart size stable. Right chest tubes have been removed with a stable small right apical pneumothorax. Mild right basilar airspace opacification, unchanged. Left apical scarring. Left lung is otherwise clear. Extensive subcutaneous emphysema along the right neck and right chest wall. IMPRESSION: 1. Stable small right apical pneumothorax after removal of remaining right chest tubes. 2. Stable right basilar airspace opacification which may be postoperative in etiology. 3. Extensive subcutaneous emphysema in the right neck and right chest wall. Electronically Signed   By: Lorin Picket M.D.   On: 03/22/2020 09:49   DG CHEST PORT 1 VIEW  Result Date: 03/21/2020 CLINICAL DATA:  Follow-up pneumothorax and right-sided chest tubes. EXAM: PORTABLE CHEST 1 VIEW COMPARISON:  03/20/2020  and earlier exams. FINDINGS: Two right-sided chest tubes are stable. Subtle small pneumothorax noted previously at the right apex is without change. Postsurgical changes in the right lower lung with hazy right lower lung airspace opacity is stable. No new lung abnormalities. Right-sided subcutaneous emphysema is also unchanged. IMPRESSION: 1. No change from the previous day's study. 2. Subtle small right apical pneumothorax is suggested. Hazy right lower lung airspace opacity is unchanged. 3. Stable right-sided chest tubes and stable right-sided subcutaneous emphysema. Electronically Signed   By: Lajean Manes M.D.   On: 03/21/2020 08:15   DG Chest Port 1 View  Result Date: 03/20/2020 CLINICAL DATA:  Status post right lobectomy.  Follow-up exam. EXAM: PORTABLE CHEST 1 VIEW COMPARISON:  03/19/2020 and earlier studies. FINDINGS: Subtle pleural line noted at the right apex on the prior study is unchanged, consistent with a minimal pneumothorax. Hazy opacity is noted in the right lower lung consistent with atelectasis or infection. There are right lower lung pulmonary anastomosis staples which are stable. Right-sided pigtail and larger caliber chest tubes are stable, the latter with its tip at the apex. Left lung is clear. Extensive right-sided subcutaneous emphysema is unchanged. IMPRESSION: 1. No significant change from the most recent prior study. 2. Minimal right apical pneumothorax is again noted. 3. Hazy opacity noted in the right lung base consistent with atelectasis or infection. 4. Stable right-sided chest tubes and stable and fairly extensive right-sided subcutaneous emphysema. Electronically Signed   By: Lajean Manes M.D.   On: 03/20/2020 09:42   DG CHEST PORT 1 VIEW  Result Date: 03/19/2020 CLINICAL DATA:  Chest tube EXAM: PORTABLE CHEST 1 VIEW COMPARISON:  03/18/2020 FINDINGS: 2 right chest tubes again noted. Right pneumothorax seen on yesterday's study has decreased in the interval although a  somewhat more difficult to visualize due to the increased subcutaneous emphysema over the right hemithorax. There is some slight progression of right base collapse/consolidation. Interstitial markings are diffusely coarsened with chronic features. Cardiopericardial silhouette is at upper limits of normal for size. Bones are diffusely demineralized. Telemetry leads overlie the chest. IMPRESSION: Interval decrease in right pneumothorax with progressive subcutaneous emphysema and worsening right base collapse/consolidation. Electronically Signed   By: Misty Stanley M.D.   On: 03/19/2020 09:30   DG CHEST PORT 1 VIEW  Result Date: 03/18/2020 CLINICAL DATA:  Soreness of the chest.  Chest tube in place. EXAM: PORTABLE CHEST 1 VIEW  COMPARISON:  03/17/2020 FINDINGS: Right pleural drain and larger bore right chest tube noted. Approximately 30% right apical pneumothorax persists. Wedge resection clips at the right lung base with hazy surrounding density slightly increased from prior along with some mild localized interstitial accentuation. Left apical pleuroparenchymal scarring. Atherosclerotic aortic arch and descending thoracic aorta. Mild increase in subcutaneous emphysema along the right lateral chest wall. Mild volume loss on the right. IMPRESSION: 1. Essentially stable right apical pneumothorax, estimated 30% of right hemithoracic volume. Right pleural drain and separate larger bore right chest tube remain in place. 2. Slight increase in subcutaneous emphysema along the right lateral chest wall. 3. Mildly increased hazy opacity at the right lung base adjacent to the wedge resection clips. This may simply be postoperative but surveillance may be warranted to exclude developing pneumonia. Electronically Signed   By: Van Clines M.D.   On: 03/18/2020 10:03   DG Chest Port 1 View  Result Date: 03/17/2020 CLINICAL DATA:  Pneumothorax EXAM: PORTABLE CHEST 1 VIEW COMPARISON:  Earlier same day FINDINGS: New right  apical pneumothorax. There are 2 new chest tubes present on the right. Postoperative changes at the right lung base with suture material. Stable chronic mild interstitial prominence. No pleural effusion. Stable cardiomediastinal contours with normal heart size. Mild subcutaneous emphysema in the right chest. IMPRESSION: Postoperative changes at the right lung base. New right apical pneumothorax with two chest tubes present. Electronically Signed   By: Macy Mis M.D.   On: 03/17/2020 16:06    ASSESSMENT: This is a very pleasant 74 year old Caucasian female diagnosed with stage Ia (T1, N0, M0) non-small cell lung cancer, squamous cell carcinoma.  She presented with a right lower lobe lung nodule.  She was diagnosed in June 2021.  She is status post a right lower lobe superior segmentectomy and lymph node dissection under the care of Dr. Roxan Hockey on 03/17/2020.  PLAN: The patient was seen with Dr. Julien Nordmann today.  Dr. Julien Nordmann had a discussion with the patient and her husband about her current condition and recommendations.  Dr. Julien Nordmann recommends that the patient continue on observation with a restaging CT scan of the chest in 6 months.  I will arrange for the patient to have a restaging CT scan of the chest in 6 months.  We will see her back a few days after her scan for evaluation and to discuss the results.  For the patient's belching, I will send a 1 month supply of Prilosec 20 mg p.o. daily to her pharmacy.  She will continue to follow with Dr. Roxan Hockey on July 13th, 2021 for follow-up from her recent surgery.  The patient voices understanding of current disease status and treatment options and is in agreement with the current care plan.  All questions were answered. The patient knows to call the clinic with any problems, questions or concerns. We can certainly see the patient much sooner if necessary.  Thank you so much for allowing me to participate in the care of Amy [name]. I  will continue to follow up the patient with you and assist in her care. The total time spent in the appointment was 60 minutes.  Disclaimer: This note was dictated with voice recognition software. Similar sounding words can inadvertently be transcribed and may not be corrected upon review.   Amy [name] April 12, 2020, 2:55 PM  ADDENDUM: Hematology/Oncology Attending: I had a face-to-face encounter with the patient today.  I recommended her care plan.  This is a very  pleasant 74 years old white female with long history of smoking who had screening CT scan of the chest after she presented with persistent cough.  The scan showed 1.1 regular nodule in the posterior right lower lobe.  This was followed by a PET scan on 02/11/2020 and showed hypermetabolic right lower lobe nodule indicative of a stage Ia primary bronchogenic carcinoma.  There was additional small bilateral pulmonary nodules with no hypermetabolic activity. The patient was referred to Dr. Roxan Hockey and on March 17, 2020, she underwent right lower lobe superior segmentectomy with lymph node dissection. The final pathology ([id number]) was consistent with invasive moderately differentiated squamous cell carcinoma measuring 1.0 cm with negative resection margin and no evidence of visceral pleural or lymphovascular invasion. Dr. Roxan Hockey referred the patient to [place] today for evaluation and recommendation regarding her condition. I had a lengthy discussion with the patient and her husband about her condition.  I explained to the patient that the 5 years survival for patient with stage Ia non-small cell lung cancer is around 80%.  I also explained to the patient that there is no survival benefit for adjuvant systemic chemotherapy or radiation for stage IA non-small cell lung cancer and the current standard of care is observation. We will arrange for the patient to come back for follow-up visit in 6 months with repeat CT scan of  the chest. She was advised to call immediately if she has any concerning symptoms in the interval.  Disclaimer: This note was dictated with voice recognition software. Similar sounding words can inadvertently be transcribed and may be missed upon review. Eilleen Kempf, MD 04/12/20

## 2020-04-12 ENCOUNTER — Inpatient Hospital Stay: Payer: Medicare HMO | Attending: Physician Assistant | Admitting: Physician Assistant

## 2020-04-12 ENCOUNTER — Encounter: Payer: Self-pay | Admitting: Physician Assistant

## 2020-04-12 ENCOUNTER — Other Ambulatory Visit: Payer: Self-pay

## 2020-04-12 ENCOUNTER — Inpatient Hospital Stay: Payer: Medicare HMO

## 2020-04-12 VITALS — BP 143/67 | HR 105 | Temp 97.9°F | Resp 18 | Ht 64.0 in | Wt 149.7 lb

## 2020-04-12 DIAGNOSIS — N189 Chronic kidney disease, unspecified: Secondary | ICD-10-CM | POA: Insufficient documentation

## 2020-04-12 DIAGNOSIS — C3431 Malignant neoplasm of lower lobe, right bronchus or lung: Secondary | ICD-10-CM | POA: Insufficient documentation

## 2020-04-12 DIAGNOSIS — E785 Hyperlipidemia, unspecified: Secondary | ICD-10-CM | POA: Diagnosis not present

## 2020-04-12 DIAGNOSIS — F329 Major depressive disorder, single episode, unspecified: Secondary | ICD-10-CM | POA: Diagnosis not present

## 2020-04-12 DIAGNOSIS — Z79899 Other long term (current) drug therapy: Secondary | ICD-10-CM | POA: Insufficient documentation

## 2020-04-12 DIAGNOSIS — Z87891 Personal history of nicotine dependence: Secondary | ICD-10-CM | POA: Diagnosis present

## 2020-04-12 DIAGNOSIS — I129 Hypertensive chronic kidney disease with stage 1 through stage 4 chronic kidney disease, or unspecified chronic kidney disease: Secondary | ICD-10-CM | POA: Insufficient documentation

## 2020-04-12 DIAGNOSIS — J449 Chronic obstructive pulmonary disease, unspecified: Secondary | ICD-10-CM | POA: Insufficient documentation

## 2020-04-12 DIAGNOSIS — R69 Illness, unspecified: Secondary | ICD-10-CM | POA: Diagnosis not present

## 2020-04-12 LAB — CMP (CANCER CENTER ONLY)
ALT: 12 U/L (ref 0–44)
AST: 14 U/L — ABNORMAL LOW (ref 15–41)
Albumin: 3.3 g/dL — ABNORMAL LOW (ref 3.5–5.0)
Alkaline Phosphatase: 102 U/L (ref 38–126)
Anion gap: 9 (ref 5–15)
BUN: 20 mg/dL (ref 8–23)
CO2: 29 mmol/L (ref 22–32)
Calcium: 9.8 mg/dL (ref 8.9–10.3)
Chloride: 101 mmol/L (ref 98–111)
Creatinine: 1.17 mg/dL — ABNORMAL HIGH (ref 0.44–1.00)
GFR, Est AFR Am: 53 mL/min — ABNORMAL LOW (ref >60)
GFR, Estimated: 46 mL/min — ABNORMAL LOW (ref >60)
Glucose, Bld: 127 mg/dL — ABNORMAL HIGH (ref 70–99)
Potassium: 4.1 mmol/L (ref 3.5–5.1)
Sodium: 139 mmol/L (ref 135–145)
Total Bilirubin: 0.4 mg/dL (ref 0.3–1.2)
Total Protein: 6.7 g/dL (ref 6.5–8.1)

## 2020-04-12 LAB — CBC WITH DIFFERENTIAL (CANCER CENTER ONLY)
Abs Immature Granulocytes: 0.03 10*3/uL (ref 0.00–0.07)
Basophils Absolute: 0.1 10*3/uL (ref 0.0–0.1)
Basophils Relative: 1 %
Eosinophils Absolute: 0.6 10*3/uL — ABNORMAL HIGH (ref 0.0–0.5)
Eosinophils Relative: 5 %
HCT: 37.5 % (ref 36.0–46.0)
Hemoglobin: 12.4 g/dL (ref 12.0–15.0)
Immature Granulocytes: 0 %
Lymphocytes Relative: 15 %
Lymphs Abs: 1.6 10*3/uL (ref 0.7–4.0)
MCH: 31.4 pg (ref 26.0–34.0)
MCHC: 33.1 g/dL (ref 30.0–36.0)
MCV: 94.9 fL (ref 80.0–100.0)
Monocytes Absolute: 0.9 10*3/uL (ref 0.1–1.0)
Monocytes Relative: 9 %
Neutro Abs: 7.3 10*3/uL (ref 1.7–7.7)
Neutrophils Relative %: 70 %
Platelet Count: 401 10*3/uL — ABNORMAL HIGH (ref 150–400)
RBC: 3.95 MIL/uL (ref 3.87–5.11)
RDW: 12.1 % (ref 11.5–15.5)
WBC Count: 10.4 10*3/uL (ref 4.0–10.5)
nRBC: 0 % (ref 0.0–0.2)

## 2020-04-12 MED ORDER — OMEPRAZOLE 20 MG PO CPDR: 20.0000 mg | DELAYED_RELEASE_CAPSULE | Freq: Every day | ORAL | 0 refills | Status: DC

## 2020-04-14 ENCOUNTER — Telehealth: Payer: Self-pay | Admitting: Internal Medicine

## 2020-04-14 NOTE — Telephone Encounter (Signed)
Scheduled per los. Called and left msg. Mailed printout  °

## 2020-04-26 ENCOUNTER — Other Ambulatory Visit: Payer: Self-pay | Admitting: *Deleted

## 2020-04-26 DIAGNOSIS — Z9889 Other specified postprocedural states: Secondary | ICD-10-CM

## 2020-04-27 ENCOUNTER — Ambulatory Visit
Admission: RE | Admit: 2020-04-27 | Discharge: 2020-04-27 | Disposition: A | Discharge status: Home / Self Care | Payer: Medicare HMO | Source: Ambulatory Visit | Attending: Thoracic Surgery (Cardiothoracic Vascular Surgery) | Admitting: Thoracic Surgery (Cardiothoracic Vascular Surgery)

## 2020-04-27 ENCOUNTER — Encounter: Payer: Self-pay | Admitting: Thoracic Surgery (Cardiothoracic Vascular Surgery)

## 2020-04-27 ENCOUNTER — Other Ambulatory Visit: Payer: Self-pay

## 2020-04-27 ENCOUNTER — Ambulatory Visit (INDEPENDENT_AMBULATORY_CARE_PROVIDER_SITE_OTHER): Payer: Self-pay | Admitting: Thoracic Surgery (Cardiothoracic Vascular Surgery)

## 2020-04-27 VITALS — BP 121/74 | HR 78 | Temp 97.5°F | Resp 20 | Ht 64.0 in | Wt 148.0 lb

## 2020-04-27 DIAGNOSIS — Z9889 Other specified postprocedural states: Secondary | ICD-10-CM

## 2020-04-27 DIAGNOSIS — J984 Other disorders of lung: Secondary | ICD-10-CM | POA: Diagnosis not present

## 2020-04-27 DIAGNOSIS — I700 Atherosclerosis of aorta: Secondary | ICD-10-CM | POA: Diagnosis not present

## 2020-04-27 DIAGNOSIS — R918 Other nonspecific abnormal finding of lung field: Secondary | ICD-10-CM | POA: Diagnosis not present

## 2020-04-27 DIAGNOSIS — J90 Pleural effusion, not elsewhere classified: Secondary | ICD-10-CM | POA: Diagnosis not present

## 2020-04-27 DIAGNOSIS — C3431 Malignant neoplasm of lower lobe, right bronchus or lung: Secondary | ICD-10-CM

## 2020-04-27 NOTE — Progress Notes (Signed)
[address]       [place] [address]             [phone]     HPI: Amy [name] returns for a scheduled follow-up visit  Amy [name] is a 74 year old woman with history of tobacco abuse, hypertension, hyperlipidemia, depression, stage III chronic kidney disease, and coronary atherosclerosis.  She presented with a chronic cough.  She was found to have a right lower lobe lung nodule.  That was hypermetabolic on PET.  I did a robotic right lower lobe superior segmentectomy on 03/17/2020.  She had a stage Ia squamous cell carcinoma.  I last saw her in the office on 04/06/2020.  She complained of some shortness of breath and also belching.  She was not taking any narcotics at that time.  Currently she is not having any pain.  She continues to have some belching.  She was started on Prilosec but that did not seem to have any effect.  She says this has gotten better over the last several days.  She says that overall she started to feel more like herself over the past 3 to 4 days.  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  Status post right lower lobe superior segmentectomy    Current Outpatient Medications  Medication Sig Dispense Refill   albuterol (VENTOLIN HFA) 108 (90 Base) MCG/ACT inhaler Inhale 1-2 puffs into the lungs every 6 (six) hours as needed for wheezing or shortness of breath.      [employer] TURMERIC CURCUMIN PO Take 450 mg by mouth daily.      magnesium oxide (MAG-OX) 400 MG tablet Take 400 mg by mouth daily.     Omega 3-6-9 CAPS Take 2 capsules by mouth daily.     omeprazole (PRILOSEC) 20 MG capsule Take 1 capsule (20 mg total) by mouth daily. 30 capsule 0   raloxifene (EVISTA) 60 MG tablet Take 60 mg by  mouth daily.     sertraline (ZOLOFT) 50 MG tablet Take 50 mg by mouth daily.     simvastatin (ZOCOR) 40 MG tablet Take 40 mg by mouth every evening.      lisinopril (ZESTRIL) 20 MG tablet Take 20 mg by mouth daily. (Patient not taking: Reported on 04/06/2020)     traMADol (ULTRAM) 50 MG tablet Take 1 tablet (50 mg total) by mouth every 6 (six) hours as needed for severe pain (mild pain). 30 tablet 0   No current facility-administered medications for this visit.    Physical Exam BP 121/74    Pulse 78    Temp (!) 97.5 F (36.4 C) (Temporal)    Resp 20    Ht 5\' 4"  (1.626 m)    Wt 148 lb (67.1 kg)    SpO2 95% Comment: RA   BMI 25.41 kg/m  74 year old woman in no acute distress Alert and oriented x3 with no focal deficits Lungs slightly diminished at right base but otherwise clear Incisions well-healed Cardiac regular rate and rhythm  Diagnostic Tests: CHEST - 2 VIEW  COMPARISON:  04/06/2020  FINDINGS: Postoperative changes on the right. Stable small right effusion and right lower lobe airspace opacity. Left lung clear. Heart is normal size.  Aortic atherosclerosis. No acute bony abnormality. No pneumothorax.  IMPRESSION: Stable small right effusion and right lower lobe airspace disease.   Electronically Signed   By: Rolm Baptise M.D.   On: 04/27/2020 09:59 I personally reviewed the chest x-ray images and concur with the findings noted above  Impression: Amy [name] is a 74 year old woman with a history of tobacco abuse, hypertension, hyperlipidemia, CAD, stage III chronic kidney disease, and depression.  She had a robotic right lower lobe superior segmentectomy and node dissection for a stage Ia squamous cell carcinoma on 03/17/2020.  She is now about 6 weeks out from surgery.  She is not having any pain.  She does still feel some shortness of breath with exertion.  She is not yet returned to her preoperative baseline.  I suspect she will get back to her baseline or at  least very close before all of a sudden done.  Belching continues to be her primary complaint.  Prilosec did not help.  She sometimes will take Tums and that does seem to help a little bit.  She been trying to breathe more through her nose rather than her mouth.  That seems to have improved significantly over the past week.  Hopefully will continue to do so  He saw Dr. Julien Nordmann.  No adjuvant therapy is recommended.  He will do a CT scan in 6 months.  Plan: Return in 6 months after CT scan. I will be happy to see her back anytime for that if needed  [name], MD [facility] [phone]

## 2020-05-09 ENCOUNTER — Other Ambulatory Visit: Payer: Self-pay | Admitting: Physician Assistant

## 2020-05-09 DIAGNOSIS — C3431 Malignant neoplasm of lower lobe, right bronchus or lung: Secondary | ICD-10-CM

## 2020-06-14 DIAGNOSIS — G4700 Insomnia, unspecified: Secondary | ICD-10-CM | POA: Diagnosis not present

## 2020-06-14 DIAGNOSIS — M8580 Other specified disorders of bone density and structure, unspecified site: Secondary | ICD-10-CM | POA: Diagnosis not present

## 2020-06-14 DIAGNOSIS — I10 Essential (primary) hypertension: Secondary | ICD-10-CM | POA: Diagnosis not present

## 2020-06-14 DIAGNOSIS — E7800 Pure hypercholesterolemia, unspecified: Secondary | ICD-10-CM | POA: Diagnosis not present

## 2020-06-14 DIAGNOSIS — N183 Chronic kidney disease, stage 3 unspecified: Secondary | ICD-10-CM | POA: Diagnosis not present

## 2020-06-14 DIAGNOSIS — R69 Illness, unspecified: Secondary | ICD-10-CM | POA: Diagnosis not present

## 2020-06-14 DIAGNOSIS — M1990 Unspecified osteoarthritis, unspecified site: Secondary | ICD-10-CM | POA: Diagnosis not present

## 2020-06-14 DIAGNOSIS — M810 Age-related osteoporosis without current pathological fracture: Secondary | ICD-10-CM | POA: Diagnosis not present

## 2020-06-16 ENCOUNTER — Other Ambulatory Visit: Payer: Self-pay | Admitting: Family Medicine

## 2020-06-16 DIAGNOSIS — Z1231 Encounter for screening mammogram for malignant neoplasm of breast: Secondary | ICD-10-CM

## 2020-06-30 DIAGNOSIS — Z23 Encounter for immunization: Secondary | ICD-10-CM | POA: Diagnosis not present

## 2020-06-30 DIAGNOSIS — R142 Eructation: Secondary | ICD-10-CM | POA: Diagnosis not present

## 2020-06-30 DIAGNOSIS — R69 Illness, unspecified: Secondary | ICD-10-CM | POA: Diagnosis not present

## 2020-06-30 DIAGNOSIS — I10 Essential (primary) hypertension: Secondary | ICD-10-CM | POA: Diagnosis not present

## 2020-07-12 ENCOUNTER — Ambulatory Visit
Admission: RE | Admit: 2020-07-12 | Discharge: 2020-07-12 | Disposition: A | Discharge status: Home / Self Care | Payer: Medicare HMO | Source: Ambulatory Visit | Attending: Family Medicine | Admitting: Family Medicine

## 2020-07-12 ENCOUNTER — Other Ambulatory Visit: Payer: Self-pay

## 2020-07-12 DIAGNOSIS — Z1231 Encounter for screening mammogram for malignant neoplasm of breast: Secondary | ICD-10-CM | POA: Diagnosis not present

## 2020-08-13 DIAGNOSIS — M1990 Unspecified osteoarthritis, unspecified site: Secondary | ICD-10-CM | POA: Diagnosis not present

## 2020-08-13 DIAGNOSIS — M810 Age-related osteoporosis without current pathological fracture: Secondary | ICD-10-CM | POA: Diagnosis not present

## 2020-08-13 DIAGNOSIS — I10 Essential (primary) hypertension: Secondary | ICD-10-CM | POA: Diagnosis not present

## 2020-08-13 DIAGNOSIS — N183 Chronic kidney disease, stage 3 unspecified: Secondary | ICD-10-CM | POA: Diagnosis not present

## 2020-08-13 DIAGNOSIS — G4700 Insomnia, unspecified: Secondary | ICD-10-CM | POA: Diagnosis not present

## 2020-08-13 DIAGNOSIS — M8580 Other specified disorders of bone density and structure, unspecified site: Secondary | ICD-10-CM | POA: Diagnosis not present

## 2020-08-13 DIAGNOSIS — R69 Illness, unspecified: Secondary | ICD-10-CM | POA: Diagnosis not present

## 2020-08-13 DIAGNOSIS — E7800 Pure hypercholesterolemia, unspecified: Secondary | ICD-10-CM | POA: Diagnosis not present

## 2020-09-06 DIAGNOSIS — M8580 Other specified disorders of bone density and structure, unspecified site: Secondary | ICD-10-CM | POA: Diagnosis not present

## 2020-09-06 DIAGNOSIS — G4700 Insomnia, unspecified: Secondary | ICD-10-CM | POA: Diagnosis not present

## 2020-09-06 DIAGNOSIS — M1990 Unspecified osteoarthritis, unspecified site: Secondary | ICD-10-CM | POA: Diagnosis not present

## 2020-09-06 DIAGNOSIS — R69 Illness, unspecified: Secondary | ICD-10-CM | POA: Diagnosis not present

## 2020-09-06 DIAGNOSIS — N183 Chronic kidney disease, stage 3 unspecified: Secondary | ICD-10-CM | POA: Diagnosis not present

## 2020-09-06 DIAGNOSIS — M810 Age-related osteoporosis without current pathological fracture: Secondary | ICD-10-CM | POA: Diagnosis not present

## 2020-09-06 DIAGNOSIS — E7800 Pure hypercholesterolemia, unspecified: Secondary | ICD-10-CM | POA: Diagnosis not present

## 2020-09-06 DIAGNOSIS — I10 Essential (primary) hypertension: Secondary | ICD-10-CM | POA: Diagnosis not present

## 2020-10-12 ENCOUNTER — Inpatient Hospital Stay: Payer: Medicare HMO | Attending: Physician Assistant

## 2020-10-12 ENCOUNTER — Encounter (HOSPITAL_COMMUNITY): Payer: Self-pay

## 2020-10-12 ENCOUNTER — Ambulatory Visit (HOSPITAL_COMMUNITY)
Admission: RE | Admit: 2020-10-12 | Discharge: 2020-10-12 | Disposition: A | Discharge status: Home / Self Care | Payer: Medicare HMO | Source: Ambulatory Visit | Attending: Physician Assistant | Admitting: Physician Assistant

## 2020-10-12 ENCOUNTER — Other Ambulatory Visit: Payer: Self-pay

## 2020-10-12 DIAGNOSIS — E785 Hyperlipidemia, unspecified: Secondary | ICD-10-CM | POA: Insufficient documentation

## 2020-10-12 DIAGNOSIS — I10 Essential (primary) hypertension: Secondary | ICD-10-CM | POA: Insufficient documentation

## 2020-10-12 DIAGNOSIS — S2241XA Multiple fractures of ribs, right side, initial encounter for closed fracture: Secondary | ICD-10-CM | POA: Diagnosis not present

## 2020-10-12 DIAGNOSIS — C3490 Malignant neoplasm of unspecified part of unspecified bronchus or lung: Secondary | ICD-10-CM | POA: Diagnosis not present

## 2020-10-12 DIAGNOSIS — C3431 Malignant neoplasm of lower lobe, right bronchus or lung: Secondary | ICD-10-CM | POA: Insufficient documentation

## 2020-10-12 DIAGNOSIS — J449 Chronic obstructive pulmonary disease, unspecified: Secondary | ICD-10-CM | POA: Insufficient documentation

## 2020-10-12 DIAGNOSIS — R69 Illness, unspecified: Secondary | ICD-10-CM | POA: Diagnosis not present

## 2020-10-12 DIAGNOSIS — F32A Depression, unspecified: Secondary | ICD-10-CM | POA: Insufficient documentation

## 2020-10-12 DIAGNOSIS — J432 Centrilobular emphysema: Secondary | ICD-10-CM | POA: Diagnosis not present

## 2020-10-12 DIAGNOSIS — I2510 Atherosclerotic heart disease of native coronary artery without angina pectoris: Secondary | ICD-10-CM | POA: Insufficient documentation

## 2020-10-12 DIAGNOSIS — F419 Anxiety disorder, unspecified: Secondary | ICD-10-CM | POA: Diagnosis not present

## 2020-10-12 DIAGNOSIS — I700 Atherosclerosis of aorta: Secondary | ICD-10-CM | POA: Diagnosis not present

## 2020-10-12 DIAGNOSIS — Z79899 Other long term (current) drug therapy: Secondary | ICD-10-CM | POA: Insufficient documentation

## 2020-10-12 LAB — CMP (CANCER CENTER ONLY)
ALT: 16 U/L (ref 0–44)
AST: 20 U/L (ref 15–41)
Albumin: 3.5 g/dL (ref 3.5–5.0)
Alkaline Phosphatase: 80 U/L (ref 38–126)
Anion gap: 4 — ABNORMAL LOW (ref 5–15)
BUN: 16 mg/dL (ref 8–23)
CO2: 30 mmol/L (ref 22–32)
Calcium: 9.3 mg/dL (ref 8.9–10.3)
Chloride: 105 mmol/L (ref 98–111)
Creatinine: 1.1 mg/dL — ABNORMAL HIGH (ref 0.44–1.00)
GFR, Estimated: 52 mL/min — ABNORMAL LOW (ref >60)
Glucose, Bld: 94 mg/dL (ref 70–99)
Potassium: 4.7 mmol/L (ref 3.5–5.1)
Sodium: 139 mmol/L (ref 135–145)
Total Bilirubin: 0.6 mg/dL (ref 0.3–1.2)
Total Protein: 6.7 g/dL (ref 6.5–8.1)

## 2020-10-12 LAB — CBC WITH DIFFERENTIAL (CANCER CENTER ONLY)
Abs Immature Granulocytes: 0.04 10*3/uL (ref 0.00–0.07)
Basophils Absolute: 0 10*3/uL (ref 0.0–0.1)
Basophils Relative: 1 %
Eosinophils Absolute: 0.3 10*3/uL (ref 0.0–0.5)
Eosinophils Relative: 3 %
HCT: 36.9 % (ref 36.0–46.0)
Hemoglobin: 11.9 g/dL — ABNORMAL LOW (ref 12.0–15.0)
Immature Granulocytes: 1 %
Lymphocytes Relative: 20 %
Lymphs Abs: 1.6 10*3/uL (ref 0.7–4.0)
MCH: 28.9 pg (ref 26.0–34.0)
MCHC: 32.2 g/dL (ref 30.0–36.0)
MCV: 89.6 fL (ref 80.0–100.0)
Monocytes Absolute: 0.7 10*3/uL (ref 0.1–1.0)
Monocytes Relative: 9 %
Neutro Abs: 5.4 10*3/uL (ref 1.7–7.7)
Neutrophils Relative %: 66 %
Platelet Count: 242 10*3/uL (ref 150–400)
RBC: 4.12 MIL/uL (ref 3.87–5.11)
RDW: 13.6 % (ref 11.5–15.5)
WBC Count: 8.1 10*3/uL (ref 4.0–10.5)
nRBC: 0 % (ref 0.0–0.2)

## 2020-10-12 MED ORDER — IOHEXOL 300 MG/ML  SOLN
75.0000 mL | Freq: Once | INTRAMUSCULAR | Status: AC | PRN
  Administered 2020-10-12: 75 mL via INTRAVENOUS

## 2020-10-12 NOTE — Progress Notes (Signed)
[place] OFFICE PROGRESS NOTE  Amy Frees, MD [place] [address]  DIAGNOSIS: Stage Ia (T1, N0, M0) non-small cell lung cancer, squamous cell carcinoma.  She presented with a right lower lobe lung nodule.  She was diagnosed in June 2021.   PRIOR THERAPY: Right lower lobe superior segmentectomy and lymph node dissection under the care of Dr. Roxan Hockey on 03/17/2020.  CURRENT THERAPY: Observation  INTERVAL HISTORY: Amy [name] 74 y.o. female returns to the clinic today for a follow up visit. The patient is feeling well today without any concerning complaints except for reflux. She tried prilosec in the past which did not help. The patient is status post a right lower lobe superior segmentectomy and lymph node dissection under the care of Dr. Roxan Hockey. This was performed on 03/17/20. She is currently on observation and feeling well. Denies any fever, chills, night sweats, or weight loss. Denies any chest pain, cough, or hemoptysis. She reports her baseline dyspnea on exertion and with talking. Denies any nausea, vomiting, diarrhea, or constipation. Denies any headache or visual changes. Denies any rashes or skin changes. The patient recently had a restaging CT scan performed. The patient is here today for evaluation and to review her scan results.   MEDICAL HISTORY: Past Medical History:  Diagnosis Date  . Anxiety   . COPD (chronic obstructive pulmonary disease) ([place]) 2021  . Depression 01/28/2020  . Hyperlipidemia   . Hypertension   . Pneumonia   . 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  Status post right lower lobe superior segmentectomy    ALLERGIES:  has No Known Allergies.  MEDICATIONS:  Current Outpatient Medications  Medication Sig Dispense Refill  . albuterol (VENTOLIN HFA) 108 (90 Base) MCG/ACT inhaler Inhale 1-2 puffs into the lungs every 6 (six) hours as needed for  wheezing or shortness of breath.     . CVS TURMERIC CURCUMIN PO Take 450 mg by mouth daily.     Marland Kitchen lisinopril (ZESTRIL) 20 MG tablet Take 20 mg by mouth daily. (Patient not taking: Reported on 04/06/2020)    . magnesium oxide (MAG-OX) 400 MG tablet Take 400 mg by mouth daily.    Ernestine Conrad 3-6-9 CAPS Take 2 capsules by mouth daily.    Marland Kitchen omeprazole (PRILOSEC) 20 MG capsule TAKE ONE CAPSULE BY MOUTH DAILY 30 capsule 0  . raloxifene (EVISTA) 60 MG tablet Take 60 mg by mouth daily.    . sertraline (ZOLOFT) 50 MG tablet Take 50 mg by mouth daily.    . simvastatin (ZOCOR) 40 MG tablet Take 40 mg by mouth every evening.     . traMADol (ULTRAM) 50 MG tablet Take 1 tablet (50 mg total) by mouth every 6 (six) hours as needed for severe pain (mild pain). 30 tablet 0   No current facility-administered medications for this visit.    SURGICAL HISTORY:  Past Surgical History:  Procedure Laterality Date  . APPENDECTOMY    . EYE SURGERY    . INTERCOSTAL NERVE BLOCK Right 03/17/2020   Procedure: Intercostal Nerve Block;  Surgeon: [name], MD;  Location: [facility];  Service: Thoracic;  Laterality: Right;  . NODE DISSECTION Right 03/17/2020   Procedure: Node Dissection;  Surgeon: [name], MD;  Location: [facility];  Service: Thoracic;  Laterality: Right;  . TUBAL LIGATION      REVIEW OF SYSTEMS:   Review of Systems  Constitutional: Negative for appetite change, chills, fatigue, fever and unexpected weight change.  HENT: Negative for mouth sores, nosebleeds, sore throat and trouble swallowing.   Eyes: Negative for eye problems and icterus.  Respiratory: Positive for baseline dyspnea on exertion. Negative for cough, hemoptysis, and wheezing.   Cardiovascular: Negative for chest pain and leg swelling.  Gastrointestinal: Positive for belching. Negative for abdominal pain, constipation, diarrhea, nausea and vomiting.  Genitourinary: Negative for bladder incontinence, difficulty urinating, dysuria,  frequency and hematuria.   Musculoskeletal: Negative for back pain, gait problem, neck pain and neck stiffness.  Skin: Negative for itching and rash.  Neurological: Negative for dizziness, extremity weakness, gait problem, headaches, light-headedness and seizures.  Hematological: Negative for adenopathy. Does not bruise/bleed easily.  Psychiatric/Behavioral: Negative for confusion, depression and sleep disturbance. The patient is not nervous/anxious.     PHYSICAL EXAMINATION:  Blood pressure (!) 166/76, pulse 87, temperature 98.1 F (36.7 C), temperature source Tympanic, resp. rate 18, height 5\' 4"  (1.626 m), weight 170 lb (77.1 kg), SpO2 96 %.  ECOG PERFORMANCE STATUS: 0  Physical Exam  Constitutional: Oriented to person, place, and time and well-developed, well-nourished, and in no distress. No distress.  HENT:  Head: Normocephalic and atraumatic.  Mouth/Throat: Oropharynx is clear and moist. No oropharyngeal exudate.  Eyes: Conjunctivae are normal. Right eye exhibits no discharge. Left eye exhibits no discharge. No scleral icterus.  Neck: Normal range of motion. Neck supple.  Cardiovascular: Normal rate, regular rhythm, normal heart sounds and intact distal pulses.   Pulmonary/Chest: Effort normal. Quiet breath sounds in all lung fields. No respiratory distress. No wheezes. No rales.  Abdominal: Soft. Bowel sounds are normal. Exhibits no distension and no mass. There is no tenderness.  Musculoskeletal: Normal range of motion. Exhibits no edema.  Lymphadenopathy:    No cervical adenopathy.  Neurological: Alert and oriented to person, place, and time. Exhibits normal muscle tone. Gait normal. Coordination normal.  Skin: Skin is warm and dry. No rash noted. Not diaphoretic. No erythema. No pallor.  Psychiatric: Mood, memory and judgment normal.  Vitals reviewed.  LABORATORY DATA: Lab Results  Component Value Date   WBC 8.1 10/12/2020   HGB 11.9 (L) 10/12/2020   HCT 36.9  10/12/2020   MCV 89.6 10/12/2020   PLT 242 10/12/2020      Chemistry      Component Value Date/Time   NA 139 10/12/2020 0932   K 4.7 10/12/2020 0932   CL 105 10/12/2020 0932   CO2 30 10/12/2020 0932   BUN 16 10/12/2020 0932   CREATININE 1.10 (H) 10/12/2020 0932      Component Value Date/Time   CALCIUM 9.3 10/12/2020 0932   ALKPHOS 80 10/12/2020 0932   AST 20 10/12/2020 0932   ALT 16 10/12/2020 0932   BILITOT 0.6 10/12/2020 0932       RADIOGRAPHIC STUDIES:  CT Chest W Contrast  Result Date: 10/12/2020 CLINICAL DATA:  Primary Cancer Type: Lung Imaging Indication: Routine surveillance Interval therapy since last imaging? Yes Initial Cancer Diagnosis Date: 03/17/2020; Established by: Biopsy-proven Detailed Pathology: Stage IA non-small cell lung cancer, squamous cell carcinoma. Primary Tumor location: Right lower lobe. Surgeries: Right lower lobe superior segmentectomy 03/17/2020. Chemotherapy: No Immunotherapy? No Radiation therapy? No EXAM: CT CHEST WITH CONTRAST TECHNIQUE: Multidetector CT imaging of the chest was performed during intravenous contrast administration. CONTRAST:  56mL OMNIPAQUE IOHEXOL 300 MG/ML  SOLN COMPARISON:  Most recent CT chest 02/03/2020.  02/11/2020 PET-CT. FINDINGS: Cardiovascular: Heart size is normal. There is no  significant pericardial fluid, thickening or pericardial calcification. There is aortic atherosclerosis, as well as atherosclerosis of the great vessels of the mediastinum and the coronary arteries, including calcified atherosclerotic plaque in the left main, left anterior descending, left circumflex and right coronary arteries. Calcifications of the aortic valve. Mediastinum/Nodes: No pathologically enlarged mediastinal or hilar lymph nodes. Esophagus is unremarkable in appearance. No axillary lymphadenopathy. Lungs/Pleura: Postoperative changes of right lower lobe superior segmentectomy are noted. There is some regional architectural distortion in  the basal segments of the right lower lobe along with some adjacent pleural thickening and trace amount of loculated pleural fluid in the posterior and inferior aspect of the right hemithorax, presumably postoperative. No left pleural effusion. Multiple tiny 2-3 mm pulmonary nodules scattered throughout the lungs bilaterally, similar to the prior examination, nonspecific, but statistically likely to represent areas of mucoid impaction within terminal bronchioles. No other new suspicious appearing pulmonary nodules or masses are noted. No acute consolidative airspace disease. Chronic left apical pleuroparenchymal thickening and architectural distortion, similar to prior examinations, most compatible with areas of chronic post infectious or inflammatory scarring. Mild diffuse bronchial wall thickening with mild centrilobular and paraseptal emphysema. Upper Abdomen: Aortic atherosclerosis. Musculoskeletal: Old healed fractures of the posterior aspect of the right seventh and eighth ribs. There are no aggressive appearing lytic or blastic lesions noted in the visualized portions of the skeleton. IMPRESSION: 1. Status post right lower lobe superior segmentectomy with some postoperative changes in the right lung, but no findings to suggest residual disease or definite metastatic disease in the thorax on today's examination. 2. Aortic atherosclerosis, in addition to left main and 3 vessel coronary artery disease. Assessment for potential risk factor modification, dietary therapy or pharmacologic therapy may be warranted, if clinically indicated. 3. Diffuse bronchial wall thickening with mild centrilobular and paraseptal emphysema; imaging findings suggestive of underlying COPD. Electronically Signed   By: Vinnie Langton M.D.   On: 10/12/2020 10:51     ASSESSMENT/PLAN:  This is a very pleasant 74 year old Caucasian female diagnosed with stage Ia (T1, N0, M0) non-small cell lung cancer, squamous cell carcinoma.  She  presented with a right lower lobe lung nodule.  She was diagnosed in June 2021.  She is status post a right lower lobe superior segmentectomy and lymph node dissection under the care of Dr. Roxan Hockey on 03/17/2020.  The patient is currently on observation and is feeling fine.  The patient recently had a restaging CT scan performed.  Dr. Earlie Server personally and independently reviewed the scan and discussed the results with the patient today.  The scan did not show any evidence for disease progression.  Dr. Julien Nordmann recommends a follow-up CT scan the chest in 6 months.  We will see the patient back for follow-up visit in 6 months for evaluation and to review her scan results.  Dr. Julien Nordmann discussed that the 5 year survival is 80% for stage IA disease.   Encouraged the patient to continue with staying tobacco/cigarette free.   The patient was advised to call immediately if she has any concerning symptoms in the interval. The patient voices understanding of current disease status and treatment options and is in agreement with the current care plan. All questions were answered. The patient knows to call the clinic with any problems, questions or concerns. We can certainly see the patient much sooner if necessary      Orders Placed This Encounter  Procedures  . CT Chest W Contrast    Standing Status:   Future  Standing Expiration Date:   10/14/2021    Order Specific Question:   If indicated for the ordered procedure, I authorize the administration of contrast media per Radiology protocol    Answer:   Yes    Order Specific Question:   Preferred imaging location?    Answer:   [facility]  . CBC with Differential ([facility] Only)    Standing Status:   Future    Standing Expiration Date:   10/14/2021  . CMP ([facility] only)    Standing Status:   Future    Standing Expiration Date:   10/14/2021     Tobe Sos Demoni Parmar,  PA-C 10/14/20  ADDENDUM: Hematology/Oncology Attending: I had a face-to-face encounter with the patient today.  I recommended her care plan.  This is a very pleasant 74 years old white female with a stage Ia (T1 a, N0, M0) non-small cell lung cancer, squamous cell carcinoma status post right lower lobe superior segmentectomy with lymph node dissection in June 2021.  The patient is currently on observation and she is feeling fine today with no concerning complaints. She had repeat CT scan of the chest performed recently.  I personally and independently reviewed the scans and discussed the results with the patient today. Her scan showed no concerning findings for disease recurrence or metastasis. I recommended for the patient to continue on observation with repeat CT scan of the chest in 6 months. The patient was advised to call immediately if she has any other concerning symptoms in the interval.  Disclaimer: This note was dictated with voice recognition software. Similar sounding words can inadvertently be transcribed and may be missed upon review. Eilleen Kempf, MD 10/14/20

## 2020-10-14 ENCOUNTER — Inpatient Hospital Stay (HOSPITAL_BASED_OUTPATIENT_CLINIC_OR_DEPARTMENT_OTHER): Payer: Medicare HMO | Admitting: Physician Assistant

## 2020-10-14 ENCOUNTER — Other Ambulatory Visit: Payer: Self-pay

## 2020-10-14 VITALS — BP 166/76 | HR 87 | Temp 98.1°F | Resp 18 | Ht 64.0 in | Wt 170.0 lb

## 2020-10-14 DIAGNOSIS — C3431 Malignant neoplasm of lower lobe, right bronchus or lung: Secondary | ICD-10-CM

## 2020-10-18 ENCOUNTER — Telehealth: Payer: Self-pay | Admitting: Physician Assistant

## 2020-10-18 NOTE — Telephone Encounter (Signed)
Scheduled appts per 12/30 los. Pt confirmed appt date and time.

## 2020-10-26 ENCOUNTER — Encounter: Payer: Self-pay | Admitting: Thoracic Surgery (Cardiothoracic Vascular Surgery)

## 2020-10-26 ENCOUNTER — Other Ambulatory Visit: Payer: Self-pay

## 2020-10-26 ENCOUNTER — Ambulatory Visit: Payer: Medicare HMO | Admitting: Thoracic Surgery (Cardiothoracic Vascular Surgery)

## 2020-10-26 VITALS — BP 157/80 | HR 99 | Resp 20 | Ht 64.0 in | Wt 165.0 lb

## 2020-10-26 DIAGNOSIS — C3431 Malignant neoplasm of lower lobe, right bronchus or lung: Secondary | ICD-10-CM | POA: Diagnosis not present

## 2020-10-26 NOTE — Progress Notes (Signed)
Amy [name]       Amy [name],Amy [name]             [phone]       HPI: Amy [name] returns for a scheduled follow-up visit after lung resection  Amy [name] is a 75 year old woman with a history of tobacco abuse, COPD, hypertension, hyperlipidemia, CAD, stage III chronic kidney disease, depression, and a stage Ia squamous cell carcinoma of the right lower lobe.  I did a right lower lobe superior segmentectomy and node dissection for stage Ia squamous cell carcinoma in June 2021.  Her preoperative FEV1 was 1.02.  I last saw her in the office in July.  She was doing well at that time.  She was complaining of a lot of belching.  That has improved but she says she cannot eat fried foods.  She thinks it may be her gallbladder.  She has had some soreness in the right lower rib cage over the past few weeks.  She has not had to take anything for that.  She has gained about 25 lbs. since she quit smoking cigarettes. Past Medical History:  Diagnosis Date  . Anxiety   . COPD (chronic obstructive pulmonary disease) (Amy [name]) 2021  . Depression 01/28/2020  . Hyperlipidemia   . Hypertension   . Pneumonia   . 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  Status post right lower lobe superior segmentectomy    Current Outpatient Medications  Medication Sig Dispense Refill  . albuterol (VENTOLIN HFA) 108 (90 Base) MCG/ACT inhaler Inhale 1-2 puffs into the lungs every 6 (six) hours as needed for wheezing or shortness of breath.     . [employer] TURMERIC CURCUMIN PO Take 450 mg by mouth daily.     . Flaxseed, Linseed, (FLAX SEED OIL) 1300 MG CAPS See admin instructions.    Marland Kitchen ipratropium (ATROVENT) 0.06 % nasal spray 2 sprays in each nostril    . magnesium oxide (MAG-OX) 400 MG tablet Take 400 mg by mouth daily.    Amy [name] 3-6-9 CAPS Take 2 capsules by mouth daily.    Marland Kitchen omeprazole (PRILOSEC) 20 MG capsule TAKE ONE CAPSULE BY MOUTH  DAILY 30 capsule 0  . raloxifene (EVISTA) 60 MG tablet Take 60 mg by mouth daily.    . sertraline (ZOLOFT) 50 MG tablet Take 50 mg by mouth daily.    . simvastatin (ZOCOR) 40 MG tablet Take 40 mg by mouth every evening.     . traMADol (ULTRAM) 50 MG tablet 1 tablet as needed for severe pain (mild pain)    . traZODone (DESYREL) 50 MG tablet 1 tablet at bedtime as needed     No current facility-administered medications for this visit.    Physical Exam BP (!) 157/80 (BP Location: Right Arm, Patient Position: Sitting)   Pulse 99   Resp 20   Ht 5\' 4"  (1.626 m)   Wt 165 lb (74.8 kg)   SpO2 95% Comment: RA with mask on  BMI 28.31 kg/m  75 year old woman in no acute distress Alert and oriented x3 with no focal deficits Lungs diminished breath sounds bilaterally but no rales or wheezing Incisions well-healed Cardiac regular rate and rhythm  Diagnostic Tests: CT CHEST WITH CONTRAST  TECHNIQUE: Multidetector CT imaging of the chest was performed during intravenous contrast administration.  CONTRAST:  57mL OMNIPAQUE IOHEXOL 300 MG/ML  SOLN  COMPARISON:  Most recent CT chest  02/03/2020.  02/11/2020 PET-CT.  FINDINGS: Cardiovascular: Heart size is normal. There is no significant pericardial fluid, thickening or pericardial calcification. There is aortic atherosclerosis, as well as atherosclerosis of the great vessels of the mediastinum and the coronary arteries, including calcified atherosclerotic plaque in the left main, left anterior descending, left circumflex and right coronary arteries. Calcifications of the aortic valve.  Mediastinum/Nodes: No pathologically enlarged mediastinal or hilar lymph nodes. Esophagus is unremarkable in appearance. No axillary lymphadenopathy.  Lungs/Pleura: Postoperative changes of right lower lobe superior segmentectomy are noted. There is some regional architectural distortion in the basal segments of the right lower lobe along with some  adjacent pleural thickening and trace amount of loculated pleural fluid in the posterior and inferior aspect of the right hemithorax, presumably postoperative. No left pleural effusion. Multiple tiny 2-3 mm pulmonary nodules scattered throughout the lungs bilaterally, similar to the prior examination, nonspecific, but statistically likely to represent areas of mucoid impaction within terminal bronchioles. No other new suspicious appearing pulmonary nodules or masses are noted. No acute consolidative airspace disease. Chronic left apical pleuroparenchymal thickening and architectural distortion, similar to prior examinations, most compatible with areas of chronic post infectious or inflammatory scarring. Mild diffuse bronchial wall thickening with mild centrilobular and paraseptal emphysema.  Upper Abdomen: Aortic atherosclerosis.  Musculoskeletal: Old healed fractures of the posterior aspect of the right seventh and eighth ribs. There are no aggressive appearing lytic or blastic lesions noted in the visualized portions of the skeleton.  IMPRESSION: 1. Status post right lower lobe superior segmentectomy with some postoperative changes in the right lung, but no findings to suggest residual disease or definite metastatic disease in the thorax on today's examination. 2. Aortic atherosclerosis, in addition to left main and 3 vessel coronary artery disease. Assessment for potential risk factor modification, dietary therapy or pharmacologic therapy may be warranted, if clinically indicated. 3. Diffuse bronchial wall thickening with mild centrilobular and paraseptal emphysema; imaging findings suggestive of underlying COPD.   Electronically Signed   By: Amy [name] M.D.   On: 10/12/2020 10:51 I personally reviewed the CT images.  Postoperative findings are present.  Atherosclerosis thoracic aorta and coronaries is present.  There is no evidence of  recurrence.  Impression: Amy [name] is a 75 year old woman with a history of tobacco abuse, COPD, hypertension, hyperlipidemia, CAD, stage III chronic kidney disease, depression, and a stage Ia squamous cell carcinoma of the right lower lobe.  Squamous cell carcinoma lung-status post superior segmentectomy.  Did not require adjuvant therapy.  Initial follow-up scan in 6 months shows no evidence of recurrent disease.  She will continue be followed by Amy [name] per protocol.  Tobacco abuse-she has quit smoking.  She has had some weight gain since then.  Aortic atherosclerosis and coronary atherosclerosis-she is on simvastatin.  Asymptomatic.  Hypertension-blood pressure is elevated.  She previously had been on lisinopril but stopped that.  She will discuss with her primary physician Amy [name].  Plan: Follow-up with Amy [name] regarding blood pressure Follow-up with Amy [name] regarding lung cancer I will be happy to see Amy [name] back anytime in the future if I can be of any further assistance with her care  Amy Nakayama, MD [facility] [phone]

## 2020-12-28 DIAGNOSIS — Z1211 Encounter for screening for malignant neoplasm of colon: Secondary | ICD-10-CM | POA: Diagnosis not present

## 2020-12-28 DIAGNOSIS — N183 Chronic kidney disease, stage 3 unspecified: Secondary | ICD-10-CM | POA: Diagnosis not present

## 2020-12-28 DIAGNOSIS — E7800 Pure hypercholesterolemia, unspecified: Secondary | ICD-10-CM | POA: Diagnosis not present

## 2020-12-28 DIAGNOSIS — J449 Chronic obstructive pulmonary disease, unspecified: Secondary | ICD-10-CM | POA: Diagnosis not present

## 2020-12-28 DIAGNOSIS — R69 Illness, unspecified: Secondary | ICD-10-CM | POA: Diagnosis not present

## 2020-12-28 DIAGNOSIS — Z0000 Encounter for general adult medical examination without abnormal findings: Secondary | ICD-10-CM | POA: Diagnosis not present

## 2020-12-28 DIAGNOSIS — M810 Age-related osteoporosis without current pathological fracture: Secondary | ICD-10-CM | POA: Diagnosis not present

## 2020-12-28 DIAGNOSIS — G4700 Insomnia, unspecified: Secondary | ICD-10-CM | POA: Diagnosis not present

## 2020-12-28 DIAGNOSIS — R5383 Other fatigue: Secondary | ICD-10-CM | POA: Diagnosis not present

## 2020-12-28 DIAGNOSIS — L309 Dermatitis, unspecified: Secondary | ICD-10-CM | POA: Diagnosis not present

## 2020-12-28 DIAGNOSIS — I10 Essential (primary) hypertension: Secondary | ICD-10-CM | POA: Diagnosis not present

## 2021-02-23 DIAGNOSIS — R1310 Dysphagia, unspecified: Secondary | ICD-10-CM | POA: Diagnosis not present

## 2021-02-23 DIAGNOSIS — Z86010 Personal history of colonic polyps: Secondary | ICD-10-CM | POA: Diagnosis not present

## 2021-02-23 DIAGNOSIS — R142 Eructation: Secondary | ICD-10-CM | POA: Diagnosis not present

## 2021-03-01 ENCOUNTER — Other Ambulatory Visit (HOSPITAL_COMMUNITY): Payer: Self-pay | Admitting: Gastroenterology

## 2021-03-01 DIAGNOSIS — R1310 Dysphagia, unspecified: Secondary | ICD-10-CM

## 2021-03-08 ENCOUNTER — Ambulatory Visit (HOSPITAL_COMMUNITY)
Admission: RE | Admit: 2021-03-08 | Discharge: 2021-03-08 | Disposition: A | Discharge status: Home / Self Care | Payer: Medicare HMO | Source: Ambulatory Visit | Attending: Gastroenterology | Admitting: Gastroenterology

## 2021-03-08 ENCOUNTER — Other Ambulatory Visit: Payer: Self-pay

## 2021-03-08 DIAGNOSIS — R1310 Dysphagia, unspecified: Secondary | ICD-10-CM | POA: Insufficient documentation

## 2021-03-08 DIAGNOSIS — K224 Dyskinesia of esophagus: Secondary | ICD-10-CM | POA: Diagnosis not present

## 2021-03-08 DIAGNOSIS — K219 Gastro-esophageal reflux disease without esophagitis: Secondary | ICD-10-CM | POA: Diagnosis not present

## 2021-03-08 DIAGNOSIS — K449 Diaphragmatic hernia without obstruction or gangrene: Secondary | ICD-10-CM | POA: Diagnosis not present

## 2021-03-15 DIAGNOSIS — N183 Chronic kidney disease, stage 3 unspecified: Secondary | ICD-10-CM | POA: Diagnosis not present

## 2021-03-15 DIAGNOSIS — R69 Illness, unspecified: Secondary | ICD-10-CM | POA: Diagnosis not present

## 2021-03-15 DIAGNOSIS — M8580 Other specified disorders of bone density and structure, unspecified site: Secondary | ICD-10-CM | POA: Diagnosis not present

## 2021-03-15 DIAGNOSIS — E7800 Pure hypercholesterolemia, unspecified: Secondary | ICD-10-CM | POA: Diagnosis not present

## 2021-03-15 DIAGNOSIS — M1990 Unspecified osteoarthritis, unspecified site: Secondary | ICD-10-CM | POA: Diagnosis not present

## 2021-03-15 DIAGNOSIS — G4700 Insomnia, unspecified: Secondary | ICD-10-CM | POA: Diagnosis not present

## 2021-03-15 DIAGNOSIS — M810 Age-related osteoporosis without current pathological fracture: Secondary | ICD-10-CM | POA: Diagnosis not present

## 2021-03-15 DIAGNOSIS — J449 Chronic obstructive pulmonary disease, unspecified: Secondary | ICD-10-CM | POA: Diagnosis not present

## 2021-03-15 DIAGNOSIS — I10 Essential (primary) hypertension: Secondary | ICD-10-CM | POA: Diagnosis not present

## 2021-04-14 ENCOUNTER — Ambulatory Visit (HOSPITAL_COMMUNITY)
Admission: RE | Admit: 2021-04-14 | Discharge: 2021-04-14 | Disposition: A | Discharge status: Home / Self Care | Payer: Medicare HMO | Source: Ambulatory Visit | Attending: Physician Assistant | Admitting: Physician Assistant

## 2021-04-14 ENCOUNTER — Encounter (HOSPITAL_COMMUNITY): Payer: Self-pay

## 2021-04-14 ENCOUNTER — Other Ambulatory Visit: Payer: Self-pay

## 2021-04-14 ENCOUNTER — Inpatient Hospital Stay: Payer: Medicare HMO | Attending: Internal Medicine

## 2021-04-14 DIAGNOSIS — J439 Emphysema, unspecified: Secondary | ICD-10-CM | POA: Diagnosis not present

## 2021-04-14 DIAGNOSIS — C3431 Malignant neoplasm of lower lobe, right bronchus or lung: Secondary | ICD-10-CM | POA: Diagnosis not present

## 2021-04-14 DIAGNOSIS — R911 Solitary pulmonary nodule: Secondary | ICD-10-CM | POA: Diagnosis not present

## 2021-04-14 DIAGNOSIS — Z79899 Other long term (current) drug therapy: Secondary | ICD-10-CM | POA: Diagnosis not present

## 2021-04-14 DIAGNOSIS — I10 Essential (primary) hypertension: Secondary | ICD-10-CM | POA: Diagnosis not present

## 2021-04-14 DIAGNOSIS — Z85118 Personal history of other malignant neoplasm of bronchus and lung: Secondary | ICD-10-CM | POA: Diagnosis not present

## 2021-04-14 DIAGNOSIS — C3490 Malignant neoplasm of unspecified part of unspecified bronchus or lung: Secondary | ICD-10-CM | POA: Diagnosis not present

## 2021-04-14 DIAGNOSIS — I700 Atherosclerosis of aorta: Secondary | ICD-10-CM | POA: Diagnosis not present

## 2021-04-14 DIAGNOSIS — R918 Other nonspecific abnormal finding of lung field: Secondary | ICD-10-CM | POA: Diagnosis not present

## 2021-04-14 LAB — CMP (CANCER CENTER ONLY)
ALT: 11 U/L (ref 0–44)
AST: 20 U/L (ref 15–41)
Albumin: 3.7 g/dL (ref 3.5–5.0)
Alkaline Phosphatase: 93 U/L (ref 38–126)
Anion gap: 6 (ref 5–15)
BUN: 20 mg/dL (ref 8–23)
CO2: 29 mmol/L (ref 22–32)
Calcium: 9.9 mg/dL (ref 8.9–10.3)
Chloride: 105 mmol/L (ref 98–111)
Creatinine: 1.1 mg/dL — ABNORMAL HIGH (ref 0.44–1.00)
GFR, Estimated: 52 mL/min — ABNORMAL LOW (ref >60)
Glucose, Bld: 96 mg/dL (ref 70–99)
Potassium: 4.7 mmol/L (ref 3.5–5.1)
Sodium: 140 mmol/L (ref 135–145)
Total Bilirubin: 0.5 mg/dL (ref 0.3–1.2)
Total Protein: 7.2 g/dL (ref 6.5–8.1)

## 2021-04-14 LAB — CBC WITH DIFFERENTIAL (CANCER CENTER ONLY)
Abs Immature Granulocytes: 0.01 10*3/uL (ref 0.00–0.07)
Basophils Absolute: 0 10*3/uL (ref 0.0–0.1)
Basophils Relative: 0 %
Eosinophils Absolute: 0.3 10*3/uL (ref 0.0–0.5)
Eosinophils Relative: 4 %
HCT: 39.8 % (ref 36.0–46.0)
Hemoglobin: 13.2 g/dL (ref 12.0–15.0)
Immature Granulocytes: 0 %
Lymphocytes Relative: 22 %
Lymphs Abs: 1.7 10*3/uL (ref 0.7–4.0)
MCH: 30.3 pg (ref 26.0–34.0)
MCHC: 33.2 g/dL (ref 30.0–36.0)
MCV: 91.5 fL (ref 80.0–100.0)
Monocytes Absolute: 0.6 10*3/uL (ref 0.1–1.0)
Monocytes Relative: 7 %
Neutro Abs: 5.3 10*3/uL (ref 1.7–7.7)
Neutrophils Relative %: 67 %
Platelet Count: 270 10*3/uL (ref 150–400)
RBC: 4.35 MIL/uL (ref 3.87–5.11)
RDW: 13.3 % (ref 11.5–15.5)
WBC Count: 7.9 10*3/uL (ref 4.0–10.5)
nRBC: 0 % (ref 0.0–0.2)

## 2021-04-14 MED ORDER — IOHEXOL 300 MG/ML  SOLN
100.0000 mL | Freq: Once | INTRAMUSCULAR | Status: AC | PRN
  Administered 2021-04-14: 75 mL via INTRAVENOUS

## 2021-04-14 MED ORDER — SODIUM CHLORIDE (PF) 0.9 % IJ SOLN
INTRAMUSCULAR | Status: AC
  Filled 2021-04-14: qty 50

## 2021-04-19 ENCOUNTER — Other Ambulatory Visit: Payer: Self-pay

## 2021-04-19 ENCOUNTER — Inpatient Hospital Stay: Payer: Medicare HMO | Attending: Internal Medicine | Admitting: Internal Medicine

## 2021-04-19 VITALS — BP 157/73 | HR 75 | Temp 97.5°F | Resp 20 | Ht 64.0 in | Wt 171.0 lb

## 2021-04-19 DIAGNOSIS — J449 Chronic obstructive pulmonary disease, unspecified: Secondary | ICD-10-CM | POA: Diagnosis not present

## 2021-04-19 DIAGNOSIS — C3431 Malignant neoplasm of lower lobe, right bronchus or lung: Secondary | ICD-10-CM

## 2021-04-19 DIAGNOSIS — Z85118 Personal history of other malignant neoplasm of bronchus and lung: Secondary | ICD-10-CM | POA: Diagnosis not present

## 2021-04-19 DIAGNOSIS — C3490 Malignant neoplasm of unspecified part of unspecified bronchus or lung: Secondary | ICD-10-CM | POA: Diagnosis not present

## 2021-04-19 NOTE — Progress Notes (Signed)
[facility] [phone]   [phone]  OFFICE PROGRESS NOTE  [name], MD [place] [address]  DIAGNOSIS: Stage IA (T1, N0, M0) non-small cell lung cancer, squamous cell carcinoma.  She presented with a right lower lobe lung nodule.  She was diagnosed in June 2021.    PRIOR THERAPY: Right lower lobe superior segmentectomy and lymph node dissection under the care of Dr. Roxan Hockey on 03/17/2020.   CURRENT THERAPY: Observation  INTERVAL HISTORY: Amy [name] 75 y.o. female returns to the clinic today for 32-month follow-up visit.  The patient is feeling fine today with no concerning complaints except for fatigue and shortness of breath with exertion.  She denied having any chest pain, cough or hemoptysis.  She denied having any fever or chills.  She has no nausea, vomiting, diarrhea or constipation.  She denied having any headache or visual changes.  She had repeat CT scan of the chest performed recently and she is here for evaluation and discussion of her risk her results.  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  Status post right lower lobe superior segmentectomy    ALLERGIES:  has No Known Allergies.  MEDICATIONS:  Current Outpatient Medications  Medication Sig Dispense Refill   albuterol (VENTOLIN HFA) 108 (90 Base) MCG/ACT inhaler Inhale 1-2 puffs into the lungs every 6 (six) hours as needed for wheezing or shortness of breath.      CVS TURMERIC CURCUMIN PO Take 450 mg by mouth daily.      Flaxseed, Linseed, (FLAX SEED OIL) 1300 MG CAPS See admin instructions.     ipratropium (ATROVENT) 0.06 % nasal spray 2 sprays in each nostril     magnesium oxide  (MAG-OX) 400 MG tablet Take 400 mg by mouth daily.     Omega 3-6-9 CAPS Take 2 capsules by mouth daily.     raloxifene (EVISTA) 60 MG tablet Take 60 mg by mouth daily.     sertraline (ZOLOFT) 50 MG tablet Take 50 mg by mouth daily.     simvastatin (ZOCOR) 40 MG tablet Take 40 mg by mouth every evening.      traMADol (ULTRAM) 50 MG tablet 1 tablet as needed for severe pain (mild pain)     traZODone (DESYREL) 50 MG tablet 1 tablet at bedtime as needed     No current facility-administered medications for this visit.    SURGICAL HISTORY:  Past Surgical History:  Procedure Laterality Date   APPENDECTOMY     EYE SURGERY     INTERCOSTAL NERVE BLOCK Right 03/17/2020   Procedure: Intercostal Nerve Block;  Surgeon: [name], MD;  Location: [facility];  Service: Thoracic;  Laterality: Right;   NODE DISSECTION Right 03/17/2020   Procedure: Node Dissection;  Surgeon: [name], MD;  Location: [facility];  Service: Thoracic;  Laterality: Right;   TUBAL LIGATION      REVIEW OF SYSTEMS:  A comprehensive review of systems was negative except for: Constitutional: positive for fatigue Respiratory: positive for dyspnea on exertion   PHYSICAL EXAMINATION: General appearance: alert, cooperative, fatigued, and no distress Head: Normocephalic, without obvious abnormality, atraumatic Neck: no adenopathy,  no JVD, supple, symmetrical, trachea midline, and thyroid not enlarged, symmetric, no tenderness/mass/nodules Lymph nodes: Cervical, supraclavicular, and axillary nodes normal. Resp: clear to auscultation bilaterally Back: symmetric, no curvature. ROM normal. No CVA tenderness. Cardio: regular rate and rhythm, S1, S2 normal, no murmur, click, rub or gallop GI: soft, non-tender; bowel sounds normal; no masses,  no organomegaly Extremities: extremities normal, atraumatic, no cyanosis or edema  ECOG PERFORMANCE STATUS: 1 - Symptomatic but completely ambulatory  Blood pressure (!) 157/73, pulse  75, temperature (!) 97.5 F (36.4 C), temperature source Tympanic, resp. rate 20, height 5\' 4"  (1.626 m), weight 171 lb (77.6 kg), SpO2 98 %.  LABORATORY DATA: Lab Results  Component Value Date   WBC 7.9 04/14/2021   HGB 13.2 04/14/2021   HCT 39.8 04/14/2021   MCV 91.5 04/14/2021   PLT 270 04/14/2021      Chemistry      Component Value Date/Time   NA 140 04/14/2021 0941   K 4.7 04/14/2021 0941   CL 105 04/14/2021 0941   CO2 29 04/14/2021 0941   BUN 20 04/14/2021 0941   CREATININE 1.10 (H) 04/14/2021 0941      Component Value Date/Time   CALCIUM 9.9 04/14/2021 0941   ALKPHOS 93 04/14/2021 0941   AST 20 04/14/2021 0941   ALT 11 04/14/2021 0941   BILITOT 0.5 04/14/2021 0941       RADIOGRAPHIC STUDIES: CT Chest W Contrast  Result Date: 04/14/2021 CLINICAL DATA:  Primary Cancer Type: Lung Imaging Indication: Routine surveillance Interval therapy since last imaging? No Initial Cancer Diagnosis Date: 03/17/2020; Established by: Biopsy-proven Detailed Pathology: Stage IA non-small cell lung cancer, squamous cell carcinoma. Primary Tumor location:  Right lower lobe. Surgeries: Right lower lobe superior segmentectomy 03/17/2020. Chemotherapy: No Immunotherapy? No Radiation therapy? No EXAM: CT CHEST WITH CONTRAST TECHNIQUE: Multidetector CT imaging of the chest was performed during intravenous contrast administration. CONTRAST:  12mL OMNIPAQUE IOHEXOL 300 MG/ML  SOLN COMPARISON:  Most recent CT chest 04/14/2021.  02/11/2020 PET-CT. FINDINGS: Cardiovascular: The heart size is normal. No substantial pericardial effusion. Coronary artery calcification is evident. Atherosclerotic calcification is noted in the wall of the thoracic aorta. Mediastinum/Nodes: No mediastinal lymphadenopathy. There is no hilar lymphadenopathy. Surgical changes noted inferior right hilar region. The esophagus has normal imaging features. There is no axillary lymphadenopathy. Lungs/Pleura: Centrilobular and paraseptal  emphysema evident. Stable appearance of prominent left apical pleuroparenchymal scarring. Architectural distortion and scarring in the right parahilar lung is again noted. The loculated pleural fluid seen posteriorly on the right previously has decreased in the interval. Scattered tiny bilateral pulmonary nodules including a relatively prominent 4 mm left lower lobe perifissural nodule on 49/5 are stable. No new suspicious pulmonary nodule or mass on today's study. No focal airspace consolidation. No pleural effusion. Upper Abdomen: Unremarkable Musculoskeletal: No worrisome lytic or sclerotic osseous abnormality. IMPRESSION: 1. Stable exam. No new or progressive interval findings to suggest recurrent/metastatic disease. 2. Interval decrease in the loculated pleural fluid seen posteriorly on the right. 3. Stable appearance of post treatment changes in the right parahilar lung. 4. Aortic Atherosclerosis (ICD10-I70.0) and Emphysema (ICD10-J43.9). Electronically Signed   By: Misty Stanley M.D.   On: 04/14/2021 11:13    ASSESSMENT AND PLAN: This is a very pleasant 75 years old white female with history of stage IA (T1 a, N0, M0) non-small cell lung cancer, squamous cell carcinoma presented with right lower lobe lung nodule diagnosed in June 2021 status post right lower lobe superior segmentectomy with lymph  node dissection under the care of Dr. Roxan Hockey on March 17, 2020. The patient is currently on observation and she is feeling fine today with no concerning complaints. She had repeat CT scan of the chest performed recently.  I personally and independently reviewed the scans and discussed the results with the patient today. Her scan showed no concerning findings for disease recurrence or metastasis. I recommended for her to continue on observation with repeat CT scan of the chest in 6 months. The patient was advised to call immediately if she has any concerning symptoms in the interval. The patient voices  understanding of current disease status and treatment options and is in agreement with the current care plan.  All questions were answered. The patient knows to call the clinic with any problems, questions or concerns. We can certainly see the patient much sooner if necessary.   Disclaimer: This note was dictated with voice recognition software. Similar sounding words can inadvertently be transcribed and may not be corrected upon review.

## 2021-04-21 ENCOUNTER — Telehealth: Payer: Self-pay | Admitting: Internal Medicine

## 2021-04-21 NOTE — Telephone Encounter (Signed)
Scheduled appointment per 07/05 los. Patient is aware. 

## 2021-05-19 DIAGNOSIS — D128 Benign neoplasm of rectum: Secondary | ICD-10-CM | POA: Diagnosis not present

## 2021-05-19 DIAGNOSIS — D120 Benign neoplasm of cecum: Secondary | ICD-10-CM | POA: Diagnosis not present

## 2021-05-19 DIAGNOSIS — K5730 Diverticulosis of large intestine without perforation or abscess without bleeding: Secondary | ICD-10-CM | POA: Diagnosis not present

## 2021-05-19 DIAGNOSIS — K644 Residual hemorrhoidal skin tags: Secondary | ICD-10-CM | POA: Diagnosis not present

## 2021-05-19 DIAGNOSIS — D125 Benign neoplasm of sigmoid colon: Secondary | ICD-10-CM | POA: Diagnosis not present

## 2021-05-19 DIAGNOSIS — K648 Other hemorrhoids: Secondary | ICD-10-CM | POA: Diagnosis not present

## 2021-05-19 DIAGNOSIS — Z86010 Personal history of colonic polyps: Secondary | ICD-10-CM | POA: Diagnosis not present

## 2021-05-19 DIAGNOSIS — K621 Rectal polyp: Secondary | ICD-10-CM | POA: Diagnosis not present

## 2021-05-24 DIAGNOSIS — D120 Benign neoplasm of cecum: Secondary | ICD-10-CM | POA: Diagnosis not present

## 2021-05-24 DIAGNOSIS — K621 Rectal polyp: Secondary | ICD-10-CM | POA: Diagnosis not present

## 2021-05-24 DIAGNOSIS — D128 Benign neoplasm of rectum: Secondary | ICD-10-CM | POA: Diagnosis not present

## 2021-05-24 DIAGNOSIS — D125 Benign neoplasm of sigmoid colon: Secondary | ICD-10-CM | POA: Diagnosis not present

## 2021-06-02 IMAGING — MG DIGITAL SCREENING BILAT W/ TOMO W/ CAD
8 series · 8 of 24 positions shown · non-contrast
Comparison: Previous exam(s).

CLINICAL DATA: Screening.

EXAM:
DIGITAL SCREENING BILATERAL MAMMOGRAM WITH TOMO AND CAD

[L MLO synth-2D]
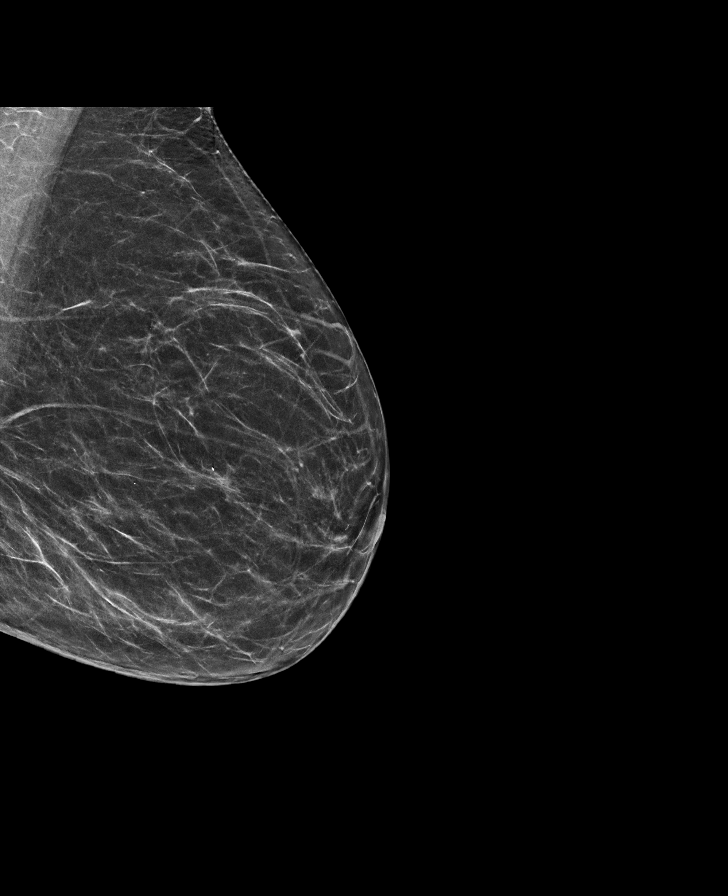

[L CC synth-2D]
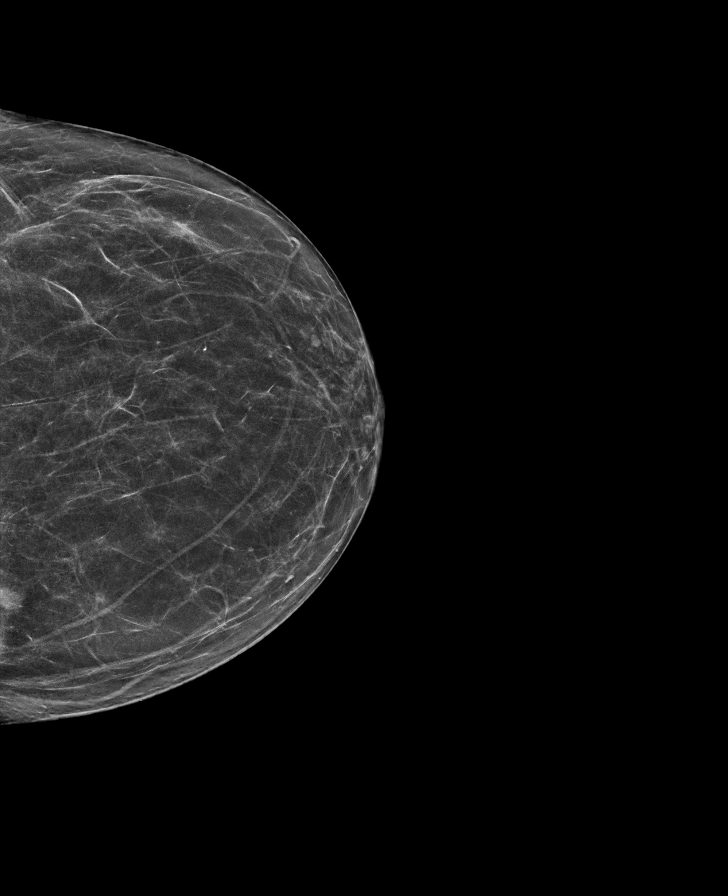

[R CC synth-2D]
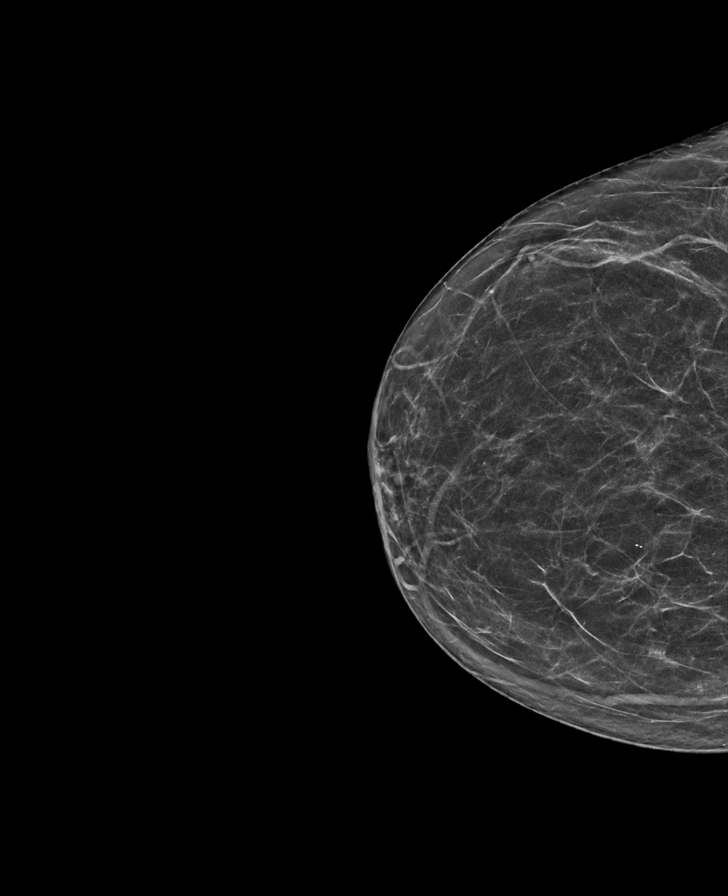

[R MLO synth-2D]
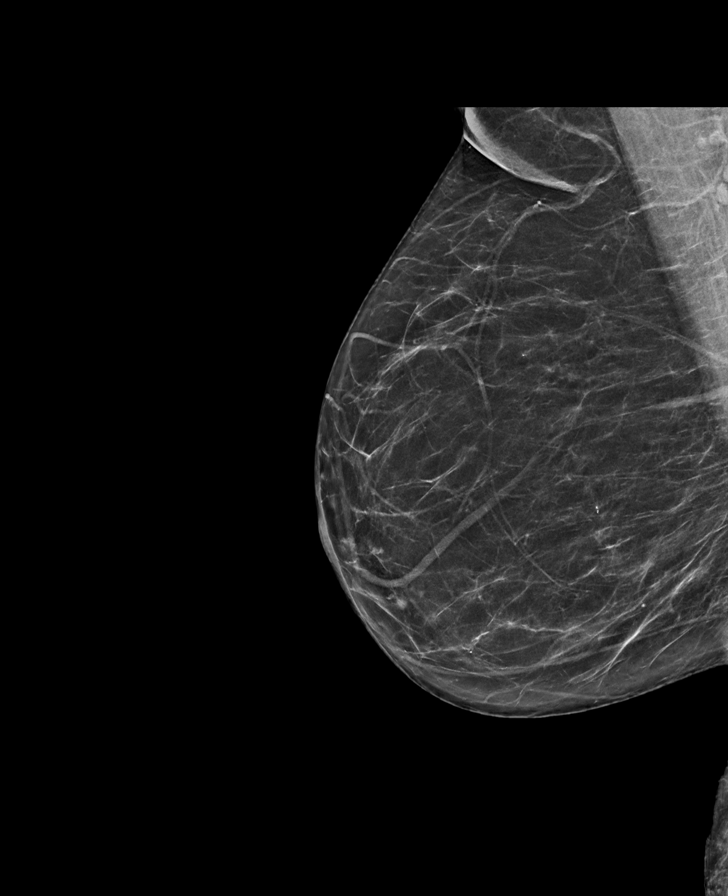

[R MLO tomo · tomo slice 35/68.0]
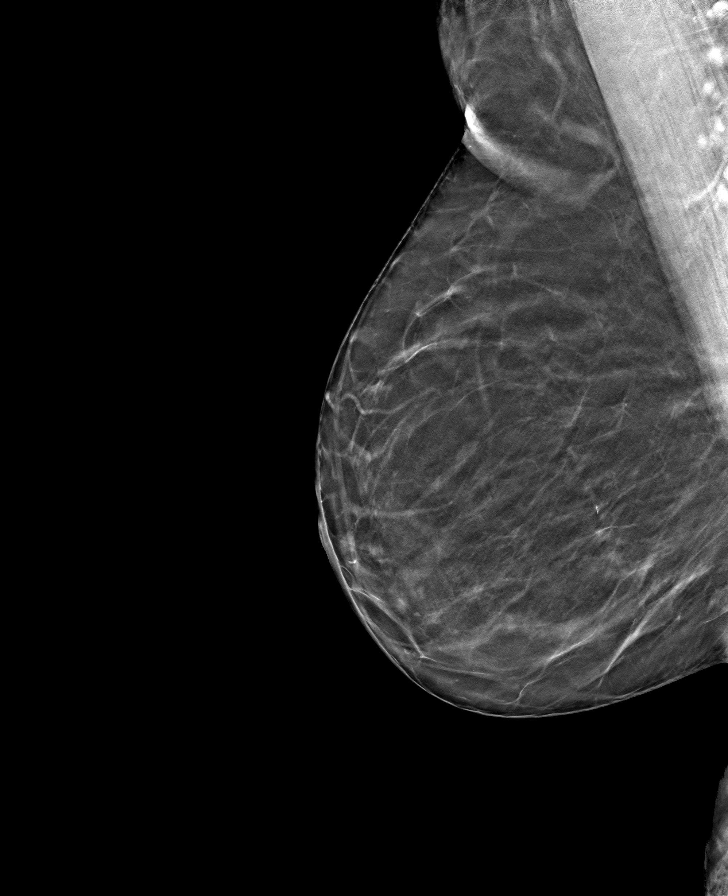

[R CC tomo · tomo slice 31/60.0]
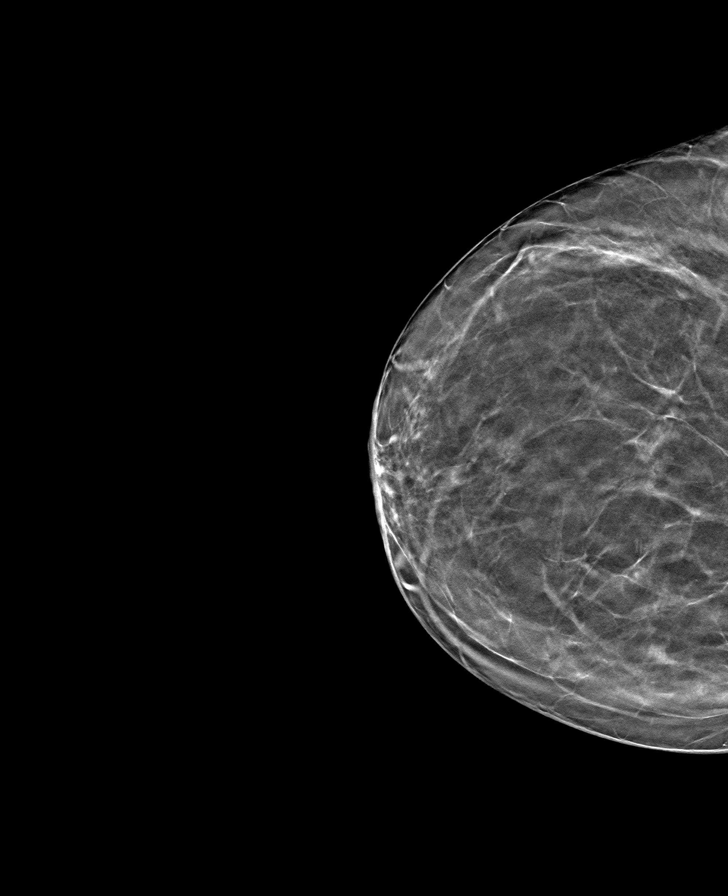

[L CC tomo · tomo slice 31/61.0]
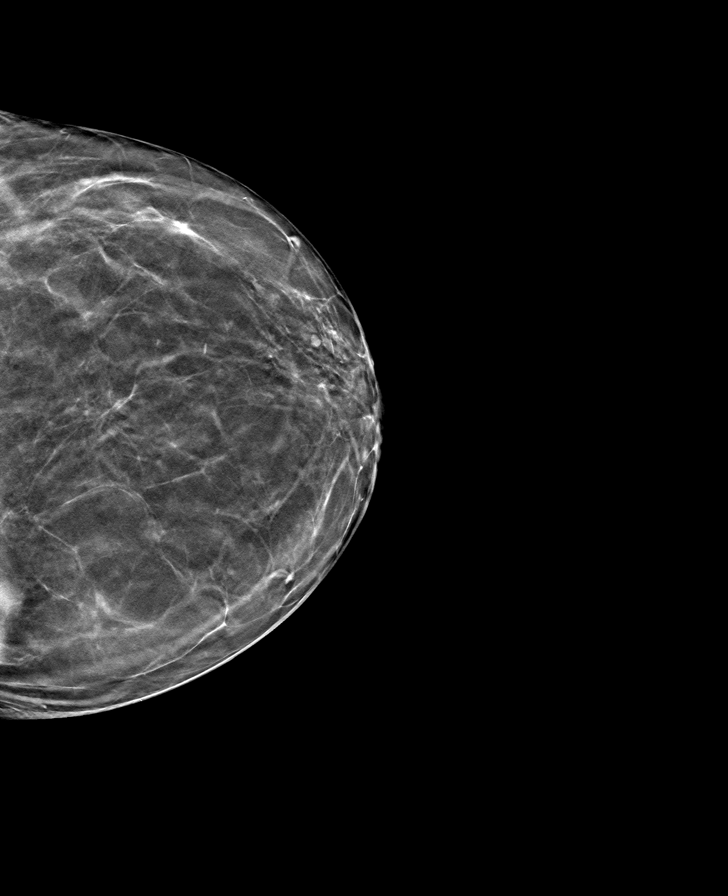

[L MLO tomo · tomo slice 33/65.0]
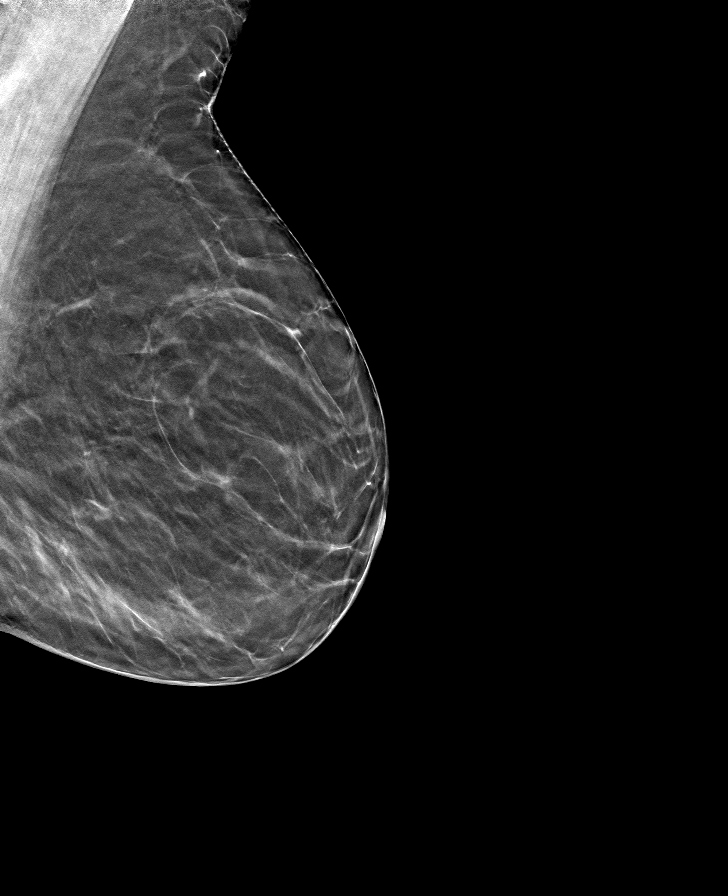

[8 of 24 positions shown; findings below may reference images not displayed]

ACR Breast Density Category b: There are scattered areas of
fibroglandular density.
FINDINGS: There are no findings suspicious for malignancy. Images were
processed with CAD.
IMPRESSION: No mammographic evidence of malignancy. A result letter of this
screening mammogram will be mailed directly to the patient.

RECOMMENDATION:
Screening mammogram in one year. (Code:[id number])

BI-RADS CATEGORY  1: Negative.

## 2021-06-14 ENCOUNTER — Other Ambulatory Visit: Payer: Self-pay | Admitting: Family Medicine

## 2021-06-14 DIAGNOSIS — Z1231 Encounter for screening mammogram for malignant neoplasm of breast: Secondary | ICD-10-CM

## 2021-07-14 ENCOUNTER — Ambulatory Visit
Admission: RE | Admit: 2021-07-14 | Discharge: 2021-07-14 | Disposition: A | Discharge status: Home / Self Care | Payer: Medicare HMO | Source: Ambulatory Visit | Attending: Family Medicine | Admitting: Family Medicine

## 2021-07-14 ENCOUNTER — Other Ambulatory Visit: Payer: Self-pay

## 2021-07-14 DIAGNOSIS — Z1231 Encounter for screening mammogram for malignant neoplasm of breast: Secondary | ICD-10-CM | POA: Diagnosis not present

## 2021-08-17 DIAGNOSIS — G4700 Insomnia, unspecified: Secondary | ICD-10-CM | POA: Diagnosis not present

## 2021-08-17 DIAGNOSIS — J439 Emphysema, unspecified: Secondary | ICD-10-CM | POA: Diagnosis not present

## 2021-08-17 DIAGNOSIS — R32 Unspecified urinary incontinence: Secondary | ICD-10-CM | POA: Diagnosis not present

## 2021-08-17 DIAGNOSIS — E785 Hyperlipidemia, unspecified: Secondary | ICD-10-CM | POA: Diagnosis not present

## 2021-08-17 DIAGNOSIS — M8580 Other specified disorders of bone density and structure, unspecified site: Secondary | ICD-10-CM | POA: Diagnosis not present

## 2021-08-17 DIAGNOSIS — I700 Atherosclerosis of aorta: Secondary | ICD-10-CM | POA: Diagnosis not present

## 2021-08-17 DIAGNOSIS — M1990 Unspecified osteoarthritis, unspecified site: Secondary | ICD-10-CM | POA: Diagnosis not present

## 2021-08-17 DIAGNOSIS — K219 Gastro-esophageal reflux disease without esophagitis: Secondary | ICD-10-CM | POA: Diagnosis not present

## 2021-08-17 DIAGNOSIS — I2510 Atherosclerotic heart disease of native coronary artery without angina pectoris: Secondary | ICD-10-CM | POA: Diagnosis not present

## 2021-08-17 DIAGNOSIS — R69 Illness, unspecified: Secondary | ICD-10-CM | POA: Diagnosis not present

## 2021-08-17 DIAGNOSIS — I10 Essential (primary) hypertension: Secondary | ICD-10-CM | POA: Diagnosis not present

## 2021-08-17 DIAGNOSIS — M810 Age-related osteoporosis without current pathological fracture: Secondary | ICD-10-CM | POA: Diagnosis not present

## 2021-08-23 DIAGNOSIS — K224 Dyskinesia of esophagus: Secondary | ICD-10-CM | POA: Diagnosis not present

## 2021-10-07 NOTE — Progress Notes (Signed)
[name]  [name], MD [place] [address]  DIAGNOSIS: Stage Ia (T1, N0, M0) non-small cell lung cancer, squamous cell carcinoma.  She presented with a right lower lobe lung nodule.  She was diagnosed in June 2021.   PRIOR THERAPY:  Right lower lobe superior segmentectomy and lymph node dissection under the care of Dr. Roxan Hockey on 03/17/2020.  CURRENT THERAPY: Observation  INTERVAL HISTORY: Amy [name] 75 y.o. female returns to the clinic today for a follow up visit. The patient is feeling well today without any concerning complaints except since her surgery last year, she has been having an ongoing issue with a dry cough after eating, belching, and the sensation of food being stuck in her throat. She takes a PPI reportedly. She sees [facility] and had a barium swallow study performed on 03/08/21 which showed moderate esophageal dysmotility with impaired distal progression of contrast in the mid esophagus. There is also a small hiatal hernia and mild gastroesophageal reflux seen spontaneously and with provocative maneuvers to the level of the distal esophagus.  The patient is status post a right lower lobe superior segmentectomy and lymph node dissection under the care of Dr. Roxan Hockey. This was performed on 03/17/20.  Denies any fever, chills, night sweats, or weight loss. Denies any chest pain or hemoptysis. She reports her baseline dyspnea on exertion and with activities such as walking or doing housework. She smokes one cigarette "every now and then". Denies any nausea, vomiting, diarrhea, or constipation. Denies any headache or visual changes.  The patient recently had a restaging CT scan performed. The patient is here today for evaluation and to review her scan results.   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  Status post right lower lobe superior segmentectomy    ALLERGIES:  has No Known Allergies.  MEDICATIONS:  Current Outpatient Medications  Medication Sig Dispense Refill   albuterol (VENTOLIN HFA) 108 (90 Base) MCG/ACT inhaler Inhale 1-2 puffs into the lungs every 6 (six) hours as needed for wheezing or shortness of breath.      [employer] TURMERIC CURCUMIN PO Take 450 mg by mouth daily.      Flaxseed, Linseed, (FLAX SEED OIL) 1300 MG CAPS See admin instructions.     ipratropium (ATROVENT) 0.06 % nasal spray 2 sprays in each nostril     magnesium oxide (MAG-OX) 400 MG tablet Take 400 mg by mouth daily.     Omega 3-6-9 CAPS Take 2 capsules by mouth daily.     raloxifene (EVISTA) 60 MG tablet Take 60 mg by mouth daily.     sertraline (ZOLOFT) 50 MG tablet Take 50 mg by mouth daily.     simvastatin (ZOCOR) 40 MG tablet Take 40 mg by mouth every evening.      traMADol (ULTRAM) 50 MG tablet 1 tablet as needed for severe pain (mild pain)     traZODone (DESYREL) 50 MG tablet 1 tablet at bedtime as needed     No current facility-administered medications for this visit.    SURGICAL HISTORY:  Past Surgical History:  Procedure Laterality Date   APPENDECTOMY     EYE SURGERY  INTERCOSTAL NERVE BLOCK Right 03/17/2020   Procedure: Intercostal Nerve Block;  Surgeon: [name], MD;  Location: [facility];  Service: Thoracic;  Laterality: Right;   NODE DISSECTION Right 03/17/2020   Procedure: Node Dissection;  Surgeon: [name], MD;  Location: [facility];  Service: Thoracic;  Laterality: Right;   TUBAL LIGATION      REVIEW OF SYSTEMS:   Review of Systems  Constitutional: Negative for appetite change, chills, fatigue, fever and unexpected weight change.  HENT: Negative for mouth sores, nosebleeds, sore throat and trouble swallowing.    Eyes: Negative for eye problems and icterus.  Respiratory: Positive for dyspnea on exertion and chronic cough. Negative for hemoptysis and wheezing.   Cardiovascular: Negative for chest pain and leg swelling.  Gastrointestinal: Positive for GERD and belching. Negative for abdominal pain, constipation, diarrhea, nausea and vomiting.  Genitourinary: Negative for bladder incontinence, difficulty urinating, dysuria, frequency and hematuria.   Musculoskeletal: Negative for back pain, gait problem, neck pain and neck stiffness.  Skin: Negative for itching and rash.  Neurological: Negative for dizziness, extremity weakness, gait problem, headaches, light-headedness and seizures.  Hematological: Negative for adenopathy. Does not bruise/bleed easily.  Psychiatric/Behavioral: Negative for confusion, depression and sleep disturbance. The patient is not nervous/anxious.     PHYSICAL EXAMINATION:  There were no vitals taken for this visit.  ECOG PERFORMANCE STATUS: 0-1  Physical Exam  Constitutional: Oriented to person, place, and time and well-developed, well-nourished, and in no distress.  HENT:  Head: Normocephalic and atraumatic.  Mouth/Throat: Oropharynx is clear and moist. No oropharyngeal exudate.  Eyes: Conjunctivae are normal. Right eye exhibits no discharge. Left eye exhibits no discharge. No scleral icterus.  Neck: Normal range of motion. Neck supple.  Cardiovascular: Normal rate, regular rhythm, normal heart sounds and intact distal pulses.   Pulmonary/Chest: Effort normal. Quiet breath sounds bilaterally. No respiratory distress. No wheezes. No rales.  Abdominal: Soft. Bowel sounds are normal. Exhibits no distension and no mass. There is no tenderness.  Musculoskeletal: Normal range of motion. Exhibits no edema.  Lymphadenopathy:    No cervical adenopathy.  Neurological: Alert and oriented to person, place, and time. Exhibits normal muscle tone. Gait normal. Coordination normal.   Skin: Skin is warm and dry. No rash noted. Not diaphoretic. No erythema. No pallor.  Psychiatric: Mood, memory and judgment normal.  Vitals reviewed.  LABORATORY DATA: Lab Results  Component Value Date   WBC 7.9 04/14/2021   HGB 13.2 04/14/2021   HCT 39.8 04/14/2021   MCV 91.5 04/14/2021   PLT 270 04/14/2021      Chemistry      Component Value Date/Time   NA 140 04/14/2021 0941   K 4.7 04/14/2021 0941   CL 105 04/14/2021 0941   CO2 29 04/14/2021 0941   BUN 20 04/14/2021 0941   CREATININE 1.10 (H) 04/14/2021 0941      Component Value Date/Time   CALCIUM 9.9 04/14/2021 0941   ALKPHOS 93 04/14/2021 0941   AST 20 04/14/2021 0941   ALT 11 04/14/2021 0941   BILITOT 0.5 04/14/2021 0941       RADIOGRAPHIC STUDIES:  No results found.   ASSESSMENT/PLAN:  This is a very pleasant 75 year old Caucasian female diagnosed with stage Ia (T1, N0, M0) non-small cell lung cancer, squamous cell carcinoma.  She presented with a right lower lobe lung nodule.  She was diagnosed in June 2021.  She is status post a right lower lobe superior segmentectomy and lymph node dissection under the  care of Dr. Roxan Hockey on 03/17/2020.   The patient is currently on observation and is feeling fine.   The patient recently had a restaging CT scan performed.  Dr. Earlie Server personally and independently reviewed the scan and discussed the results with the patient today.  The scan did not show any evidence for disease progression.   Dr. Julien Nordmann recommends a follow-up CT scan the chest in 6 months.   We will see the patient back for follow-up visit in 6 months for evaluation and to review her scan results.   Encouraged the patient to stay tobacco/cigarette free. Dr. Julien Nordmann strongly encouraged the patient to quit smoking.   For her reflux symptoms, dysphagia, and belching, I encouraged her to follow back up with GI to see if they have any additional recommendations for management.   The patient was  advised to call immediately if she has any concerning symptoms in the interval. The patient voices understanding of current disease status and treatment options and is in agreement with the current care plan. All questions were answered. The patient knows to call the clinic with any problems, questions or concerns. We can certainly see the patient much sooner if necessary      No orders of the defined types were placed in this encounter.   Amy Youngblood L Isola Mehlman, PA-C 10/07/21

## 2021-10-18 ENCOUNTER — Encounter (HOSPITAL_COMMUNITY): Payer: Self-pay

## 2021-10-18 ENCOUNTER — Other Ambulatory Visit: Payer: Self-pay

## 2021-10-18 ENCOUNTER — Inpatient Hospital Stay: Payer: Medicare HMO | Attending: Internal Medicine

## 2021-10-18 ENCOUNTER — Ambulatory Visit (HOSPITAL_COMMUNITY)
Admission: RE | Admit: 2021-10-18 | Discharge: 2021-10-18 | Disposition: A | Discharge status: Home / Self Care | Payer: Medicare HMO | Source: Ambulatory Visit | Attending: Internal Medicine | Admitting: Internal Medicine

## 2021-10-18 DIAGNOSIS — C3490 Malignant neoplasm of unspecified part of unspecified bronchus or lung: Secondary | ICD-10-CM | POA: Insufficient documentation

## 2021-10-18 DIAGNOSIS — Z902 Acquired absence of lung [part of]: Secondary | ICD-10-CM | POA: Insufficient documentation

## 2021-10-18 DIAGNOSIS — R1310 Dysphagia, unspecified: Secondary | ICD-10-CM | POA: Insufficient documentation

## 2021-10-18 DIAGNOSIS — R918 Other nonspecific abnormal finding of lung field: Secondary | ICD-10-CM | POA: Diagnosis not present

## 2021-10-18 DIAGNOSIS — I700 Atherosclerosis of aorta: Secondary | ICD-10-CM | POA: Diagnosis not present

## 2021-10-18 DIAGNOSIS — J439 Emphysema, unspecified: Secondary | ICD-10-CM | POA: Diagnosis not present

## 2021-10-18 DIAGNOSIS — R911 Solitary pulmonary nodule: Secondary | ICD-10-CM | POA: Diagnosis not present

## 2021-10-18 DIAGNOSIS — F17210 Nicotine dependence, cigarettes, uncomplicated: Secondary | ICD-10-CM | POA: Insufficient documentation

## 2021-10-18 LAB — CMP (CANCER CENTER ONLY)
ALT: 10 U/L (ref 0–44)
AST: 17 U/L (ref 15–41)
Albumin: 4 g/dL (ref 3.5–5.0)
Alkaline Phosphatase: 83 U/L (ref 38–126)
Anion gap: 5 (ref 5–15)
BUN: 21 mg/dL (ref 8–23)
CO2: 30 mmol/L (ref 22–32)
Calcium: 9.3 mg/dL (ref 8.9–10.3)
Chloride: 106 mmol/L (ref 98–111)
Creatinine: 1.18 mg/dL — ABNORMAL HIGH (ref 0.44–1.00)
GFR, Estimated: 48 mL/min — ABNORMAL LOW (ref >60)
Glucose, Bld: 97 mg/dL (ref 70–99)
Potassium: 4.4 mmol/L (ref 3.5–5.1)
Sodium: 141 mmol/L (ref 135–145)
Total Bilirubin: 0.5 mg/dL (ref 0.3–1.2)
Total Protein: 6.8 g/dL (ref 6.5–8.1)

## 2021-10-18 LAB — CBC WITH DIFFERENTIAL (CANCER CENTER ONLY)
Abs Immature Granulocytes: 0.03 10*3/uL (ref 0.00–0.07)
Basophils Absolute: 0.1 10*3/uL (ref 0.0–0.1)
Basophils Relative: 1 %
Eosinophils Absolute: 0.3 10*3/uL (ref 0.0–0.5)
Eosinophils Relative: 4 %
HCT: 39.2 % (ref 36.0–46.0)
Hemoglobin: 12.7 g/dL (ref 12.0–15.0)
Immature Granulocytes: 0 %
Lymphocytes Relative: 21 %
Lymphs Abs: 1.8 10*3/uL (ref 0.7–4.0)
MCH: 29.3 pg (ref 26.0–34.0)
MCHC: 32.4 g/dL (ref 30.0–36.0)
MCV: 90.5 fL (ref 80.0–100.0)
Monocytes Absolute: 0.7 10*3/uL (ref 0.1–1.0)
Monocytes Relative: 8 %
Neutro Abs: 5.7 10*3/uL (ref 1.7–7.7)
Neutrophils Relative %: 66 %
Platelet Count: 265 10*3/uL (ref 150–400)
RBC: 4.33 MIL/uL (ref 3.87–5.11)
RDW: 13.1 % (ref 11.5–15.5)
WBC Count: 8.7 10*3/uL (ref 4.0–10.5)
nRBC: 0 % (ref 0.0–0.2)

## 2021-10-18 MED ORDER — SODIUM CHLORIDE (PF) 0.9 % IJ SOLN
INTRAMUSCULAR | Status: AC
  Filled 2021-10-18: qty 50

## 2021-10-18 MED ORDER — IOHEXOL 350 MG/ML SOLN
60.0000 mL | Freq: Once | INTRAVENOUS | Status: AC | PRN
  Administered 2021-10-18: 60 mL via INTRAVENOUS

## 2021-10-20 ENCOUNTER — Other Ambulatory Visit: Payer: Self-pay

## 2021-10-20 ENCOUNTER — Inpatient Hospital Stay: Payer: Medicare HMO | Admitting: Physician Assistant

## 2021-10-20 VITALS — BP 157/58 | HR 92 | Temp 97.6°F | Resp 18 | Ht 64.0 in | Wt 190.3 lb

## 2021-10-20 DIAGNOSIS — Z902 Acquired absence of lung [part of]: Secondary | ICD-10-CM | POA: Diagnosis not present

## 2021-10-20 DIAGNOSIS — F17210 Nicotine dependence, cigarettes, uncomplicated: Secondary | ICD-10-CM | POA: Diagnosis not present

## 2021-10-20 DIAGNOSIS — R69 Illness, unspecified: Secondary | ICD-10-CM | POA: Diagnosis not present

## 2021-10-20 DIAGNOSIS — R1310 Dysphagia, unspecified: Secondary | ICD-10-CM | POA: Diagnosis not present

## 2021-10-20 DIAGNOSIS — C3431 Malignant neoplasm of lower lobe, right bronchus or lung: Secondary | ICD-10-CM

## 2021-10-20 DIAGNOSIS — C3490 Malignant neoplasm of unspecified part of unspecified bronchus or lung: Secondary | ICD-10-CM | POA: Diagnosis not present

## 2021-11-03 ENCOUNTER — Telehealth: Payer: Self-pay | Admitting: Internal Medicine

## 2021-11-03 NOTE — Telephone Encounter (Signed)
Sch per 1/4 los, pt aware

## 2021-12-14 DIAGNOSIS — Z23 Encounter for immunization: Secondary | ICD-10-CM | POA: Diagnosis not present

## 2021-12-14 DIAGNOSIS — L570 Actinic keratosis: Secondary | ICD-10-CM | POA: Diagnosis not present

## 2021-12-28 DIAGNOSIS — L570 Actinic keratosis: Secondary | ICD-10-CM | POA: Diagnosis not present

## 2022-01-03 DIAGNOSIS — Z0000 Encounter for general adult medical examination without abnormal findings: Secondary | ICD-10-CM | POA: Diagnosis not present

## 2022-01-03 DIAGNOSIS — E7800 Pure hypercholesterolemia, unspecified: Secondary | ICD-10-CM | POA: Diagnosis not present

## 2022-01-03 DIAGNOSIS — N183 Chronic kidney disease, stage 3 unspecified: Secondary | ICD-10-CM | POA: Diagnosis not present

## 2022-01-03 DIAGNOSIS — J449 Chronic obstructive pulmonary disease, unspecified: Secondary | ICD-10-CM | POA: Diagnosis not present

## 2022-01-03 DIAGNOSIS — M810 Age-related osteoporosis without current pathological fracture: Secondary | ICD-10-CM | POA: Diagnosis not present

## 2022-01-03 DIAGNOSIS — G4700 Insomnia, unspecified: Secondary | ICD-10-CM | POA: Diagnosis not present

## 2022-01-03 DIAGNOSIS — I10 Essential (primary) hypertension: Secondary | ICD-10-CM | POA: Diagnosis not present

## 2022-01-03 DIAGNOSIS — R69 Illness, unspecified: Secondary | ICD-10-CM | POA: Diagnosis not present

## 2022-01-03 DIAGNOSIS — J300 Vasomotor rhinitis: Secondary | ICD-10-CM | POA: Diagnosis not present

## 2022-01-03 DIAGNOSIS — K224 Dyskinesia of esophagus: Secondary | ICD-10-CM | POA: Diagnosis not present

## 2022-01-11 DIAGNOSIS — Z23 Encounter for immunization: Secondary | ICD-10-CM | POA: Diagnosis not present

## 2022-01-11 DIAGNOSIS — L570 Actinic keratosis: Secondary | ICD-10-CM | POA: Diagnosis not present

## 2022-01-11 DIAGNOSIS — L821 Other seborrheic keratosis: Secondary | ICD-10-CM | POA: Diagnosis not present

## 2022-01-11 DIAGNOSIS — L578 Other skin changes due to chronic exposure to nonionizing radiation: Secondary | ICD-10-CM | POA: Diagnosis not present

## 2022-01-11 DIAGNOSIS — L814 Other melanin hyperpigmentation: Secondary | ICD-10-CM | POA: Diagnosis not present

## 2022-04-17 ENCOUNTER — Other Ambulatory Visit: Payer: Self-pay

## 2022-04-17 ENCOUNTER — Inpatient Hospital Stay: Payer: Medicare HMO | Attending: Internal Medicine

## 2022-04-17 ENCOUNTER — Ambulatory Visit (HOSPITAL_COMMUNITY)
Admission: RE | Admit: 2022-04-17 | Discharge: 2022-04-17 | Disposition: A | Discharge status: Home / Self Care | Payer: Medicare HMO | Source: Ambulatory Visit | Attending: Physician Assistant | Admitting: Physician Assistant

## 2022-04-17 DIAGNOSIS — G8929 Other chronic pain: Secondary | ICD-10-CM | POA: Insufficient documentation

## 2022-04-17 DIAGNOSIS — C3431 Malignant neoplasm of lower lobe, right bronchus or lung: Secondary | ICD-10-CM | POA: Diagnosis not present

## 2022-04-17 DIAGNOSIS — I10 Essential (primary) hypertension: Secondary | ICD-10-CM | POA: Insufficient documentation

## 2022-04-17 DIAGNOSIS — Z79899 Other long term (current) drug therapy: Secondary | ICD-10-CM | POA: Insufficient documentation

## 2022-04-17 DIAGNOSIS — M549 Dorsalgia, unspecified: Secondary | ICD-10-CM | POA: Insufficient documentation

## 2022-04-17 DIAGNOSIS — C3490 Malignant neoplasm of unspecified part of unspecified bronchus or lung: Secondary | ICD-10-CM | POA: Diagnosis not present

## 2022-04-17 LAB — CBC WITH DIFFERENTIAL (CANCER CENTER ONLY)
Abs Immature Granulocytes: 0.02 10*3/uL (ref 0.00–0.07)
Basophils Absolute: 0 10*3/uL (ref 0.0–0.1)
Basophils Relative: 1 %
Eosinophils Absolute: 0.3 10*3/uL (ref 0.0–0.5)
Eosinophils Relative: 5 %
HCT: 35.9 % — ABNORMAL LOW (ref 36.0–46.0)
Hemoglobin: 11.5 g/dL — ABNORMAL LOW (ref 12.0–15.0)
Immature Granulocytes: 0 %
Lymphocytes Relative: 20 %
Lymphs Abs: 1.4 10*3/uL (ref 0.7–4.0)
MCH: 28 pg (ref 26.0–34.0)
MCHC: 32 g/dL (ref 30.0–36.0)
MCV: 87.6 fL (ref 80.0–100.0)
Monocytes Absolute: 0.6 10*3/uL (ref 0.1–1.0)
Monocytes Relative: 8 %
Neutro Abs: 4.6 10*3/uL (ref 1.7–7.7)
Neutrophils Relative %: 66 %
Platelet Count: 307 10*3/uL (ref 150–400)
RBC: 4.1 MIL/uL (ref 3.87–5.11)
RDW: 13.9 % (ref 11.5–15.5)
WBC Count: 7 10*3/uL (ref 4.0–10.5)
nRBC: 0 % (ref 0.0–0.2)

## 2022-04-17 LAB — CMP (CANCER CENTER ONLY)
ALT: 21 U/L (ref 0–44)
AST: 20 U/L (ref 15–41)
Albumin: 4.1 g/dL (ref 3.5–5.0)
Alkaline Phosphatase: 82 U/L (ref 38–126)
Anion gap: 3 — ABNORMAL LOW (ref 5–15)
BUN: 20 mg/dL (ref 8–23)
CO2: 29 mmol/L (ref 22–32)
Calcium: 9.5 mg/dL (ref 8.9–10.3)
Chloride: 109 mmol/L (ref 98–111)
Creatinine: 1.15 mg/dL — ABNORMAL HIGH (ref 0.44–1.00)
GFR, Estimated: 49 mL/min — ABNORMAL LOW (ref >60)
Glucose, Bld: 99 mg/dL (ref 70–99)
Potassium: 4.2 mmol/L (ref 3.5–5.1)
Sodium: 141 mmol/L (ref 135–145)
Total Bilirubin: 0.6 mg/dL (ref 0.3–1.2)
Total Protein: 6.9 g/dL (ref 6.5–8.1)

## 2022-04-17 MED ORDER — SODIUM CHLORIDE (PF) 0.9 % IJ SOLN
INTRAMUSCULAR | Status: AC
  Filled 2022-04-17: qty 50

## 2022-04-17 MED ORDER — IOHEXOL 300 MG/ML  SOLN
75.0000 mL | Freq: Once | INTRAMUSCULAR | Status: AC | PRN
  Administered 2022-04-17: 75 mL via INTRAVENOUS

## 2022-04-20 ENCOUNTER — Other Ambulatory Visit: Payer: Self-pay

## 2022-04-20 ENCOUNTER — Inpatient Hospital Stay: Payer: Medicare HMO | Admitting: Internal Medicine

## 2022-04-20 VITALS — BP 171/72 | HR 81 | Temp 97.0°F | Resp 17 | Wt 168.5 lb

## 2022-04-20 DIAGNOSIS — Z79899 Other long term (current) drug therapy: Secondary | ICD-10-CM | POA: Diagnosis not present

## 2022-04-20 DIAGNOSIS — G8929 Other chronic pain: Secondary | ICD-10-CM | POA: Diagnosis not present

## 2022-04-20 DIAGNOSIS — C3490 Malignant neoplasm of unspecified part of unspecified bronchus or lung: Secondary | ICD-10-CM

## 2022-04-20 DIAGNOSIS — M549 Dorsalgia, unspecified: Secondary | ICD-10-CM | POA: Diagnosis not present

## 2022-04-20 DIAGNOSIS — I10 Essential (primary) hypertension: Secondary | ICD-10-CM | POA: Diagnosis not present

## 2022-04-20 DIAGNOSIS — C3431 Malignant neoplasm of lower lobe, right bronchus or lung: Secondary | ICD-10-CM | POA: Diagnosis not present

## 2022-04-20 NOTE — Progress Notes (Signed)
[facility] Telephone:([phone]   Fax:([phone]  OFFICE PROGRESS NOTE  [name], MD [place] [address]  DIAGNOSIS: Stage IA (T1, N0, M0) non-small cell lung cancer, squamous cell carcinoma.  She presented with a right lower lobe lung nodule.  She was diagnosed in June 2021.    PRIOR THERAPY: Right lower lobe superior segmentectomy and lymph node dissection under the care of Dr. Roxan Hockey on 03/17/2020.   CURRENT THERAPY: Observation  INTERVAL HISTORY: Amy [name] 76 y.o. female returns to the clinic today for follow-up visit.  The patient is feeling fine today with no concerning complaints except for chronic back pain.  She also has some belching.  She denied having any chest pain, shortness of breath except with exertion with no cough or hemoptysis.  She has no fever or chills.  She has no nausea, vomiting, diarrhea or constipation.  She has no headache or visual changes.  The patient is here today for evaluation with repeat CT scan of the chest for restaging of her disease.  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  Status post right lower lobe superior segmentectomy    ALLERGIES:  has No Known Allergies.  MEDICATIONS:  Current Outpatient Medications  Medication Sig Dispense Refill   albuterol (VENTOLIN HFA) 108 (90 Base) MCG/ACT inhaler Inhale 1-2 puffs into the lungs every 6 (six) hours as needed for wheezing or shortness of breath.      [employer] TURMERIC CURCUMIN PO Take 450 mg by mouth daily.      Flaxseed, Linseed, (FLAX SEED OIL) 1300 MG CAPS See admin instructions.     ipratropium (ATROVENT) 0.06 % nasal spray 2 sprays in each nostril     magnesium oxide  (MAG-OX) 400 MG tablet Take 400 mg by mouth daily.     Omega 3-6-9 CAPS Take 2 capsules by mouth daily.     raloxifene (EVISTA) 60 MG tablet Take 60 mg by mouth daily.     sertraline (ZOLOFT) 50 MG tablet Take 50 mg by mouth daily.     simvastatin (ZOCOR) 40 MG tablet Take 40 mg by mouth every evening.      traMADol (ULTRAM) 50 MG tablet 1 tablet as needed for severe pain (mild pain)     traZODone (DESYREL) 50 MG tablet 1 tablet at bedtime as needed     No current facility-administered medications for this visit.    SURGICAL HISTORY:  Past Surgical History:  Procedure Laterality Date   APPENDECTOMY     EYE SURGERY     INTERCOSTAL NERVE BLOCK Right 03/17/2020   Procedure: Intercostal Nerve Block;  Surgeon: [name], MD;  Location: [facility];  Service: Thoracic;  Laterality: Right;   NODE DISSECTION Right 03/17/2020   Procedure: Node Dissection;  Surgeon: [name], MD;  Location[facility];  Service: Thoracic;  Laterality: Right;   TUBAL LIGATION      REVIEW OF SYSTEMS:  A comprehensive review of systems was negative except for: Respiratory: positive for dyspnea on exertion Musculoskeletal: positive for back pain   PHYSICAL EXAMINATION: General appearance: alert, cooperative, and no distress Head: Normocephalic, without obvious abnormality, atraumatic  Neck: no adenopathy, no JVD, supple, symmetrical, trachea midline, and thyroid not enlarged, symmetric, no tenderness/mass/nodules Lymph nodes: Cervical, supraclavicular, and axillary nodes normal. Resp: clear to auscultation bilaterally Back: symmetric, no curvature. ROM normal. No CVA tenderness. Cardio: regular rate and rhythm, S1, S2 normal, no murmur, click, rub or gallop GI: soft, non-tender; bowel sounds normal; no masses,  no organomegaly Extremities: extremities normal, atraumatic, no cyanosis or edema  ECOG PERFORMANCE STATUS: 1 - Symptomatic but completely ambulatory  Blood pressure (!) 171/72, pulse 81,  temperature (!) 97 F (36.1 C), temperature source Tympanic, resp. rate 17, weight 168 lb 8 oz (76.4 kg), SpO2 94 %.  LABORATORY DATA: Lab Results  Component Value Date   WBC 7.0 04/17/2022   HGB 11.5 (L) 04/17/2022   HCT 35.9 (L) 04/17/2022   MCV 87.6 04/17/2022   PLT 307 04/17/2022      Chemistry      Component Value Date/Time   NA 141 04/17/2022 0833   K 4.2 04/17/2022 0833   CL 109 04/17/2022 0833   CO2 29 04/17/2022 0833   BUN 20 04/17/2022 0833   CREATININE 1.15 (H) 04/17/2022 0833      Component Value Date/Time   CALCIUM 9.5 04/17/2022 0833   ALKPHOS 82 04/17/2022 0833   AST 20 04/17/2022 0833   ALT 21 04/17/2022 0833   BILITOT 0.6 04/17/2022 0833       RADIOGRAPHIC STUDIES: CT Chest W Contrast  Result Date: 04/17/2022 CLINICAL DATA:  Stage IA non-small cell right lower lobe lung cancer status post superior segmentectomy 03/17/2020. Restaging. * Tracking Code: BO * EXAM: CT CHEST WITH CONTRAST TECHNIQUE: Multidetector CT imaging of the chest was performed during intravenous contrast administration. RADIATION DOSE REDUCTION: This exam was performed according to the departmental dose-optimization program which includes automated exposure control, adjustment of the mA and/or kV according to patient size and/or use of iterative reconstruction technique. CONTRAST:  14mL OMNIPAQUE IOHEXOL 300 MG/ML  SOLN COMPARISON:  10/18/2021 chest CT. FINDINGS: Cardiovascular: Normal heart size. No significant pericardial effusion/thickening. Three-vessel coronary atherosclerosis. Atherosclerotic nonaneurysmal thoracic aorta. Normal caliber pulmonary arteries. No central pulmonary emboli. Mediastinum/Nodes: No discrete thyroid nodules. Mildly patulous upper thoracic esophagus with small air-fluid level. No axillary adenopathy. Newly mildly enlarged 1.0 cm short axis diameter lower right paratracheal node (series 2/image 65). Newly mildly enlarged 1.0 cm short axis diameter left paratracheal  node (series 2/image 55). No left hilar adenopathy. Mildly increased right infrahilar soft tissue encasing the bronchus intermedius measuring 3.3 x 1.9 cm (series 2/image 81), previously 2.9 x 1.7 cm. Lungs/Pleura: No pneumothorax. No pleural effusion. Moderate paraseptal and centrilobular emphysema with diffuse bronchial wall thickening. Status post superior segmentectomy in the right lower lobe with stable parenchymal bands scattered in the mid to lower right lung. No acute consolidative airspace disease. Irregular bandlike and nodular subpleural apical left upper lobe consolidation is unchanged and most compatible with postinfectious scarring. A few scattered tiny right upper lobe nodules measuring up to 0.2 cm (series 7/image 70) are all stable. No new significant pulmonary nodules. Upper abdomen: No acute abnormality. Musculoskeletal: No aggressive appearing focal osseous lesions. Moderate thoracic spondylosis. IMPRESSION: 1. Mildly increased right infrahilar soft tissue encasing the bronchus intermedius, cannot exclude local tumor recurrence. 2. New mild bilateral low paratracheal lymphadenopathy, nonspecific, cannot exclude nodal metastases. 3. Suggest further characterization with PET-CT at this time. 4. Three-vessel coronary atherosclerosis. 5. Mildly patulous upper thoracic esophagus with small air-fluid level, suggesting chronic esophageal dysmotility and/or gastroesophageal reflux. 6. Aortic Atherosclerosis (  ICD10-I70.0) and Emphysema (ICD10-J43.9). These results will be called to the ordering clinician or representative by the Radiologist Assistant, and communication documented in the PACS or [employer]. Electronically Signed   By: Ilona Sorrel M.D.   On: 04/17/2022 14:06    ASSESSMENT AND PLAN: This is a very pleasant 76 years old white female with history of stage IA (T1 a, N0, M0) non-small cell lung cancer, squamous cell carcinoma presented with right lower lobe lung nodule diagnosed in June  2021 status post right lower lobe superior segmentectomy with lymph node dissection under the care of Dr. Roxan Hockey on March 17, 2020. The patient is feeling fine today with no concerning complaints except for the back pain. She had repeat CT scan of the chest performed recently.  I personally and independently reviewed the scan images and discussed the results with the patient today. Her scan showed mild increase in the right infrahilar soft tissue encasing the bronchus intermedius and disease recurrence could not be completely excluded.  There was also new mild bilateral lower paratracheal lymphadenopathy that is nonspecific and nodal metastasis could not be completely excluded. I recommended for the patient to have repeat PET scan for further evaluation of this lesion and to rule out disease recurrence. I will see her back for follow-up visit in 2 weeks for evaluation and discussion of her PET scan results and further recommendation regarding her condition. For the hypertension she was advised to take her blood pressure medication and to monitor it closely at home. The patient was advised to call immediately if she has any other concerning symptoms in the interval. The patient voices understanding of current disease status and treatment options and is in agreement with the current care plan.  All questions were answered. The patient knows to call the clinic with any problems, questions or concerns. We can certainly see the patient much sooner if necessary.   Disclaimer: This note was dictated with voice recognition software. Similar sounding words can inadvertently be transcribed and may not be corrected upon review.

## 2022-04-27 ENCOUNTER — Ambulatory Visit (HOSPITAL_COMMUNITY)
Admission: RE | Admit: 2022-04-27 | Discharge: 2022-04-27 | Disposition: A | Discharge status: Home / Self Care | Payer: Medicare HMO | Source: Ambulatory Visit | Attending: Internal Medicine | Admitting: Internal Medicine

## 2022-04-27 DIAGNOSIS — C3490 Malignant neoplasm of unspecified part of unspecified bronchus or lung: Secondary | ICD-10-CM | POA: Insufficient documentation

## 2022-04-27 LAB — GLUCOSE, CAPILLARY: Glucose-Capillary: 68 mg/dL — ABNORMAL LOW (ref 70–99)

## 2022-04-27 MED ORDER — FLUDEOXYGLUCOSE F - 18 (FDG) INJECTION
8.4000 | Freq: Once | INTRAVENOUS | Status: AC
  Administered 2022-04-27: 8.4 via INTRAVENOUS

## 2022-05-03 ENCOUNTER — Other Ambulatory Visit: Payer: Self-pay

## 2022-05-03 ENCOUNTER — Inpatient Hospital Stay: Payer: Medicare HMO | Admitting: Internal Medicine

## 2022-05-03 VITALS — BP 147/64 | HR 76 | Temp 97.7°F | Resp 17 | Wt 170.1 lb

## 2022-05-03 DIAGNOSIS — Z79899 Other long term (current) drug therapy: Secondary | ICD-10-CM | POA: Diagnosis not present

## 2022-05-03 DIAGNOSIS — I10 Essential (primary) hypertension: Secondary | ICD-10-CM | POA: Diagnosis not present

## 2022-05-03 DIAGNOSIS — M549 Dorsalgia, unspecified: Secondary | ICD-10-CM | POA: Diagnosis not present

## 2022-05-03 DIAGNOSIS — C3431 Malignant neoplasm of lower lobe, right bronchus or lung: Secondary | ICD-10-CM | POA: Diagnosis not present

## 2022-05-03 DIAGNOSIS — G8929 Other chronic pain: Secondary | ICD-10-CM | POA: Diagnosis not present

## 2022-05-03 NOTE — Progress Notes (Signed)
[facility] [phone]) [phone]   [phone]  OFFICE PROGRESS NOTE  [name], MD [place] [address]  DIAGNOSIS: Likely recurrent lung cancer initially diagnosed as stage IA (T1, N0, M0) non-small cell lung cancer, squamous cell carcinoma.  She presented with a right lower lobe lung nodule.  She was diagnosed in June 2021.    PRIOR THERAPY: Right lower lobe superior segmentectomy and lymph node dissection under the care of Dr. Roxan Hockey on 03/17/2020.   CURRENT THERAPY: Observation  INTERVAL HISTORY: Amy [name] 76 y.o. female returns to the clinic today for follow-up visit accompanied by her husband.  The patient is feeling fine today with no concerning complaints except for wheezing.  She denied having any current chest pain, shortness of breath, cough or hemoptysis.  She denied having any fever or chills.  She has no nausea, vomiting, diarrhea or constipation.  She has no headache or visual changes.  She was found on recent CT scan of the chest to have suspicious disease recurrence in the right lung.  The patient had a PET scan performed recently and she is here for evaluation and discussion of her scan results and recommendation regarding her condition.  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  Status post right lower lobe superior segmentectomy    ALLERGIES:  has No Known Allergies.  MEDICATIONS:  Current Outpatient Medications  Medication Sig Dispense Refill   albuterol (VENTOLIN HFA) 108 (90 Base) MCG/ACT inhaler Inhale 1-2 puffs into the lungs every 6 (six) hours as needed for wheezing or shortness of breath.      [employer] TURMERIC CURCUMIN PO Take 450 mg by  mouth daily.      Flaxseed, Linseed, (FLAX SEED OIL) 1300 MG CAPS See admin instructions.     ipratropium (ATROVENT) 0.06 % nasal spray 2 sprays in each nostril     magnesium oxide (MAG-OX) 400 MG tablet Take 400 mg by mouth daily.     Omega 3-6-9 CAPS Take 2 capsules by mouth daily.     omeprazole (PRILOSEC) 40 MG capsule Take 40 mg by mouth daily.     raloxifene (EVISTA) 60 MG tablet Take 60 mg by mouth daily.     sertraline (ZOLOFT) 50 MG tablet Take 50 mg by mouth daily.     simvastatin (ZOCOR) 40 MG tablet Take 40 mg by mouth every evening.      traZODone (DESYREL) 50 MG tablet 1 tablet at bedtime as needed     No current facility-administered medications for this visit.    SURGICAL HISTORY:  Past Surgical History:  Procedure Laterality Date   APPENDECTOMY     EYE SURGERY     INTERCOSTAL NERVE BLOCK Right 03/17/2020   Procedure: Intercostal Nerve Block;  Surgeon: [name], MD;  Location: [facility];  Service: Thoracic;  Laterality: Right;   NODE DISSECTION Right 03/17/2020   Procedure: Node Dissection;  Surgeon: [name], MD;  Location: [facility] OR;  Service: Thoracic;  Laterality: Right;   TUBAL LIGATION      REVIEW OF SYSTEMS:  Constitutional: negative Eyes: negative Ears, nose, mouth, throat, and face: negative Respiratory: positive for  wheezing Cardiovascular: negative Gastrointestinal: negative Genitourinary:negative Integument/breast: negative Hematologic/lymphatic: negative Musculoskeletal:negative Neurological: negative Behavioral/Psych: negative Endocrine: negative Allergic/Immunologic: negative   PHYSICAL EXAMINATION: General appearance: alert, cooperative, and no distress Head: Normocephalic, without obvious abnormality, atraumatic Neck: no adenopathy, no JVD, supple, symmetrical, trachea midline, and thyroid not enlarged, symmetric, no tenderness/mass/nodules Lymph nodes: Cervical, supraclavicular, and axillary nodes normal. Resp: wheezes  RUL Back: symmetric, no curvature. ROM normal. No CVA tenderness. Cardio: regular rate and rhythm, S1, S2 normal, no murmur, click, rub or gallop GI: soft, non-tender; bowel sounds normal; no masses,  no organomegaly Extremities: extremities normal, atraumatic, no cyanosis or edema Neurologic: Alert and oriented X 3, normal strength and tone. Normal symmetric reflexes. Normal coordination and gait  ECOG PERFORMANCE STATUS: 1 - Symptomatic but completely ambulatory  Blood pressure (!) 147/64, pulse 76, temperature 97.7 F (36.5 C), temperature source Temporal, resp. rate 17, weight 170 lb 1 oz (77.1 kg), SpO2 97 %.  LABORATORY DATA: Lab Results  Component Value Date   WBC 7.0 04/17/2022   HGB 11.5 (L) 04/17/2022   HCT 35.9 (L) 04/17/2022   MCV 87.6 04/17/2022   PLT 307 04/17/2022      Chemistry      Component Value Date/Time   NA 141 04/17/2022 0833   K 4.2 04/17/2022 0833   CL 109 04/17/2022 0833   CO2 29 04/17/2022 0833   BUN 20 04/17/2022 0833   CREATININE 1.15 (H) 04/17/2022 0833      Component Value Date/Time   CALCIUM 9.5 04/17/2022 0833   ALKPHOS 82 04/17/2022 0833   AST 20 04/17/2022 0833   ALT 21 04/17/2022 0833   BILITOT 0.6 04/17/2022 0833       RADIOGRAPHIC STUDIES: NM PET Image Restage (PS) Skull Base to Thigh (F-18 FDG)  Result Date: 04/30/2022 CLINICAL DATA:  Subsequent treatment strategy for non-small cell lung cancer. Previous superior segmentectomy 03/17/2020. Increased right infrahilar soft tissue thickening and low right paratracheal adenopathy on recent CT. EXAM: NUCLEAR MEDICINE PET SKULL BASE TO THIGH TECHNIQUE: 8.4 mCi F-18 FDG was injected intravenously. Full-ring PET imaging was performed from the skull base to thigh after the radiotracer. CT data was obtained and used for attenuation correction and anatomic localization. Fasting blood glucose: 68 mg/dl COMPARISON:  Chest CT 04/17/2022 and 10/18/2021.  PET-CT 02/11/2020. FINDINGS: Mediastinal  blood pool activity: SUV max 2.9 NECK: No hypermetabolic cervical lymph nodes are identified.Minimally asymmetric uptake in the lymphoid tissue of Waldeyer's ring is within physiologic limits. No suspicious activity in the pharyngeal mucosal space. Incidental CT findings: Bilateral carotid atherosclerosis. CHEST: 9 mm left paratracheal node on image 63/4 is mildly hypermetabolic with an SUV max of 5.2. The low right paratracheal node measuring 9 mm on image 67/4 is not significantly hypermetabolic. However, there is a small node between the midesophagus and descending aorta which demonstrates mild hypermetabolic activity for size (SUV max 5.3). There is intense hypermetabolic activity within the enlarging right infrahilar soft tissue which measures up to 3.5 x 2.0 cm on image 77/4 and has an SUV max of 20.5. No peripheral hypermetabolic pulmonary activity or suspicious nodularity. Incidental CT findings: Otherwise stable postsurgical changes at the right lung base. Moderate centrilobular and paraseptal emphysema. Diffuse atherosclerosis of the aorta, great vessels and coronary arteries. ABDOMEN/PELVIS: There is no hypermetabolic activity within the liver, adrenal glands, spleen or pancreas. There is no hypermetabolic nodal activity. Mild metabolic activity at the gastroesophageal junction, suggesting reflux. Incidental CT findings: Aortic and branch vessel atherosclerosis without aneurysm. Diverticular changes  within the distal colon. SKELETON: Mild hypermetabolic activity within the superior endplate of L1 (SUV max 5.9). Mild associated endplate depression, favoring a mild benign compression deformity. No osseous uptake suspicious for metastatic disease. Incidental CT findings: none IMPRESSION: 1. Intensely hypermetabolic recurrence in the right infrahilar region. 2. Mildly prominent left paratracheal and paraesophageal lymph nodes are also mildly hypermetabolic for size, suspicious for nodal metastases. 3. No  distant metastases identified. 4. Uptake within the superior endplate of the L1 vertebral body, favoring to reflect a mild benign compression deformity. Electronically Signed   By: Richardean Sale M.D.   On: 04/30/2022 14:55   CT Chest W Contrast  Result Date: 04/17/2022 CLINICAL DATA:  Stage IA non-small cell right lower lobe lung cancer status post superior segmentectomy 03/17/2020. Restaging. * Tracking Code: BO * EXAM: CT CHEST WITH CONTRAST TECHNIQUE: Multidetector CT imaging of the chest was performed during intravenous contrast administration. RADIATION DOSE REDUCTION: This exam was performed according to the departmental dose-optimization program which includes automated exposure control, adjustment of the mA and/or kV according to patient size and/or use of iterative reconstruction technique. CONTRAST:  34mL OMNIPAQUE IOHEXOL 300 MG/ML  SOLN COMPARISON:  10/18/2021 chest CT. FINDINGS: Cardiovascular: Normal heart size. No significant pericardial effusion/thickening. Three-vessel coronary atherosclerosis. Atherosclerotic nonaneurysmal thoracic aorta. Normal caliber pulmonary arteries. No central pulmonary emboli. Mediastinum/Nodes: No discrete thyroid nodules. Mildly patulous upper thoracic esophagus with small air-fluid level. No axillary adenopathy. Newly mildly enlarged 1.0 cm short axis diameter lower right paratracheal node (series 2/image 65). Newly mildly enlarged 1.0 cm short axis diameter left paratracheal node (series 2/image 55). No left hilar adenopathy. Mildly increased right infrahilar soft tissue encasing the bronchus intermedius measuring 3.3 x 1.9 cm (series 2/image 81), previously 2.9 x 1.7 cm. Lungs/Pleura: No pneumothorax. No pleural effusion. Moderate paraseptal and centrilobular emphysema with diffuse bronchial wall thickening. Status post superior segmentectomy in the right lower lobe with stable parenchymal bands scattered in the mid to lower right lung. No acute consolidative  airspace disease. Irregular bandlike and nodular subpleural apical left upper lobe consolidation is unchanged and most compatible with postinfectious scarring. A few scattered tiny right upper lobe nodules measuring up to 0.2 cm (series 7/image 70) are all stable. No new significant pulmonary nodules. Upper abdomen: No acute abnormality. Musculoskeletal: No aggressive appearing focal osseous lesions. Moderate thoracic spondylosis. IMPRESSION: 1. Mildly increased right infrahilar soft tissue encasing the bronchus intermedius, cannot exclude local tumor recurrence. 2. New mild bilateral low paratracheal lymphadenopathy, nonspecific, cannot exclude nodal metastases. 3. Suggest further characterization with PET-CT at this time. 4. Three-vessel coronary atherosclerosis. 5. Mildly patulous upper thoracic esophagus with small air-fluid level, suggesting chronic esophageal dysmotility and/or gastroesophageal reflux. 6. Aortic Atherosclerosis (ICD10-I70.0) and Emphysema (ICD10-J43.9). These results will be called to the ordering clinician or representative by the Radiologist Assistant, and communication documented in the PACS or [employer]. Electronically Signed   By: Ilona Sorrel M.D.   On: 04/17/2022 14:06    ASSESSMENT AND PLAN: This is a very pleasant 76 years old white female with suspicious recurrent non-small cell lung cancer initially diagnosed as history of stage IA (T1 a, N0, M0) non-small cell lung cancer, squamous cell carcinoma presented with right lower lobe lung nodule diagnosed in June 2021 status post right lower lobe superior segmentectomy with lymph node dissection under the care of Dr. Roxan Hockey on March 17, 2020.  The patient has evidence for disease recurrence in July 2023. The patient is feeling fine today with no concerning complaints  except for the back pain. She had repeat CT scan of the chest performed recently.  I personally and independently reviewed the scan images and discussed the  results with the patient today. Her scan showed mild increase in the right infrahilar soft tissue encasing the bronchus intermedius and disease recurrence could not be completely excluded.  There was also new mild bilateral lower paratracheal lymphadenopathy that is nonspecific and nodal metastasis could not be completely excluded. The patient had a PET scan performed recently and that showed intensely hypermetabolic recurrence in the right infrahilar region with mildly prominent left paratracheal and paraesophageal lymph nodes also mildly hypermetabolic for size and suspicious for nodal metastasis but no distant metastatic disease. I personally and independently reviewed the scan images and discussed the result and showed the images to the patient and her husband. I recommended for her to see Dr. Roxan Hockey for consideration of bronchoscopy and biopsy of the disease recurrence in the right lung for confirmation of the tissue diagnosis. I will see the patient back for follow-up visit in 2-3 weeks for evaluation and detailed discussion of her treatment options which likely to be a course of concurrent chemoradiation with weekly carboplatin and paclitaxel. The patient and her husband agreed to the current plan. She was advised to call immediately if she has any other concerning issues in the interval. For the hypertension she was advised to take her blood pressure medication and to monitor it closely at home.  The patient voices understanding of current disease status and treatment options and is in agreement with the current care plan.  All questions were answered. The patient knows to call the clinic with any problems, questions or concerns. We can certainly see the patient much sooner if necessary. The total time spent in the appointment was 30 minutes.   Disclaimer: This note was dictated with voice recognition software. Similar sounding words can inadvertently be transcribed and may not be  corrected upon review.

## 2022-05-05 ENCOUNTER — Telehealth: Payer: Self-pay | Admitting: Internal Medicine

## 2022-05-05 NOTE — Telephone Encounter (Signed)
Scheduled per los, patient has been called and notified of upcoming appointments.

## 2022-05-17 ENCOUNTER — Encounter: Payer: Medicare HMO | Admitting: Thoracic Surgery (Cardiothoracic Vascular Surgery)

## 2022-05-19 ENCOUNTER — Other Ambulatory Visit: Payer: Self-pay | Admitting: *Deleted

## 2022-05-19 ENCOUNTER — Institutional Professional Consult (permissible substitution): Payer: Medicare HMO | Admitting: Thoracic Surgery (Cardiothoracic Vascular Surgery)

## 2022-05-19 ENCOUNTER — Encounter: Payer: Self-pay | Admitting: *Deleted

## 2022-05-19 VITALS — BP 140/79 | HR 68 | Resp 20 | Ht 64.0 in | Wt 170.0 lb

## 2022-05-19 DIAGNOSIS — C3431 Malignant neoplasm of lower lobe, right bronchus or lung: Secondary | ICD-10-CM

## 2022-05-19 DIAGNOSIS — R918 Other nonspecific abnormal finding of lung field: Secondary | ICD-10-CM

## 2022-05-19 NOTE — H&P (View-Only) (Signed)
[address]       [place] [address]             [phone]     HPI: Amy [name] returns for evaluation of her right hilar mass.  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  She had a right lower lobe superior segmentectomy and node dissection in June 2021.  She was not a candidate for lobectomy as her preoperative FEV1 was 1.02.  She had issues with coughing and belching ever since the surgery.  That has not changed.  She recently saw Dr. Julien Nordmann.  His CT showed an increase in soft tissue in the right hilum.  A PET/CT showed that area was hypermetabolic.  She otherwise feels well.  She has stopped smoking.  Past Medical History:  Diagnosis Date   Anxiety    COPD (chronic obstructive pulmonary disease) ([place]) 2021   Depression 01/28/2020   Hyperlipidemia    Hypertension    Pneumonia    Squamous cell carcinoma of bronchus in right lower lobe ([place]) 04/06/2020   T1, N0, stage Ia.  Status post right lower lobe superior segmentectomy    Current Outpatient Medications  Medication Sig Dispense Refill   albuterol (VENTOLIN HFA) 108 (90 Base) MCG/ACT inhaler Inhale 1-2 puffs into the lungs every 6 (six) hours as needed for wheezing or shortness of breath.      [employer]S TURMERIC CURCUMIN PO Take 450 mg by mouth daily.      Flaxseed, Linseed, (FLAX SEED OIL) 1300 MG CAPS See admin instructions.     ipratropium (ATROVENT) 0.06 % nasal spray 2 sprays in each nostril     magnesium oxide (MAG-OX) 400 MG tablet Take 400 mg by mouth daily.     Omega 3-6-9 CAPS Take 2 capsules by mouth daily.     omeprazole (PRILOSEC) 40 MG capsule Take 40 mg by mouth daily.     raloxifene (EVISTA) 60 MG tablet Take 60 mg by mouth daily.     sertraline (ZOLOFT) 50 MG tablet Take 50 mg by mouth daily.     simvastatin  (ZOCOR) 40 MG tablet Take 40 mg by mouth every evening.      traZODone (DESYREL) 50 MG tablet 1 tablet at bedtime as needed     No current facility-administered medications for this visit.    Physical Exam BP (!) 140/79   Pulse 68   Resp 20   Ht 5\' 4"  (1.626 m)   Wt 170 lb (77.1 kg)   SpO2 93% Comment: RA  BMI 29.11 kg/m  76 year old woman in no acute distress Alert and oriented x3 with no focal deficits No cervical or supraclavicular adenopathy Cardiac regular rate and rhythm Lungs diminished breath sounds bilaterally no wheezing No peripheral edema  Diagnostic Tests: NUCLEAR MEDICINE PET SKULL BASE TO THIGH   TECHNIQUE: 8.4 mCi F-18 FDG was injected intravenously. Full-ring PET imaging was performed from the skull base to thigh after the radiotracer. CT data was obtained and used for attenuation correction and anatomic localization.   Fasting blood glucose: 68 mg/dl   COMPARISON:  Chest CT 04/17/2022 and 10/18/2021.  PET-CT 02/11/2020.   FINDINGS: Mediastinal blood pool activity: SUV max 2.9   NECK:   No hypermetabolic cervical lymph nodes are identified.Minimally asymmetric uptake in the lymphoid tissue of Waldeyer's ring is  within physiologic limits. No suspicious activity in the pharyngeal mucosal space.   Incidental CT findings: Bilateral carotid atherosclerosis.   CHEST:   9 mm left paratracheal node on image 63/4 is mildly hypermetabolic with an SUV max of 5.2. The low right paratracheal node measuring 9 mm on image 67/4 is not significantly hypermetabolic. However, there is a small node between the midesophagus and descending aorta which demonstrates mild hypermetabolic activity for size (SUV max 5.3). There is intense hypermetabolic activity within the enlarging right infrahilar soft tissue which measures up to 3.5 x 2.0 cm on image 77/4 and has an SUV max of 20.5. No peripheral hypermetabolic pulmonary activity or suspicious nodularity.    Incidental CT findings: Otherwise stable postsurgical changes at the right lung base. Moderate centrilobular and paraseptal emphysema. Diffuse atherosclerosis of the aorta, great vessels and coronary arteries.   ABDOMEN/PELVIS:   There is no hypermetabolic activity within the liver, adrenal glands, spleen or pancreas. There is no hypermetabolic nodal activity. Mild metabolic activity at the gastroesophageal junction, suggesting reflux.   Incidental CT findings: Aortic and branch vessel atherosclerosis without aneurysm. Diverticular changes within the distal colon.   SKELETON:   Mild hypermetabolic activity within the superior endplate of L1 (SUV max 5.9). Mild associated endplate depression, favoring a mild benign compression deformity. No osseous uptake suspicious for metastatic disease.   Incidental CT findings: none   IMPRESSION: 1. Intensely hypermetabolic recurrence in the right infrahilar region. 2. Mildly prominent left paratracheal and paraesophageal lymph nodes are also mildly hypermetabolic for size, suspicious for nodal metastases. 3. No distant metastases identified. 4. Uptake within the superior endplate of the L1 vertebral body, favoring to reflect a mild benign compression deformity.     Electronically Signed   By: Richardean Sale M.D.   On: 04/30/2022 14:55 I personally reviewed the CT and PET/CT images.  There is hypermetabolic soft tissue in the right infrahilar region.  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  She had a right lower lobe superior segmentectomy and node dissection in June 2021.   She now has progressive soft tissue density in the right hilum which is markedly hypermetabolic on PET/CT.  Findings are suspicious for recurrence.  New primary is also in  the differential diagnosis.  Infectious and inflammatory etiologies are far less likely.  She needs a tissue diagnosis to guide therapy.  I recommended to Amy [name] and her husband that we do bronchoscopy and endobronchial ultrasound for diagnostic purposes.  I informed him of the general nature of the procedure.  They understand it is an endoscopic procedure that we will plan to do on an outpatient basis.  I informed them of the indications, risks, benefits, and alternatives.  They understand the risks include those associated with general anesthesia such as MI or stroke, as well as bleeding, pneumothorax, and other unforeseeable complications.  She accepts the risk and agrees to proceed.  Plan: Bronchoscopy and endobronchial ultrasound on Monday, 05/22/2022 she will need preop on the day of procedure.  [name], MD [facility] [phone]

## 2022-05-19 NOTE — Progress Notes (Signed)
[address]       [place] [address]             [phone]     HPI: Amy [name] returns for evaluation of her right hilar mass.  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  She had a right lower lobe superior segmentectomy and node dissection in June 2021.  She was not a candidate for lobectomy as her preoperative FEV1 was 1.02.  She had issues with coughing and belching ever since the surgery.  That has not changed.  She recently saw Dr. Julien Nordmann.  His CT showed an increase in soft tissue in the right hilum.  A PET/CT showed that area was hypermetabolic.  She otherwise feels well.  She has stopped smoking.  Past Medical History:  Diagnosis Date   Anxiety    COPD (chronic obstructive pulmonary disease) ([place]) 2021   Depression 01/28/2020   Hyperlipidemia    Hypertension    Pneumonia    Squamous cell carcinoma of bronchus in right lower lobe ([place]) 04/06/2020   T1, N0, stage Ia.  Status post right lower lobe superior segmentectomy    Current Outpatient Medications  Medication Sig Dispense Refill   albuterol (VENTOLIN HFA) 108 (90 Base) MCG/ACT inhaler Inhale 1-2 puffs into the lungs every 6 (six) hours as needed for wheezing or shortness of breath.      [employer] TURMERIC CURCUMIN PO Take 450 mg by mouth daily.      Flaxseed, Linseed, (FLAX SEED OIL) 1300 MG CAPS See admin instructions.     ipratropium (ATROVENT) 0.06 % nasal spray 2 sprays in each nostril     magnesium oxide (MAG-OX) 400 MG tablet Take 400 mg by mouth daily.     Omega 3-6-9 CAPS Take 2 capsules by mouth daily.     omeprazole (PRILOSEC) 40 MG capsule Take 40 mg by mouth daily.     raloxifene (EVISTA) 60 MG tablet Take 60 mg by mouth daily.     sertraline (ZOLOFT) 50 MG tablet Take 50 mg by mouth daily.     simvastatin  (ZOCOR) 40 MG tablet Take 40 mg by mouth every evening.      traZODone (DESYREL) 50 MG tablet 1 tablet at bedtime as needed     No current facility-administered medications for this visit.    Physical Exam BP (!) 140/79   Pulse 68   Resp 20   Ht 5\' 4"  (1.626 m)   Wt 170 lb (77.1 kg)   SpO2 93% Comment: RA  BMI 29.63 kg/m  76 year old woman in no acute distress Alert and oriented x3 with no focal deficits No cervical or supraclavicular adenopathy Cardiac regular rate and rhythm Lungs diminished breath sounds bilaterally no wheezing No peripheral edema  Diagnostic Tests: NUCLEAR MEDICINE PET SKULL BASE TO THIGH   TECHNIQUE: 8.4 mCi F-18 FDG was injected intravenously. Full-ring PET imaging was performed from the skull base to thigh after the radiotracer. CT data was obtained and used for attenuation correction and anatomic localization.   Fasting blood glucose: 68 mg/dl   COMPARISON:  Chest CT 04/17/2022 and 10/18/2021.  PET-CT 02/11/2020.   FINDINGS: Mediastinal blood pool activity: SUV max 2.9   NECK:   No hypermetabolic cervical lymph nodes are identified.Minimally asymmetric uptake in the lymphoid tissue of Waldeyer's ring is  within physiologic limits. No suspicious activity in the pharyngeal mucosal space.   Incidental CT findings: Bilateral carotid atherosclerosis.   CHEST:   9 mm left paratracheal node on image 63/4 is mildly hypermetabolic with an SUV max of 5.2. The low right paratracheal node measuring 9 mm on image 67/4 is not significantly hypermetabolic. However, there is a small node between the midesophagus and descending aorta which demonstrates mild hypermetabolic activity for size (SUV max 5.3). There is intense hypermetabolic activity within the enlarging right infrahilar soft tissue which measures up to 3.5 x 2.0 cm on image 77/4 and has an SUV max of 20.5. No peripheral hypermetabolic pulmonary activity or suspicious nodularity.    Incidental CT findings: Otherwise stable postsurgical changes at the right lung base. Moderate centrilobular and paraseptal emphysema. Diffuse atherosclerosis of the aorta, great vessels and coronary arteries.   ABDOMEN/PELVIS:   There is no hypermetabolic activity within the liver, adrenal glands, spleen or pancreas. There is no hypermetabolic nodal activity. Mild metabolic activity at the gastroesophageal junction, suggesting reflux.   Incidental CT findings: Aortic and branch vessel atherosclerosis without aneurysm. Diverticular changes within the distal colon.   SKELETON:   Mild hypermetabolic activity within the superior endplate of L1 (SUV max 5.9). Mild associated endplate depression, favoring a mild benign compression deformity. No osseous uptake suspicious for metastatic disease.   Incidental CT findings: none   IMPRESSION: 1. Intensely hypermetabolic recurrence in the right infrahilar region. 2. Mildly prominent left paratracheal and paraesophageal lymph nodes are also mildly hypermetabolic for size, suspicious for nodal metastases. 3. No distant metastases identified. 4. Uptake within the superior endplate of the L1 vertebral body, favoring to reflect a mild benign compression deformity.     Electronically Signed   By: Amy [name] M.D.   On: 04/30/2022 14:55 I personally reviewed the CT and PET/CT images.  There is hypermetabolic soft tissue in the right infrahilar region.  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  She had a right lower lobe superior segmentectomy and node dissection in June 2021.   She now has progressive soft tissue density in the right hilum which is markedly hypermetabolic on PET/CT.  Findings are suspicious for recurrence.  New primary is also in  the differential diagnosis.  Infectious and inflammatory etiologies are far less likely.  She needs a tissue diagnosis to guide therapy.  I recommended to Amy [name] and her husband that we do bronchoscopy and endobronchial ultrasound for diagnostic purposes.  I informed him of the general nature of the procedure.  They understand it is an endoscopic procedure that we will plan to do on an outpatient basis.  I informed them of the indications, risks, benefits, and alternatives.  They understand the risks include those associated with general anesthesia such as MI or stroke, as well as bleeding, pneumothorax, and other unforeseeable complications.  She accepts the risk and agrees to proceed.  Plan: Bronchoscopy and endobronchial ultrasound on Monday, 05/22/2022 she will need preop on the day of procedure.  [name], MD [facility] [phone]

## 2022-05-19 NOTE — Progress Notes (Signed)
  Anesthesia review: n/a  -------------  SDW INSTRUCTIONS:  Your procedure is scheduled on 8/7. Please report to [facility] Main Entrance "A" at 1015 A.M., and check in at the Admitting office. Call this number if you have problems the morning of surgery: [phone]   Remember: Do not eat or drink after midnight the night before your surgery   Medications to take morning of surgery with a sip of water include: Please bring all inhalers with you the day of surgery.   omeprazole (PRILOSEC) sertraline (ZOLOFT)    As of today, STOP taking any Aspirin (unless otherwise instructed by your surgeon), Aleve, Naproxen, Ibuprofen, Motrin, Advil, Goody's, BC's, all herbal medications, fish oil, and all vitamins.    The Morning of Surgery Do not wear jewelry, make-up or nail polish. Do not wear lotions, powders, or perfumes or deodorant Do not bring valuables to the hospital. [facility] is not responsible for any belongings or valuables.  If you are a smoker, DO NOT Smoke 24 hours prior to surgery  If you wear a CPAP at night please bring your mask the morning of surgery   Remember that you must have someone to transport you home after your surgery, and remain with you for 24 hours if you are discharged the same day.  Please bring cases for contacts, glasses, hearing aids, dentures or bridgework because it cannot be worn into surgery.   Patients discharged the day of surgery will not be allowed to drive home.   Please shower the NIGHT BEFORE/MORNING OF SURGERY (use antibacterial soap like DIAL soap if possible). Wear comfortable clothes the morning of surgery. Oral Hygiene is also important to reduce your risk of infection.  Remember - BRUSH YOUR TEETH THE MORNING OF SURGERY WITH YOUR REGULAR TOOTHPASTE  Patient denies shortness of breath, fever, cough and chest pain.

## 2022-05-20 ENCOUNTER — Encounter (HOSPITAL_COMMUNITY): Payer: Self-pay | Admitting: Thoracic Surgery (Cardiothoracic Vascular Surgery)

## 2022-05-20 NOTE — Progress Notes (Signed)
Gave pre-op instructions to pt.  PCP: [name] @ [facility] Cardiologist: na    Oncology: Julien Nordmann

## 2022-05-22 ENCOUNTER — Ambulatory Visit (HOSPITAL_BASED_OUTPATIENT_CLINIC_OR_DEPARTMENT_OTHER): Payer: Medicare HMO | Admitting: Certified Registered"

## 2022-05-22 ENCOUNTER — Encounter (HOSPITAL_COMMUNITY): Payer: Self-pay | Admitting: Thoracic Surgery (Cardiothoracic Vascular Surgery)

## 2022-05-22 ENCOUNTER — Encounter (HOSPITAL_COMMUNITY)
Admission: RE | Disposition: A | Discharge status: Home / Self Care | Payer: Self-pay | Source: Home / Self Care | Attending: Thoracic Surgery (Cardiothoracic Vascular Surgery)

## 2022-05-22 ENCOUNTER — Other Ambulatory Visit: Payer: Self-pay

## 2022-05-22 ENCOUNTER — Ambulatory Visit (HOSPITAL_COMMUNITY): Payer: Medicare HMO | Admitting: Certified Registered"

## 2022-05-22 ENCOUNTER — Ambulatory Visit (HOSPITAL_COMMUNITY)
Admission: RE | Admit: 2022-05-22 | Discharge: 2022-05-22 | Disposition: A | Discharge status: Home / Self Care | Payer: Medicare HMO | Attending: Thoracic Surgery (Cardiothoracic Vascular Surgery) | Admitting: Thoracic Surgery (Cardiothoracic Vascular Surgery)

## 2022-05-22 ENCOUNTER — Ambulatory Visit (HOSPITAL_COMMUNITY): Payer: Medicare HMO

## 2022-05-22 DIAGNOSIS — Z902 Acquired absence of lung [part of]: Secondary | ICD-10-CM | POA: Diagnosis not present

## 2022-05-22 DIAGNOSIS — F418 Other specified anxiety disorders: Secondary | ICD-10-CM

## 2022-05-22 DIAGNOSIS — Z20822 Contact with and (suspected) exposure to covid-19: Secondary | ICD-10-CM | POA: Diagnosis not present

## 2022-05-22 DIAGNOSIS — C3491 Malignant neoplasm of unspecified part of right bronchus or lung: Secondary | ICD-10-CM

## 2022-05-22 DIAGNOSIS — I10 Essential (primary) hypertension: Secondary | ICD-10-CM | POA: Diagnosis not present

## 2022-05-22 DIAGNOSIS — I2510 Atherosclerotic heart disease of native coronary artery without angina pectoris: Secondary | ICD-10-CM | POA: Diagnosis not present

## 2022-05-22 DIAGNOSIS — R69 Illness, unspecified: Secondary | ICD-10-CM | POA: Diagnosis not present

## 2022-05-22 DIAGNOSIS — C3401 Malignant neoplasm of right main bronchus: Secondary | ICD-10-CM | POA: Insufficient documentation

## 2022-05-22 DIAGNOSIS — C779 Secondary and unspecified malignant neoplasm of lymph node, unspecified: Secondary | ICD-10-CM | POA: Diagnosis not present

## 2022-05-22 DIAGNOSIS — I129 Hypertensive chronic kidney disease with stage 1 through stage 4 chronic kidney disease, or unspecified chronic kidney disease: Secondary | ICD-10-CM | POA: Insufficient documentation

## 2022-05-22 DIAGNOSIS — C771 Secondary and unspecified malignant neoplasm of intrathoracic lymph nodes: Secondary | ICD-10-CM | POA: Insufficient documentation

## 2022-05-22 DIAGNOSIS — N183 Chronic kidney disease, stage 3 unspecified: Secondary | ICD-10-CM | POA: Insufficient documentation

## 2022-05-22 DIAGNOSIS — Z87891 Personal history of nicotine dependence: Secondary | ICD-10-CM | POA: Diagnosis not present

## 2022-05-22 DIAGNOSIS — I6523 Occlusion and stenosis of bilateral carotid arteries: Secondary | ICD-10-CM | POA: Diagnosis not present

## 2022-05-22 DIAGNOSIS — E785 Hyperlipidemia, unspecified: Secondary | ICD-10-CM | POA: Diagnosis not present

## 2022-05-22 DIAGNOSIS — J449 Chronic obstructive pulmonary disease, unspecified: Secondary | ICD-10-CM | POA: Diagnosis not present

## 2022-05-22 DIAGNOSIS — R222 Localized swelling, mass and lump, trunk: Secondary | ICD-10-CM | POA: Diagnosis not present

## 2022-05-22 DIAGNOSIS — F32A Depression, unspecified: Secondary | ICD-10-CM | POA: Insufficient documentation

## 2022-05-22 DIAGNOSIS — F419 Anxiety disorder, unspecified: Secondary | ICD-10-CM | POA: Diagnosis not present

## 2022-05-22 DIAGNOSIS — C3431 Malignant neoplasm of lower lobe, right bronchus or lung: Secondary | ICD-10-CM | POA: Diagnosis not present

## 2022-05-22 DIAGNOSIS — R918 Other nonspecific abnormal finding of lung field: Secondary | ICD-10-CM

## 2022-05-22 DIAGNOSIS — Z01818 Encounter for other preprocedural examination: Secondary | ICD-10-CM | POA: Diagnosis not present

## 2022-05-22 HISTORY — DX: Unspecified osteoarthritis, unspecified site: M19.90

## 2022-05-22 HISTORY — DX: Personal history of other diseases of the digestive system: Z87.19

## 2022-05-22 HISTORY — DX: Chronic kidney disease, unspecified: N18.9

## 2022-05-22 HISTORY — DX: Dyspnea, unspecified: R06.00

## 2022-05-22 HISTORY — PX: VIDEO BRONCHOSCOPY WITH ENDOBRONCHIAL ULTRASOUND: SHX6177

## 2022-05-22 LAB — CBC
HCT: 38.3 % (ref 36.0–46.0)
Hemoglobin: 12.3 g/dL (ref 12.0–15.0)
MCH: 27.6 pg (ref 26.0–34.0)
MCHC: 32.1 g/dL (ref 30.0–36.0)
MCV: 85.9 fL (ref 80.0–100.0)
Platelets: 256 10*3/uL (ref 150–400)
RBC: 4.46 MIL/uL (ref 3.87–5.11)
RDW: 15.2 % (ref 11.5–15.5)
WBC: 8 10*3/uL (ref 4.0–10.5)
nRBC: 0 % (ref 0.0–0.2)

## 2022-05-22 LAB — COMPREHENSIVE METABOLIC PANEL
ALT: 12 U/L (ref 0–44)
AST: 22 U/L (ref 15–41)
Albumin: 3.7 g/dL (ref 3.5–5.0)
Alkaline Phosphatase: 78 U/L (ref 38–126)
Anion gap: 8 (ref 5–15)
BUN: 21 mg/dL (ref 8–23)
CO2: 27 mmol/L (ref 22–32)
Calcium: 9.6 mg/dL (ref 8.9–10.3)
Chloride: 105 mmol/L (ref 98–111)
Creatinine, Ser: 1.25 mg/dL — ABNORMAL HIGH (ref 0.44–1.00)
GFR, Estimated: 44 mL/min — ABNORMAL LOW (ref >60)
Glucose, Bld: 102 mg/dL — ABNORMAL HIGH (ref 70–99)
Potassium: 4.6 mmol/L (ref 3.5–5.1)
Sodium: 140 mmol/L (ref 135–145)
Total Bilirubin: 0.9 mg/dL (ref 0.3–1.2)
Total Protein: 6.6 g/dL (ref 6.5–8.1)

## 2022-05-22 LAB — PROTIME-INR
INR: 1 (ref 0.8–1.2)
Prothrombin Time: 12.6 seconds (ref 11.4–15.2)

## 2022-05-22 LAB — SARS CORONAVIRUS 2 BY RT PCR: SARS Coronavirus 2 by RT PCR: NEGATIVE

## 2022-05-22 LAB — APTT: aPTT: 24 seconds (ref 24–36)

## 2022-05-22 SURGERY — BRONCHOSCOPY, WITH EBUS
Anesthesia: General | Site: Bronchus

## 2022-05-22 MED ORDER — MEPERIDINE HCL 25 MG/ML IJ SOLN: 6.2500 mg | INTRAMUSCULAR | Status: DC | PRN

## 2022-05-22 MED ORDER — ACETAMINOPHEN 500 MG PO TABS
ORAL_TABLET | ORAL | Status: DC
Start: 2022-05-22 — End: 2022-05-22
  Filled 2022-05-22: qty 2

## 2022-05-22 MED ORDER — PROPOFOL 10 MG/ML IV BOLUS
INTRAVENOUS | Status: AC
  Filled 2022-05-22: qty 20

## 2022-05-22 MED ORDER — FENTANYL CITRATE (PF) 250 MCG/5ML IJ SOLN
INTRAMUSCULAR | Status: AC
  Filled 2022-05-22: qty 5

## 2022-05-22 MED ORDER — MIDAZOLAM HCL 2 MG/2ML IJ SOLN: 0.5000 mg | Freq: Once | INTRAMUSCULAR | Status: DC | PRN

## 2022-05-22 MED ORDER — DEXAMETHASONE SODIUM PHOSPHATE 10 MG/ML IJ SOLN
INTRAMUSCULAR | Status: AC
  Filled 2022-05-22: qty 1

## 2022-05-22 MED ORDER — LIDOCAINE 2% (20 MG/ML) 5 ML SYRINGE
INTRAMUSCULAR | Status: AC
  Filled 2022-05-22: qty 5

## 2022-05-22 MED ORDER — ONDANSETRON HCL 4 MG/2ML IJ SOLN
INTRAMUSCULAR | Status: DC | PRN
  Administered 2022-05-22: 4 mg via INTRAVENOUS

## 2022-05-22 MED ORDER — ACETAMINOPHEN 500 MG PO TABS
1000.0000 mg | ORAL_TABLET | Freq: Once | ORAL | Status: AC
  Administered 2022-05-22: 1000 mg via ORAL

## 2022-05-22 MED ORDER — OXYCODONE HCL 5 MG/5ML PO SOLN: 5.0000 mg | Freq: Once | ORAL | Status: DC | PRN

## 2022-05-22 MED ORDER — PHENYLEPHRINE 80 MCG/ML (10ML) SYRINGE FOR IV PUSH (FOR BLOOD PRESSURE SUPPORT)
PREFILLED_SYRINGE | INTRAVENOUS | Status: DC | PRN
  Administered 2022-05-22 (×2): 160 ug via INTRAVENOUS

## 2022-05-22 MED ORDER — PROPOFOL 10 MG/ML IV BOLUS
INTRAVENOUS | Status: DC | PRN
  Administered 2022-05-22: 150 mg via INTRAVENOUS
  Administered 2022-05-22: 50 mg via INTRAVENOUS

## 2022-05-22 MED ORDER — ONDANSETRON HCL 4 MG/2ML IJ SOLN
INTRAMUSCULAR | Status: AC
  Filled 2022-05-22: qty 2

## 2022-05-22 MED ORDER — EPINEPHRINE PF 1 MG/ML IJ SOLN
INTRAMUSCULAR | Status: DC | PRN
  Administered 2022-05-22: 1 mg

## 2022-05-22 MED ORDER — CHLORHEXIDINE GLUCONATE 0.12 % MT SOLN
15.0000 mL | Freq: Once | OROMUCOSAL | Status: AC
  Administered 2022-05-22: 15 mL via OROMUCOSAL
  Filled 2022-05-22: qty 15

## 2022-05-22 MED ORDER — LIDOCAINE 2% (20 MG/ML) 5 ML SYRINGE
INTRAMUSCULAR | Status: DC | PRN
  Administered 2022-05-22: 40 mg via INTRAVENOUS

## 2022-05-22 MED ORDER — DEXAMETHASONE SODIUM PHOSPHATE 10 MG/ML IJ SOLN
INTRAMUSCULAR | Status: DC | PRN
  Administered 2022-05-22: 10 mg via INTRAVENOUS

## 2022-05-22 MED ORDER — FENTANYL CITRATE (PF) 100 MCG/2ML IJ SOLN: 25.0000 ug | INTRAMUSCULAR | Status: DC | PRN

## 2022-05-22 MED ORDER — EPINEPHRINE PF 1 MG/ML IJ SOLN
INTRAMUSCULAR | Status: AC
  Filled 2022-05-22: qty 1

## 2022-05-22 MED ORDER — PROMETHAZINE HCL 25 MG/ML IJ SOLN: 6.2500 mg | INTRAMUSCULAR | Status: DC | PRN

## 2022-05-22 MED ORDER — SUGAMMADEX SODIUM 200 MG/2ML IV SOLN
INTRAVENOUS | Status: DC | PRN
  Administered 2022-05-22: 200 mg via INTRAVENOUS

## 2022-05-22 MED ORDER — LACTATED RINGERS IV SOLN
INTRAVENOUS | Status: DC
Start: 2022-05-22 — End: 2022-05-22

## 2022-05-22 MED ORDER — OXYCODONE HCL 5 MG PO TABS: 5.0000 mg | ORAL_TABLET | Freq: Once | ORAL | Status: DC | PRN

## 2022-05-22 MED ORDER — ORAL CARE MOUTH RINSE
15.0000 mL | Freq: Once | OROMUCOSAL | Status: AC
Start: 2022-05-22 — End: 2022-05-22

## 2022-05-22 MED ORDER — EPHEDRINE 5 MG/ML INJ
INTRAVENOUS | Status: AC
  Filled 2022-05-22: qty 5

## 2022-05-22 MED ORDER — ROCURONIUM BROMIDE 10 MG/ML (PF) SYRINGE
PREFILLED_SYRINGE | INTRAVENOUS | Status: DC | PRN
  Administered 2022-05-22: 60 mg via INTRAVENOUS

## 2022-05-22 MED ORDER — 0.9 % SODIUM CHLORIDE (POUR BTL) OPTIME
TOPICAL | Status: DC | PRN
  Administered 2022-05-22: 200 mL

## 2022-05-22 MED ORDER — FENTANYL CITRATE (PF) 250 MCG/5ML IJ SOLN
INTRAMUSCULAR | Status: DC | PRN
  Administered 2022-05-22: 50 ug via INTRAVENOUS
  Administered 2022-05-22: 100 ug via INTRAVENOUS

## 2022-05-22 MED ORDER — PHENYLEPHRINE 80 MCG/ML (10ML) SYRINGE FOR IV PUSH (FOR BLOOD PRESSURE SUPPORT)
PREFILLED_SYRINGE | INTRAVENOUS | Status: AC
  Filled 2022-05-22: qty 10

## 2022-05-22 SURGICAL SUPPLY — 43 items
ADAPTER VALVE BIOPSY EBUS (MISCELLANEOUS) IMPLANT
ADPTR VALVE BIOPSY EBUS (MISCELLANEOUS)
BLADE CLIPPER SURG (BLADE) ×2 IMPLANT
BRUSH CYTOL CELLEBRITY 1.5X140 (MISCELLANEOUS) ×1 IMPLANT
CANISTER SUCT 3000ML PPV (MISCELLANEOUS) ×2 IMPLANT
CNTNR URN SCR LID CUP LEK RST (MISCELLANEOUS) ×1 IMPLANT
CONT SPEC 4OZ STRL OR WHT (MISCELLANEOUS) ×2
COVER BACK TABLE 60X90IN (DRAPES) ×2 IMPLANT
FILTER STRAW FLUID ASPIR (MISCELLANEOUS) ×1 IMPLANT
FORCEPS BIOP RJ4 1.8 (CUTTING FORCEPS) IMPLANT
FORCEPS RADIAL JAW LRG 4 PULM (INSTRUMENTS) IMPLANT
GAUZE 4X4 16PLY ~~LOC~~+RFID DBL (SPONGE) ×2 IMPLANT
GAUZE SPONGE 4X4 12PLY STRL (GAUZE/BANDAGES/DRESSINGS) IMPLANT
GLOVE SURG SIGNA 7.5 PF LTX (GLOVE) ×2 IMPLANT
GOWN STRL REUS W/ TWL XL LVL3 (GOWN DISPOSABLE) ×1 IMPLANT
GOWN STRL REUS W/TWL XL LVL3 (GOWN DISPOSABLE) ×2
KIT CLEAN ENDO COMPLIANCE (KITS) ×4 IMPLANT
KIT TURNOVER KIT B (KITS) ×2 IMPLANT
MARKER SKIN DUAL TIP RULER LAB (MISCELLANEOUS) ×2 IMPLANT
NDL ASPIRATION VIZISHOT 19G (NEEDLE) IMPLANT
NDL ASPIRATION VIZISHOT 21G (NEEDLE) ×1 IMPLANT
NDL BLUNT 18X1 FOR OR ONLY (NEEDLE) IMPLANT
NEEDLE ASPIRATION VIZISHOT 19G (NEEDLE) ×2 IMPLANT
NEEDLE ASPIRATION VIZISHOT 21G (NEEDLE) ×2 IMPLANT
NEEDLE BLUNT 18X1 FOR OR ONLY (NEEDLE) IMPLANT
NS IRRIG 1000ML POUR BTL (IV SOLUTION) ×2 IMPLANT
OIL SILICONE PENTAX (PARTS (SERVICE/REPAIRS)) ×2 IMPLANT
PAD ARMBOARD 7.5X6 YLW CONV (MISCELLANEOUS) ×4 IMPLANT
SPONGE T-LAP 18X18 ~~LOC~~+RFID (SPONGE) ×8 IMPLANT
SPONGE T-LAP 4X18 ~~LOC~~+RFID (SPONGE) ×2 IMPLANT
SYR 20ML ECCENTRIC (SYRINGE) ×2 IMPLANT
SYR 20ML LL LF (SYRINGE) ×2 IMPLANT
SYR 3ML LL SCALE MARK (SYRINGE) IMPLANT
SYR 5ML LL (SYRINGE) ×2 IMPLANT
SYR 5ML LUER SLIP (SYRINGE) ×2 IMPLANT
TOWEL GREEN STERILE (TOWEL DISPOSABLE) ×2 IMPLANT
TOWEL GREEN STERILE FF (TOWEL DISPOSABLE) ×2 IMPLANT
TRAP SPECIMEN MUCUS 40CC (MISCELLANEOUS) ×2 IMPLANT
TUBE CONNECTING 20X1/4 (TUBING) ×2 IMPLANT
VALVE BIOPSY  SINGLE USE (MISCELLANEOUS) ×2
VALVE BIOPSY SINGLE USE (MISCELLANEOUS) ×1 IMPLANT
VALVE SUCTION BRONCHIO DISP (MISCELLANEOUS) ×2 IMPLANT
WATER STERILE IRR 1000ML POUR (IV SOLUTION) ×2 IMPLANT

## 2022-05-22 NOTE — Anesthesia Postprocedure Evaluation (Signed)
Anesthesia Post Note  Patient: [name]  Procedure(s) Performed: VIDEO BRONCHOSCOPY WITH ENDOBRONCHIAL ULTRASOUND ([facility])     Patient location during evaluation: PACU Anesthesia Type: General Level of consciousness: awake and alert, patient cooperative and oriented Pain management: pain level controlled Vital Signs Assessment: post-procedure vital signs reviewed and stable Respiratory status: spontaneous breathing, nonlabored ventilation and respiratory function stable Cardiovascular status: blood pressure returned to baseline and stable Postop Assessment: no apparent nausea or vomiting and able to ambulate Anesthetic complications: no   No notable events documented.  Last Vitals:  Vitals:   05/22/22 1518 05/22/22 1533  BP: (!) 150/62 (!) 147/74  Pulse: 91 87  Resp: 14 18  Temp:  36.6 C  SpO2: 93% 94%    Last Pain:  Vitals:   05/22/22 1503  TempSrc:   PainSc: 0-No pain                 [name],[name]

## 2022-05-22 NOTE — Anesthesia Procedure Notes (Signed)
Procedure Name: Intubation Date/Time: 05/22/2022 2:12 PM  Performed by: Lance Coon, CRNAPre-anesthesia Checklist: Emergency Drugs available, Patient identified, Suction available, Patient being monitored and Timeout performed Patient Re-evaluated:Patient Re-evaluated prior to induction Oxygen Delivery Method: Circle system utilized Preoxygenation: Pre-oxygenation with 100% oxygen Induction Type: IV induction Ventilation: Mask ventilation without difficulty Laryngoscope Size: Miller and 3 Grade View: Grade I Tube type: Oral Tube size: 8.5 mm Number of attempts: 1 Airway Equipment and Method: Stylet Placement Confirmation: ETT inserted through vocal cords under direct vision, positive ETCO2 and breath sounds checked- equal and bilateral Secured at: 21 cm Tube secured with: Tape Dental Injury: Teeth and Oropharynx as per pre-operative assessment

## 2022-05-22 NOTE — Interval H&P Note (Signed)
History and Physical Interval Note:  05/22/2022 1:05 PM  Amy [name]  has presented today for surgery, with the diagnosis of RIGHT HILAR MASS.  The various methods of treatment have been discussed with the patient and family. After consideration of risks, benefits and other options for treatment, the patient has consented to  Procedure(s): [place] (N/A) as a surgical intervention.  The patient's history has been reviewed, patient examined, no change in status, stable for surgery.  I have reviewed the patient's chart and labs.  Questions were answered to the patient's satisfaction.     [name]

## 2022-05-22 NOTE — Transfer of Care (Signed)
Immediate Anesthesia Transfer of Care Note  Patient: Amy [name]  Procedure(s) Performed: VIDEO BRONCHOSCOPY WITH ENDOBRONCHIAL ULTRASOUND ([place])  Patient Location: PACU  Anesthesia Type:General  Level of Consciousness: drowsy and patient cooperative  Airway & Oxygen Therapy: Patient Spontanous Breathing and Patient connected to face mask oxygen  Post-op Assessment: Report given to RN and Post -op Vital signs reviewed and stable  Post vital signs: Reviewed and stable  Last Vitals:  Vitals Value Taken Time  BP 164/88 05/22/22 1503  Temp    Pulse 101 05/22/22 1505  Resp 17 05/22/22 1505  SpO2 93 % 05/22/22 1505  Vitals shown include unvalidated device data.  Last Pain:  Vitals:   05/22/22 1106  TempSrc:   PainSc: 0-No pain         Complications: No notable events documented.

## 2022-05-22 NOTE — Brief Op Note (Signed)
05/22/2022  3:07 PM  PATIENT:  Amy [name]  76 y.o. female  PRE-OPERATIVE DIAGNOSIS:  RIGHT HILAR MASS  POST-OPERATIVE DIAGNOSIS:  RIGHT HILAR MASS- NON-SMALL CELL CARCINOMA  PROCEDURE:  Procedure(s): VIDEO BRONCHOSCOPY WITH ENDOBRONCHIAL ULTRASOUND (N/A) Brushings, endobronchial biopsies and needle aspirations  SURGEON:  Surgeon(s) and Role:    * [name], MD - Primary  PHYSICIAN ASSISTANT:   ASSISTANTS: none   ANESTHESIA:   general  EBL:  5 mL   BLOOD ADMINISTERED:none  DRAINS: none   LOCAL MEDICATIONS USED:  NONE  SPECIMEN:  Source of Specimen:  Right hilar mass, level 7 LN  DISPOSITION OF SPECIMEN:  PATHOLOGY  COUNTS:  NO endoscopic  TOURNIQUET:  * No tourniquets in log *  DICTATION: .Other Dictation: Dictation Number -  PLAN OF CARE: Discharge to home after PACU  PATIENT DISPOSITION:  PACU - hemodynamically stable.   Delay start of Pharmacological VTE agent (>24hrs) due to surgical blood loss or risk of bleeding: not applicable

## 2022-05-22 NOTE — Discharge Instructions (Addendum)
Do not drive or engage in heavy physical activity for 24 hours.  You may resume normal activities tomorrow.  You may cough up small amounts of blood over the next few days.  You may acetaminophen (Tylenol) if needed for discomfort.  You may use an over the counter cough medication and/ or throat lozenges if needed.  Call [phone] if you develop chest pain, shortness of breath, fever > 101 F or cough up more than 2 tablespoons of blood.  Follow up with Dr. Julien Nordmann as scheduled.

## 2022-05-22 NOTE — Anesthesia Preprocedure Evaluation (Addendum)
Anesthesia Evaluation  Patient identified by MRN, date of birth, ID band Patient awake    Reviewed: Allergy & Precautions, NPO status , Patient's Chart, lab work & pertinent test results  History of Anesthesia Complications Negative for: history of anesthetic complications  Airway Mallampati: II  TM Distance: >3 FB Neck ROM: Full    Dental  (+) Partial Lower, Partial Upper, Dental Advisory Given, Missing   Pulmonary shortness of breath, COPD,  COPD inhaler, Patient abstained from smoking., former smoker,  H/o lung cancer   breath sounds clear to auscultation       Cardiovascular hypertension, Pt. on medications (-) angina Rhythm:Regular Rate:Normal     Neuro/Psych Anxiety Depression negative neurological ROS     GI/Hepatic Neg liver ROS, GERD  Medicated and Controlled,  Endo/Other  negative endocrine ROS  Renal/GU Renal InsufficiencyRenal disease     Musculoskeletal  (+) Arthritis ,   Abdominal   Peds  Hematology negative hematology ROS (+)   Anesthesia Other Findings   Reproductive/Obstetrics                            Anesthesia Physical Anesthesia Plan  ASA: 3  Anesthesia Plan: General   Post-op Pain Management: Tylenol PO (pre-op)*   Induction: Intravenous  PONV Risk Score and Plan: 3 and Ondansetron, Dexamethasone and Treatment may vary due to age or medical condition  Airway Management Planned:   Additional Equipment: None  Intra-op Plan:   Post-operative Plan: Extubation in OR  Informed Consent: I have reviewed the patients History and Physical, chart, labs and discussed the procedure including the risks, benefits and alternatives for the proposed anesthesia with the patient or authorized representative who has indicated his/her understanding and acceptance.     Dental advisory given  Plan Discussed with: CRNA and Surgeon  Anesthesia Plan Comments:         Anesthesia Quick Evaluation

## 2022-05-22 NOTE — Op Note (Signed)
Amy [name], Amy [name] MEDICAL RECORD NO: [id number] ACCOUNT NO: [id number] DATE OF BIRTH: 09-Oct-1946 FACILITY: [facility] LOCATION: [facility] PHYSICIAN: Revonda Standard. Roxan Hockey, MD  Operative Report   DATE OF PROCEDURE: 05/22/2022   PREOPERATIVE DIAGNOSIS:  Right hilar mass.  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  PROCEDURE:   Bronchoscopy with brushings and endobronchial biopsies and  Endobronchial ultrasound with mediastinal lymph node aspirations.  SURGEON:  Revonda Standard. Roxan Hockey, MD  ASSISTANT:  None.  ANESTHESIA:  General.  FINDINGS:  Mass at the origin of right lower lobe with extension into bronchus intermedius.  Non-small cell carcinoma on brushings.  Level 7 lymph node adequate specimen.  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  She recently had a followup CT, which showed an  increase in soft tissue density around the right hilum.  On PET/CT, that area was hypermetabolic.  She was advised to undergo bronchoscopy and endobronchial ultrasound for diagnostic purposes.  The indications, risks, benefits, and alternatives were  discussed in detail with the patient.  She understood and accepted the risks and agreed to proceed.  DESCRIPTION OF PROCEDURE:  Amy [name] was brought to the operating room on 05/22/2022.  She had induction of general anesthesia and was intubated.  Sequential compression devices were placed on the calves for DVT prophylaxis.  A Bair Hugger was placed for  active warming.  A timeout was performed.  Flexible fiberoptic bronchoscopy was performed via the endotracheal tube. The trachea and left bronchial tree was within normal limits. On the right side the right upper lobe bronchus was normal. In the distal portion of the  bronchus intermedius there was a mass  lesion nearly completely occluding the right lower lobe bronchus.  This mass was very friable and bled easily.  Topical epinephrine was applied to help with hemostasis.  Brushings were performed.  And as those were  being examined, the bronchoscope was removed and the endobronchial ultrasound probe was advanced.  There was an enlarged level 7 node and multiple aspirations were taken of this node. Aspirations were done with realtime ultrasound visualization.  Samples  were obtained both with and without suction applied.  There was minimal bleeding with the lymph node aspirations.  The endobronchial ultrasound scope was removed.  By this time the brushings had been examined and were positive for non-small cell carcinoma.   The bronchoscope was reinserted and redirected to the origin of the right lower lobe bronchus.  Multiple endobronchial biopsies were obtained, again topical epinephrine was applied to help with hemostasis.  The airway was recannulated to establish patent  subsegmental bronchi.  The needle aspirations of the level 7 node had returned being adequate for diagnosis of malignancy.  All of the endobronchial biopsy specimens were sent for permanent pathology and PD-L1 and FoundationOne testing was requested in  addition.  Final inspection was made for hemostasis.  There was no ongoing bleeding.  The bronchoscope was removed.  The patient was extubated in the operating room and taken to the postanesthetic care unit in good condition.     SUJ D: 05/22/2022 6:17:12 pm T: 05/22/2022 10:52:00 pm  JOB: [id number]/ [id number]

## 2022-05-23 ENCOUNTER — Encounter (HOSPITAL_COMMUNITY): Payer: Self-pay | Admitting: Thoracic Surgery (Cardiothoracic Vascular Surgery)

## 2022-05-23 LAB — CYTOLOGY - NON PAP

## 2022-05-23 LAB — SURGICAL PATHOLOGY

## 2022-05-25 ENCOUNTER — Other Ambulatory Visit: Payer: Self-pay

## 2022-05-25 ENCOUNTER — Inpatient Hospital Stay: Payer: Medicare HMO | Admitting: Internal Medicine

## 2022-05-25 ENCOUNTER — Inpatient Hospital Stay: Payer: Medicare HMO | Attending: Internal Medicine

## 2022-05-25 VITALS — BP 172/81 | HR 86 | Resp 17 | Wt 173.2 lb

## 2022-05-25 DIAGNOSIS — R062 Wheezing: Secondary | ICD-10-CM | POA: Insufficient documentation

## 2022-05-25 DIAGNOSIS — C3490 Malignant neoplasm of unspecified part of unspecified bronchus or lung: Secondary | ICD-10-CM | POA: Diagnosis not present

## 2022-05-25 DIAGNOSIS — Z79899 Other long term (current) drug therapy: Secondary | ICD-10-CM | POA: Diagnosis not present

## 2022-05-25 DIAGNOSIS — Z5111 Encounter for antineoplastic chemotherapy: Secondary | ICD-10-CM | POA: Diagnosis not present

## 2022-05-25 DIAGNOSIS — R1310 Dysphagia, unspecified: Secondary | ICD-10-CM | POA: Diagnosis not present

## 2022-05-25 DIAGNOSIS — Z5112 Encounter for antineoplastic immunotherapy: Secondary | ICD-10-CM | POA: Insufficient documentation

## 2022-05-25 DIAGNOSIS — C3431 Malignant neoplasm of lower lobe, right bronchus or lung: Secondary | ICD-10-CM | POA: Diagnosis not present

## 2022-05-25 DIAGNOSIS — Z923 Personal history of irradiation: Secondary | ICD-10-CM | POA: Insufficient documentation

## 2022-05-25 MED ORDER — PROCHLORPERAZINE MALEATE 10 MG PO TABS: 10.0000 mg | ORAL_TABLET | Freq: Four times a day (QID) | ORAL | 0 refills | Status: AC | PRN

## 2022-05-25 NOTE — Progress Notes (Signed)
[facility] Telephone:([phone]) [phone]   Fax:([phone]) [phone]  OFFICE PROGRESS NOTE  [name], MD [place] [address]  DIAGNOSIS: Recurrent lung cancer initially diagnosed as stage IA (T1, N0, M0) non-small cell lung cancer, squamous cell carcinoma.  She presented with a right lower lobe lung nodule.  She was diagnosed in June 2021.    PRIOR THERAPY: Right lower lobe superior segmentectomy and lymph node dissection under the care of Dr. Roxan Hockey on 03/17/2020.   CURRENT THERAPY: Concurrent chemoradiation with weekly carboplatin for AUC of 2 and paclitaxel 45 Mg/M2.  First dose 06/05/2022  INTERVAL HISTORY: Amy [name] 76 y.o. female returns to the clinic today for follow-up visit accompanied by her daughter.  The patient is feeling fine today with no concerning complaints except for mild dysphagia.  She denied having any current chest pain, shortness of breath, cough or hemoptysis.  She denied having any fever or chills.  She has no nausea, vomiting, diarrhea or constipation.  She has no headache or visual changes.  She denied having any recent weight loss or night sweats.  She has no headache or visual changes.  She was seen by Dr. Roxan Hockey recently and she underwent repeat bronchoscopy with biopsy of the suspicious disease recurrence in the right hilar area and the final pathology was consistent with recurrent non-small cell lung cancer, squamous cell carcinoma.  She is here today for evaluation and recommendation regarding treatment of her condition.  MEDICAL HISTORY: Past Medical History:  Diagnosis Date   Anxiety    Arthritis    Chronic kidney disease    COPD (chronic obstructive pulmonary disease) ([facility]) 2021   Depression 01/28/2020   Dyspnea    History of hiatal hernia    Hyperlipidemia    Hypertension    Pneumonia    Squamous cell carcinoma of bronchus in right lower lobe ([place]) 04/06/2020   T1, N0, stage Ia.  Status  post right lower lobe superior segmentectomy    ALLERGIES:  has No Known Allergies.  MEDICATIONS:  Current Outpatient Medications  Medication Sig Dispense Refill   albuterol (VENTOLIN HFA) 108 (90 Base) MCG/ACT inhaler Inhale 2 puffs into the lungs every 6 (six) hours as needed for wheezing or shortness of breath.     ipratropium (ATROVENT) 0.06 % nasal spray Place 2 sprays into both nostrils daily as needed for rhinitis.     Multiple Vitamins-Minerals (MULTIVITAMIN ADULT) CHEW Chew 1 each by mouth daily.     omeprazole (PRILOSEC) 40 MG capsule Take 40 mg by mouth daily.     OVER THE COUNTER MEDICATION Take 1 capsule by mouth daily. Linseed oil + Omega 3 combination supplement     raloxifene (EVISTA) 60 MG tablet Take 60 mg by mouth daily.     sertraline (ZOLOFT) 50 MG tablet Take 50 mg by mouth daily.     simvastatin (ZOCOR) 40 MG tablet Take 40 mg by mouth every evening.      traZODone (DESYREL) 50 MG tablet Take 50 mg by mouth at bedtime as needed for sleep.     No current facility-administered medications for this visit.    SURGICAL HISTORY:  Past Surgical History:  Procedure Laterality Date   APPENDECTOMY     EYE SURGERY     INTERCOSTAL NERVE BLOCK Right 03/17/2020   Procedure: Intercostal Nerve Block;  Surgeon: [name], MD;  Location: [facility];  Service: Thoracic;  Laterality: Right;   LUNG  SURGERY Right 03/17/2020   NODE DISSECTION Right 03/17/2020   Procedure: Node Dissection;  Surgeon: [name], MD;  Location: [facility];  Service: Thoracic;  Laterality: Right;   TUBAL LIGATION     VIDEO BRONCHOSCOPY WITH ENDOBRONCHIAL ULTRASOUND N/A 05/22/2022   Procedure: VIDEO BRONCHOSCOPY WITH ENDOBRONCHIAL ULTRASOUND;  Surgeon: [name], MD;  Location: [facility];  Service: Thoracic;  Laterality: N/A;    REVIEW OF SYSTEMS:  Constitutional: positive for fatigue Eyes: negative Ears, nose, mouth, throat, and face: negative Respiratory: positive for  wheezing Cardiovascular: negative Gastrointestinal: positive for dysphagia Genitourinary:negative Integument/breast: negative Hematologic/lymphatic: negative Musculoskeletal:negative Neurological: negative Behavioral/Psych: negative Endocrine: negative Allergic/Immunologic: negative   PHYSICAL EXAMINATION: General appearance: alert, cooperative, fatigued, and no distress Head: Normocephalic, without obvious abnormality, atraumatic Neck: no adenopathy, no JVD, supple, symmetrical, trachea midline, and thyroid not enlarged, symmetric, no tenderness/mass/nodules Lymph nodes: Cervical, supraclavicular, and axillary nodes normal. Resp: wheezes RUL Back: symmetric, no curvature. ROM normal. No CVA tenderness. Cardio: regular rate and rhythm, S1, S2 normal, no murmur, click, rub or gallop GI: soft, non-tender; bowel sounds normal; no masses,  no organomegaly Extremities: extremities normal, atraumatic, no cyanosis or edema Neurologic: Alert and oriented X 3, normal strength and tone. Normal symmetric reflexes. Normal coordination and gait  ECOG PERFORMANCE STATUS: 1 - Symptomatic but completely ambulatory  Blood pressure (!) 172/81, pulse 86, resp. rate 17, weight 173 lb 3 oz (78.6 kg), SpO2 96 %.  LABORATORY DATA: Lab Results  Component Value Date   WBC 8.0 05/22/2022   HGB 12.3 05/22/2022   HCT 38.3 05/22/2022   MCV 85.9 05/22/2022   PLT 256 05/22/2022      Chemistry      Component Value Date/Time   NA 140 05/22/2022 1114   K 4.6 05/22/2022 1114   CL 105 05/22/2022 1114   CO2 27 05/22/2022 1114   BUN 21 05/22/2022 1114   CREATININE 1.25 (H) 05/22/2022 1114   CREATININE 1.15 (H) 04/17/2022 0833      Component Value Date/Time   CALCIUM 9.6 05/22/2022 1114   ALKPHOS 78 05/22/2022 1114   AST 22 05/22/2022 1114   AST 20 04/17/2022 0833   ALT 12 05/22/2022 1114   ALT 21 04/17/2022 0833   BILITOT 0.9 05/22/2022 1114   BILITOT 0.6 04/17/2022 2979       RADIOGRAPHIC  STUDIES: DG Chest 2 View  Result Date: 05/22/2022 CLINICAL DATA:  [id number] preop chest exam; hilar mass EXAM: CHEST - 2 VIEW COMPARISON:  Correlation is made with a CT of the chest dated July 01/02/2022 in comparison with a chest x-ray dated April 27, 2020 FINDINGS: The heart size and mediastinal contours are stable. Right infrahilar fullness corresponding to the mass seen in the recent CT of the chest. Blunting of the right CP angle of likely pleural thickening. No consolidation, pleural effusion or vascular congestion. Multiple old healed right rib fractures. IMPRESSION: No active cardiopulmonary disease. Fullness at the right infrahilar region corresponding to the infrahilar mass. Electronically Signed   By: Frazier Richards M.D.   On: 05/22/2022 10:54   NM PET Image Restage (PS) Skull Base to Thigh (F-18 FDG)  Result Date: 04/30/2022 CLINICAL DATA:  Subsequent treatment strategy for non-small cell lung cancer. Previous superior segmentectomy 03/17/2020. Increased right infrahilar soft tissue thickening and low right paratracheal adenopathy on recent CT. EXAM: NUCLEAR MEDICINE PET SKULL BASE TO THIGH TECHNIQUE: 8.4 mCi F-18 FDG was injected intravenously. Full-ring PET imaging was performed from the skull base to  thigh after the radiotracer. CT data was obtained and used for attenuation correction and anatomic localization. Fasting blood glucose: 68 mg/dl COMPARISON:  Chest CT 04/17/2022 and 10/18/2021.  PET-CT 02/11/2020. FINDINGS: Mediastinal blood pool activity: SUV max 2.9 NECK: No hypermetabolic cervical lymph nodes are identified.Minimally asymmetric uptake in the lymphoid tissue of Waldeyer's ring is within physiologic limits. No suspicious activity in the pharyngeal mucosal space. Incidental CT findings: Bilateral carotid atherosclerosis. CHEST: 9 mm left paratracheal node on image 63/4 is mildly hypermetabolic with an SUV max of 5.2. The low right paratracheal node measuring 9 mm on image 67/4 is not  significantly hypermetabolic. However, there is a small node between the midesophagus and descending aorta which demonstrates mild hypermetabolic activity for size (SUV max 5.3). There is intense hypermetabolic activity within the enlarging right infrahilar soft tissue which measures up to 3.5 x 2.0 cm on image 77/4 and has an SUV max of 20.5. No peripheral hypermetabolic pulmonary activity or suspicious nodularity. Incidental CT findings: Otherwise stable postsurgical changes at the right lung base. Moderate centrilobular and paraseptal emphysema. Diffuse atherosclerosis of the aorta, great vessels and coronary arteries. ABDOMEN/PELVIS: There is no hypermetabolic activity within the liver, adrenal glands, spleen or pancreas. There is no hypermetabolic nodal activity. Mild metabolic activity at the gastroesophageal junction, suggesting reflux. Incidental CT findings: Aortic and branch vessel atherosclerosis without aneurysm. Diverticular changes within the distal colon. SKELETON: Mild hypermetabolic activity within the superior endplate of L1 (SUV max 5.9). Mild associated endplate depression, favoring a mild benign compression deformity. No osseous uptake suspicious for metastatic disease. Incidental CT findings: none IMPRESSION: 1. Intensely hypermetabolic recurrence in the right infrahilar region. 2. Mildly prominent left paratracheal and paraesophageal lymph nodes are also mildly hypermetabolic for size, suspicious for nodal metastases. 3. No distant metastases identified. 4. Uptake within the superior endplate of the L1 vertebral body, favoring to reflect a mild benign compression deformity. Electronically Signed   By: Richardean Sale M.D.   On: 04/30/2022 14:55    ASSESSMENT AND PLAN: This is a very pleasant 76 years old white female with suspicious recurrent non-small cell lung cancer initially diagnosed as history of stage IA (T1 a, N0, M0) non-small cell lung cancer, squamous cell carcinoma presented with  right lower lobe lung nodule diagnosed in June 2021 status post right lower lobe superior segmentectomy with lymph node dissection under the care of Dr. Roxan Hockey on March 17, 2020.  The patient has evidence for disease recurrence in July 2023. The patient is feeling fine today with no concerning complaints except for the back pain. She had repeat CT scan of the chest performed recently.  I personally and independently reviewed the scan images and discussed the results with the patient today. Her scan showed mild increase in the right infrahilar soft tissue encasing the bronchus intermedius and disease recurrence could not be completely excluded.  There was also new mild bilateral lower paratracheal lymphadenopathy that is nonspecific and nodal metastasis could not be completely excluded. The patient had a PET scan performed recently and that showed intensely hypermetabolic recurrence in the right infrahilar region with mildly prominent left paratracheal and paraesophageal lymph nodes also mildly hypermetabolic for size and suspicious for nodal metastasis but no distant metastatic disease. The patient underwent repeat bronchoscopy with EBUS under the care of Dr. Roxan Hockey and the final pathology was consistent with recurrent squamous cell carcinoma of the lung. I had a lengthy discussion with the patient and her daughter today about her current disease condition as  well as treatment options. I recommended for the patient to complete the staging workup by ordering MRI of the brain to rule out brain metastasis. I also discussed with the patient her treatment with a course of concurrent chemoradiation with weekly carboplatin for AUC of 2 and paclitaxel 45 Mg/M2. I will refer the patient to radiation oncology for discussion of the radiotherapy option. The patient will also have a chemotherapy education class before the first dose of her treatment. I will call her pharmacy with prescription for Compazine 10  mg p.o. every 6 hours as needed for nausea. The patient will come back for follow-up visit 1 week after her treatment for evaluation and management of any adverse effect of her treatment. She was advised to call immediately if she has any other concerning symptoms in the interval.  The patient voices understanding of current disease status and treatment options and is in agreement with the current care plan.  All questions were answered. The patient knows to call the clinic with any problems, questions or concerns. We can certainly see the patient much sooner if necessary. The total time spent in the appointment was 40 minutes.   Disclaimer: This note was dictated with voice recognition software. Similar sounding words can inadvertently be transcribed and may not be corrected upon review.

## 2022-05-25 NOTE — Progress Notes (Signed)
START ON PATHWAY REGIMEN - Non-Small Cell Lung     A cycle is every 7 days, concurrent with RT:     Paclitaxel      Carboplatin   **Always confirm dose/schedule in your pharmacy ordering system**  Patient Characteristics: Preoperative or Nonsurgical Candidate (Clinical Staging), Stage III - Nonsurgical Candidate (Nonsquamous and Squamous), PS = 0, 1 Therapeutic Status: Preoperative or Nonsurgical Candidate (Clinical Staging) AJCC T Category: cT1c AJCC N Category: cN2 AJCC M Category: cM0 AJCC 8 Stage Grouping: IIIA ECOG Performance Status: 1 Intent of Therapy: Non-Curative / Palliative Intent, Discussed with Patient

## 2022-05-26 ENCOUNTER — Telehealth: Payer: Self-pay | Admitting: Internal Medicine

## 2022-05-26 ENCOUNTER — Other Ambulatory Visit: Payer: Self-pay

## 2022-05-26 NOTE — Telephone Encounter (Signed)
Scheduled per 32/10 los, pt has been called and confirmed

## 2022-05-27 ENCOUNTER — Other Ambulatory Visit: Payer: Self-pay

## 2022-05-27 DIAGNOSIS — C3431 Malignant neoplasm of lower lobe, right bronchus or lung: Secondary | ICD-10-CM | POA: Diagnosis not present

## 2022-05-27 DIAGNOSIS — Z87891 Personal history of nicotine dependence: Secondary | ICD-10-CM | POA: Diagnosis not present

## 2022-05-27 NOTE — Progress Notes (Signed)
  Radiation Oncology         [phone] ________________________________  Name: Amy [name] MRN: [id number]  Date: 05/29/2022  DOB: 29-Feb-1946  SIMULATION AND TREATMENT PLANNING NOTE    ICD-10-CM   1. Squamous cell carcinoma of bronchus in right lower lobe (HCC)  C34.31       DIAGNOSIS:  76 yo woman with mediastinal recurrence of squamous cell carcinoma of the right lower lung  NARRATIVE:  The patient was brought to the [facility].  Identity was confirmed.  All relevant records and images related to the planned course of therapy were reviewed.  The patient freely provided informed written consent to proceed with treatment after reviewing the details related to the planned course of therapy. The consent form was witnessed and verified by the simulation staff.  Then, the patient was set-up in a stable reproducible  supine position for radiation therapy.  CT images were obtained.  Surface markings were placed.  The CT images were loaded into the planning software.  Then the target and avoidance structures were contoured.  Treatment planning then occurred.  The radiation prescription was entered and confirmed.  Then, I designed and supervised the construction of a total of 6 medically necessary complex treatment devices, including a BodyFix immobilization mold custom fitted to the patient along with multiple multileaf collimators conformally shaped radiation around the treatment target while shielding critical structures such as the heart and spinal cord maximally documented in the [facility] note.  I have requested : 3D Simulation  I have requested a DVH of the following structures: Left lung, right lung, spinal cord, heart, esophagus, and target.  I have ordered:Nutrition Consult  SPECIAL TREATMENT PROCEDURE:  The planned course of therapy using radiation constitutes a special treatment procedure. Special care is required in the management of this patient for the following  reasons.  The patient will be receiving concurrent chemotherapy requiring careful monitoring for increased toxicities of treatment including periodic laboratory values.  The special nature of the planned course of radiotherapy will require increased physician supervision and oversight to ensure patient's safety with optimal treatment outcomes.  PLAN:  The patient will receive 66 Gy in 33 fractions.  ________________________________  Sheral Apley Tammi Klippel, M.D.

## 2022-05-29 ENCOUNTER — Ambulatory Visit
Admission: RE | Admit: 2022-05-29 | Discharge: 2022-05-29 | Disposition: A | Discharge status: Home / Self Care | Payer: Medicare HMO | Source: Ambulatory Visit | Attending: Radiation Oncology | Admitting: Radiation Oncology

## 2022-05-29 ENCOUNTER — Other Ambulatory Visit: Payer: Self-pay

## 2022-05-29 VITALS — BP 136/99 | HR 88 | Temp 97.7°F | Resp 18 | Ht 64.0 in | Wt 171.4 lb

## 2022-05-29 DIAGNOSIS — E785 Hyperlipidemia, unspecified: Secondary | ICD-10-CM | POA: Insufficient documentation

## 2022-05-29 DIAGNOSIS — I10 Essential (primary) hypertension: Secondary | ICD-10-CM | POA: Insufficient documentation

## 2022-05-29 DIAGNOSIS — C3431 Malignant neoplasm of lower lobe, right bronchus or lung: Secondary | ICD-10-CM

## 2022-05-29 DIAGNOSIS — J449 Chronic obstructive pulmonary disease, unspecified: Secondary | ICD-10-CM | POA: Insufficient documentation

## 2022-05-29 DIAGNOSIS — N189 Chronic kidney disease, unspecified: Secondary | ICD-10-CM | POA: Insufficient documentation

## 2022-05-29 DIAGNOSIS — Z79899 Other long term (current) drug therapy: Secondary | ICD-10-CM | POA: Diagnosis not present

## 2022-05-29 DIAGNOSIS — Z87891 Personal history of nicotine dependence: Secondary | ICD-10-CM | POA: Diagnosis not present

## 2022-05-29 DIAGNOSIS — Z510 Encounter for antineoplastic radiation therapy: Secondary | ICD-10-CM | POA: Diagnosis not present

## 2022-05-29 NOTE — Progress Notes (Signed)
The pharmacy team has substituted IV diphenhydramine for IV cetirizine as a premedication. Patient will be monitored for hypersensitivity reaction and adverse reactions to IV cetirizine. Thanks.    Kennith Center, Pharm.D., CPP 05/29/2022@8 :57 AM

## 2022-05-29 NOTE — Progress Notes (Signed)
Thoracic Location of Tumor / Histology: Recurrent lung cancer initially diagnosed as stage IA (T1, N0, M0) non-small cell lung cancer, squamous cell carcinoma. (Diagnosed June 2021).  04/27/2022 Dr. Julien Nordmann NM PET Image Restage Skull Base to Thigh CLINICAL DATA:  Subsequent treatment strategy for non-small cell lung cancer. Previous superior segmentectomy 03/17/2020. Increased right infrahilar soft tissue thickening and low right paratracheal adenopathy on recent CT.  FINDINGS: Mediastinal blood pool activity: SUV max 2.9   NECK:  No hypermetabolic cervical lymph nodes are identified.Minimally asymmetric uptake in the lymphoid tissue of Waldeyer's ring is within physiologic limits. No suspicious activity in the pharyngeal mucosal space.   Incidental CT findings: Bilateral carotid atherosclerosis.   CHEST: 9 mm left paratracheal node on image 63/4 is mildly hypermetabolic with an SUV max of 5.2. The low right paratracheal node measuring 9 mm on image 67/4 is not significantly hypermetabolic. However, there is a small node between the midesophagus and descending aorta which demonstrates mild hypermetabolic activity for size (SUV max 5.3). There is intense hypermetabolic activity within the enlarging right infrahilar soft tissue which measures up to 3.5 x 2.0 cm on image 77/4 and has an SUV max of 20.5. No peripheral hypermetabolic pulmonary activity or suspicious nodularity.   Incidental CT findings: Otherwise stable postsurgical changes at the right lung base. Moderate centrilobular and paraseptal emphysema.  Diffuse atherosclerosis of the aorta, great vessels and coronary arteries.   ABDOMEN/PELVIS: There is no hypermetabolic activity within the liver, adrenal glands, spleen or pancreas. There is no hypermetabolic nodal activity. Mild metabolic activity at the gastroesophageal junction, suggesting reflux.   Incidental CT findings: Aortic and branch vessel atherosclerosis without aneurysm.  Diverticular changes within the distal colon.   SKELETON:   Mild hypermetabolic activity within the superior endplate of L1 (SUV max 5.9). Mild associated endplate depression, favoring a mild benign compression deformity. No osseous uptake suspicious for metastatic disease.   Incidental CT findings: none   IMPRESSION: 1. Intensely hypermetabolic recurrence in the right infrahilar region. 2. Mildly prominent left paratracheal and paraesophageal lymph nodes are also mildly hypermetabolic for size, suspicious for nodal metastases. 3. No distant metastases identified. 4. Uptake within the superior endplate of the L1 vertebral body, favoring to reflect a mild benign compression deformity.  04/17/2022 Amy [name] CT Chest with Contrast CLINICAL DATA:  Stage IA non-small cell right lower lobe lung cancer status post superior segmentectomy 03/17/2020. Restaging.  FINDINGS: Cardiovascular: Normal heart size. No significant pericardial effusion/thickening. Three-vessel coronary atherosclerosis.  Atherosclerotic nonaneurysmal thoracic aorta. Normal caliber pulmonary arteries. No central pulmonary emboli.   Mediastinum/Nodes: No discrete thyroid nodules. Mildly patulous upper thoracic esophagus with small air-fluid level. No axillary adenopathy. Newly mildly enlarged 1.0 cm short axis diameter lower right paratracheal node (series 2/image 65). Newly mildly enlarged 1.0 cm short axis diameter left paratracheal node (series 2/image 55). No left hilar adenopathy. Mildly increased right infrahilar soft tissue encasing the bronchus intermedius measuring 3.3 x 1.9 cm (series 2/image 81), previously 2.9 x 1.7 cm.   Lungs/Pleura: No pneumothorax. No pleural effusion. Moderate paraseptal and centrilobular emphysema with diffuse bronchial wall thickening. Status post superior segmentectomy in the right lower lobe with stable parenchymal bands scattered in the mid to lower right lung. No acute  consolidative airspace disease. Irregular bandlike and nodular subpleural apical left upper lobe consolidation is unchanged and most compatible with postinfectious scarring. A few scattered tiny right upper lobe nodules measuring up to 0.2 cm (series 7/image 70) are all stable. No new significant pulmonary  nodules.   Upper abdomen: No acute abnormality.   Musculoskeletal: No aggressive appearing focal osseous lesions.  Moderate thoracic spondylosis.   IMPRESSION: 1. Mildly increased right infrahilar soft tissue encasing the bronchus intermedius, cannot exclude local tumor recurrence. 2. New mild bilateral low paratracheal  lymphadenopathy, nonspecific, cannot exclude nodal metastases. 3. Suggest further characterization with PET-CT at this time. 4. Three-vessel coronary atherosclerosis. 5. Mildly patulous upper thoracic esophagus with small air-fluid level, suggesting chronic esophageal dysmotility and/or gastroesophageal reflux. 6. Aortic Atherosclerosis (ICD10-I70.0) and Emphysema (ICD10-J43.9).   10/18/2021 Dr. Julien Nordmann CT Chest with Contrast CLINICAL DATA:  Primary Cancer Type: Lung  FINDINGS: Cardiovascular: Aortic atherosclerosis. Normal heart size.  Three-vessel coronary artery calcifications. No pericardial effusion.   Mediastinum/Nodes: No enlarged mediastinal, hilar, or axillary lymph nodes. Thyroid gland, trachea, and esophagus demonstrate no significant findings.   Lungs/Pleura: Moderate centrilobular and paraseptal emphysema.  Diffuse bilateral bronchial wall thickening. Unchanged postoperative findings status post right lower lobe superior segmentectomy. Unchanged minimal, loculated right pleural fluid. Unchanged left apical pleuroparenchymal scarring. Occasional small pulmonary nodules are stable, for example a 0.4 cm nodule of the superior segment left lower lobe (series 7, image 47)   Upper Abdomen: No acute abnormality.   Musculoskeletal: No chest wall mass or suspicious  bone lesions identified.   IMPRESSION: 1. Unchanged postoperative findings status post right lower lobe superior segmentectomy. No evidence of recurrent or metastatic disease in the chest. 2. Occasional small pulmonary nodules are stable and almost certainly benign incidental sequelae of infection or inflammation. No new nodules. Continued attention on follow-up. 3. Emphysema and diffuse bilateral bronchial wall thickening. 4. Coronary artery disease. Aortic Atherosclerosis (ICD10-I70.0) and Emphysema (ICD10-J43.9).   Tobacco/Marijuana/Snuff/ETOH use: Tobacco-Quit 03/25/2020, no drug or snuff, yes to alcohol social with wine.  Past/Anticipated interventions by cardiothoracic surgery, if any:  05/22/2022 Dr. Roxan Hockey Right Hilar Mass-Bronchoscopy with brushings and endobronchial biopsies and endobronchial ultrasound with mediastinal lymph node aspirations.  Past/Anticipated interventions by medical oncology, if any:  05/25/2022 Dr. Julien Nordmann PRIOR THERAPY: Right lower lobe superior segmentectomy and lymph node dissection under the care of Dr. Roxan Hockey on 03/17/2020  CURRENT THERAPY: Concurrent chemoradiation with weekly carboplatin for AUC of 2 and paclitaxel 45 Mg/M2. First dose 06/05/2022   Signs/Symptoms Weight changes, if any:  No Respiratory complaints, if any: Sob Hemoptysis, if any: No Pain issues, if any:  Right lower back 7/10  SAFETY ISSUES: Prior radiation? No Pacemaker/ICD?  No Possible current pregnancy? Postmenopausal Is the patient on methotrexate? No  Current Complaints / other details:

## 2022-05-29 NOTE — Progress Notes (Signed)
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 Squamous cell carcinoma of bronchus in right lower lobe (HCC)  C34.31       HISTORY OF PRESENT ILLNESS: Amy [name] is a 76 y.o. female seen at the request of Dr. Julien Nordmann.  She underwent right lower lobe superior segmentectomy and lymph node dissection under the care of Dr. Roxan Hockey on 03/17/2020.  This was for stage IA (T1, N0, M0) non-small cell lung cancer, squamous cell carcinoma.  Since then, she has been under observation.  Surveillance chest CT on 04/17/22 showed a mildly increased 3.3 cm right infrahilar soft tissue encasing the bronchus intermedius consistent with local tumor recurrence and new mild bilateral low paratracheal lymphadenopathy representing possible nodal metastases. Consequent PET-CT on 04/30/22 showed intensely hypermetabolic (RKY-70.6) recurrence in the right infrahilar region and mildly prominent left paratracheal and paraesophageal lymph nodes are also mildly hypermetabolic for size, suspicious for nodal metastases. No distant metastases identified.  Bronchoscopy and EBUS with Dr. Roxan Hockey on 05/22/22 confirmed recurrent disease.  Brain MRI is pending.  She has kindly been referred today to discuss concurrent chemotherapy and thoracic radiotherapy.  PREVIOUS RADIATION THERAPY: No  PAST MEDICAL HISTORY:  Past Medical History:  Diagnosis Date   Anxiety    Arthritis    Chronic kidney disease    COPD (chronic obstructive pulmonary disease) ([facility]) 2021   Depression 01/28/2020   Dyspnea    History of hiatal hernia     Hyperlipidemia    Hypertension    Pneumonia    Squamous cell carcinoma of bronchus in right lower lobe ([facility]) 04/06/2020   T1, N0, stage Ia.  Status post right lower lobe superior segmentectomy      PAST SURGICAL HISTORY: Past Surgical History:  Procedure Laterality Date   APPENDECTOMY     EYE SURGERY     INTERCOSTAL NERVE BLOCK Right 03/17/2020   Procedure: Intercostal Nerve Block;  Surgeon: [name], MD;  Location: [facility];  Service: Thoracic;  Laterality: Right;   LUNG SURGERY Right 03/17/2020   NODE DISSECTION Right 03/17/2020   Procedure: Node Dissection;  Surgeon: [name], MD;  Location: [facility];  Service: Thoracic;  Laterality: Right;   TUBAL LIGATION     VIDEO BRONCHOSCOPY WITH ENDOBRONCHIAL ULTRASOUND N/A 05/22/2022   Procedure: VIDEO BRONCHOSCOPY WITH ENDOBRONCHIAL ULTRASOUND;  Surgeon: [name], MD;  Location: [facility];  Service: Thoracic;  Laterality: N/A;    FAMILY HISTORY: No family history on file.  SOCIAL HISTORY:  Social History   Socioeconomic History   Marital status: Married    Spouse name: Not on file   Number of children: Not on file   Years of education: Not on file   Highest education level: Not on file  Occupational History   Not on file  Tobacco Use   Smoking status: Former    Packs/day: 1.00    Years: 55.00    Total pack years: 55.00    Types: Cigarettes    Quit date: 03/25/2020    Years since quitting: 2.1   Smokeless tobacco: Never  Vaping Use   Vaping Use: Not on file  Substance and Sexual Activity   Alcohol use: Yes    Comment: Social with wine   Drug use: Never   Sexual activity: Not on file  Other Topics Concern   Not on file  Social History Narrative   Not on file   Social Determinants of Health   Financial Resource Strain: Not on file  Food Insecurity: Not on file  Transportation Needs: Not on file  Physical Activity: Not on file  Stress: Not on file  Social Connections: Not on file   Intimate Partner Violence: Not on file    ALLERGIES: Patient has no known allergies.  MEDICATIONS:  Current Outpatient Medications  Medication Sig Dispense Refill   albuterol (VENTOLIN HFA) 108 (90 Base) MCG/ACT inhaler Inhale 2 puffs into the lungs every 6 (six) hours as needed for wheezing or shortness of breath.     ipratropium (ATROVENT) 0.06 % nasal spray Place 2 sprays into both nostrils daily as needed for rhinitis.     Multiple Vitamins-Minerals (MULTIVITAMIN ADULT) CHEW Chew 1 each by mouth daily.     omeprazole (PRILOSEC) 40 MG capsule Take 40 mg by mouth daily.     OVER THE COUNTER MEDICATION Take 1 capsule by mouth daily. Linseed oil + Omega 3 combination supplement     prochlorperazine (COMPAZINE) 10 MG tablet Take 1 tablet (10 mg total) by mouth every 6 (six) hours as needed for nausea or vomiting. 30 tablet 0   raloxifene (EVISTA) 60 MG tablet Take 60 mg by mouth daily.     sertraline (ZOLOFT) 50 MG tablet Take 50 mg by mouth daily.     simvastatin (ZOCOR) 40 MG tablet Take 40 mg by mouth every evening.      traZODone (DESYREL) 50 MG tablet Take 50 mg by mouth at bedtime as needed for sleep.     No current facility-administered medications for this visit.    REVIEW OF SYSTEMS:  On review of systems, the patient reports that she is doing well overall. she denies any chest pain, shortness of breath, cough, fevers, chills, night sweats, unintended weight changes. she denies any bowel or bladder disturbances, and denies abdominal pain, nausea or vomiting. she denies any new musculoskeletal or joint aches or pains. A complete review of systems is obtained and is otherwise negative.    PHYSICAL EXAM:  Wt Readings from Last 3 Encounters:  05/25/22 173 lb 3 oz (78.6 kg)  05/22/22 170 lb (77.1 kg)  05/19/22 170 lb (77.1 kg)   Temp Readings from Last 3 Encounters:  05/22/22 97.9 F (36.6 C)  05/03/22 97.7 F (36.5 C) (Temporal)  04/20/22 (!) 97 F (36.1 C) (Tympanic)    BP Readings from Last 3 Encounters:  05/25/22 (!) 172/81  05/22/22 (!) 147/74  05/19/22 (!) 140/79   Pulse Readings from Last 3 Encounters:  05/25/22 86  05/22/22 87  05/19/22 68    /10  In general this is a well appearing She in no acute distress. She is alert and oriented x4 and appropriate throughout the examination. HEENT reveals that the patient is normocephalic, atraumatic. Breathing is unlabored.  Neuro grossly intact   KPS = 100  100 - Normal; no complaints; no evidence of disease. 90   - Able to carry on normal activity; minor signs or symptoms of disease. 80   - Normal activity with effort; some signs or symptoms of disease. 59   - Cares for self; unable to  carry on normal activity or to do active work. 60   - Requires occasional assistance, but is able to care for most of his personal needs. 50   - Requires considerable assistance and frequent medical care. 11   - Disabled; requires special care and assistance. 69   - Severely disabled; hospital admission is indicated although death not imminent. 57   - Very sick; hospital admission necessary; active supportive treatment necessary. 10   - Moribund; fatal processes progressing rapidly. 0     - Dead  Karnofsky DA, Abelmann [place], Craver LS and [facility] [phone]) The use of the nitrogen mustards in the palliative treatment of carcinoma: with particular reference to bronchogenic carcinoma Cancer 1 [id number]  LABORATORY DATA:  Lab Results  Component Value Date   WBC 8.0 05/22/2022   HGB 12.3 05/22/2022   HCT 38.3 05/22/2022   MCV 85.9 05/22/2022   PLT 256 05/22/2022   Lab Results  Component Value Date   NA 140 05/22/2022   K 4.6 05/22/2022   CL 105 05/22/2022   CO2 27 05/22/2022   Lab Results  Component Value Date   ALT 12 05/22/2022   AST 22 05/22/2022   ALKPHOS 78 05/22/2022   BILITOT 0.9 05/22/2022     RADIOGRAPHY: DG Chest 2 View  Result Date: 05/22/2022 CLINICAL DATA:  [id number] preop chest exam; hilar  mass EXAM: CHEST - 2 VIEW COMPARISON:  Correlation is made with a CT of the chest dated July 01/02/2022 in comparison with a chest x-ray dated April 27, 2020 FINDINGS: The heart size and mediastinal contours are stable. Right infrahilar fullness corresponding to the mass seen in the recent CT of the chest. Blunting of the right CP angle of likely pleural thickening. No consolidation, pleural effusion or vascular congestion. Multiple old healed right rib fractures. IMPRESSION: No active cardiopulmonary disease. Fullness at the right infrahilar region corresponding to the infrahilar mass. Electronically Signed   By: Frazier Richards M.D.   On: 05/22/2022 10:54      IMPRESSION/PLAN: 1. 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  She may benefit from concurrent chemoradiation with curative intent.  Today, I talked to the patient and family about the findings and work-up thus far.  We discussed the natural history of locally advanced unresectable non-small cell lung cancer and general treatment, highlighting the role of radiotherapy in the management.  We discussed the available radiation techniques, and focused on the details of logistics and delivery.  We reviewed the anticipated acute and late sequelae associated with radiation in this setting.  The patient was encouraged to ask questions that I answered to the best of my ability.  I filled out a patient counseling form during our discussion including treatment diagrams.  We retained a copy for our records.  The patient would like to proceed with radiation and will be scheduled for CT simulation and start radiation with chemo on 06/05/22.  I personally spent 60 minutes in this encounter including chart review, reviewing radiological studies, meeting face-to-face with the patient, entering orders and completing documentation.      [name], MD  [facility]   Radiation Oncology Direct Dial: [phone]  Fax: [phone] [email or web address]  Skype  [email or web address]

## 2022-05-29 NOTE — Progress Notes (Signed)
Pharmacist Chemotherapy Monitoring - Initial Assessment    Anticipated start date: 06/05/22   The following has been reviewed per standard work regarding the patient's treatment regimen: The patient's diagnosis, treatment plan and drug doses, and organ/hematologic function Lab orders and baseline tests specific to treatment regimen  The treatment plan start date, drug sequencing, and pre-medications Prior authorization status  Patient's documented medication list, including drug-drug interaction screen and prescriptions for anti-emetics and supportive care specific to the treatment regimen The drug concentrations, fluid compatibility, administration routes, and timing of the medications to be used The patient's access for treatment and lifetime cumulative dose history, if applicable  The patient's medication allergies and previous infusion related reactions, if applicable   Changes made to treatment plan:  N/A  Follow up needed:  N/A   Kennith Center, Pharm.D., CPP 05/29/2022@4 :26 PM

## 2022-05-30 ENCOUNTER — Ambulatory Visit (HOSPITAL_COMMUNITY)
Admission: RE | Admit: 2022-05-30 | Discharge: 2022-05-30 | Disposition: A | Discharge status: Home / Self Care | Payer: Medicare HMO | Source: Ambulatory Visit | Attending: Internal Medicine | Admitting: Internal Medicine

## 2022-05-30 DIAGNOSIS — C3490 Malignant neoplasm of unspecified part of unspecified bronchus or lung: Secondary | ICD-10-CM | POA: Insufficient documentation

## 2022-05-30 DIAGNOSIS — Z510 Encounter for antineoplastic radiation therapy: Secondary | ICD-10-CM | POA: Diagnosis not present

## 2022-05-30 DIAGNOSIS — Z87891 Personal history of nicotine dependence: Secondary | ICD-10-CM | POA: Diagnosis not present

## 2022-05-30 DIAGNOSIS — C3431 Malignant neoplasm of lower lobe, right bronchus or lung: Secondary | ICD-10-CM | POA: Diagnosis not present

## 2022-05-30 MED ORDER — GADOBUTROL 1 MMOL/ML IV SOLN
7.5000 mL | Freq: Once | INTRAVENOUS | Status: AC | PRN
  Administered 2022-05-30: 7.5 mL via INTRAVENOUS

## 2022-05-31 ENCOUNTER — Inpatient Hospital Stay: Payer: Medicare HMO

## 2022-05-31 ENCOUNTER — Other Ambulatory Visit: Payer: Self-pay

## 2022-05-31 ENCOUNTER — Ambulatory Visit (HOSPITAL_COMMUNITY): Admission: RE | Admit: 2022-05-31 | Payer: Medicare HMO | Source: Ambulatory Visit

## 2022-05-31 DIAGNOSIS — C3431 Malignant neoplasm of lower lobe, right bronchus or lung: Secondary | ICD-10-CM | POA: Diagnosis not present

## 2022-06-01 ENCOUNTER — Other Ambulatory Visit: Payer: Self-pay | Admitting: *Deleted

## 2022-06-01 NOTE — Progress Notes (Signed)
The proposed treatment discussed in conference is for discussion purpose only and is not a binding recommendation.  The patients have not been physically examined, or presented with their treatment options.  Therefore, final treatment plans cannot be decided.  

## 2022-06-02 ENCOUNTER — Encounter (HOSPITAL_COMMUNITY): Payer: Self-pay

## 2022-06-02 MED FILL — Dexamethasone Sodium Phosphate Inj 100 MG/10ML: INTRAMUSCULAR | Qty: 1 | Status: AC

## 2022-06-05 ENCOUNTER — Inpatient Hospital Stay: Payer: Medicare HMO

## 2022-06-05 ENCOUNTER — Ambulatory Visit
Admission: RE | Admit: 2022-06-05 | Discharge: 2022-06-05 | Disposition: A | Discharge status: Home / Self Care | Payer: Medicare HMO | Source: Ambulatory Visit | Attending: Radiation Oncology | Admitting: Radiation Oncology

## 2022-06-05 ENCOUNTER — Inpatient Hospital Stay: Payer: Medicare HMO | Admitting: Internal Medicine

## 2022-06-05 ENCOUNTER — Other Ambulatory Visit: Payer: Self-pay

## 2022-06-05 VITALS — BP 147/56 | HR 88 | Temp 97.7°F | Wt 174.1 lb

## 2022-06-05 VITALS — BP 159/64 | HR 84 | Temp 98.8°F | Resp 16

## 2022-06-05 DIAGNOSIS — Z923 Personal history of irradiation: Secondary | ICD-10-CM | POA: Diagnosis not present

## 2022-06-05 DIAGNOSIS — Z510 Encounter for antineoplastic radiation therapy: Secondary | ICD-10-CM | POA: Diagnosis not present

## 2022-06-05 DIAGNOSIS — C3431 Malignant neoplasm of lower lobe, right bronchus or lung: Secondary | ICD-10-CM | POA: Diagnosis not present

## 2022-06-05 DIAGNOSIS — Z5111 Encounter for antineoplastic chemotherapy: Secondary | ICD-10-CM

## 2022-06-05 DIAGNOSIS — R1310 Dysphagia, unspecified: Secondary | ICD-10-CM | POA: Diagnosis not present

## 2022-06-05 DIAGNOSIS — R062 Wheezing: Secondary | ICD-10-CM | POA: Diagnosis not present

## 2022-06-05 DIAGNOSIS — Z87891 Personal history of nicotine dependence: Secondary | ICD-10-CM | POA: Diagnosis not present

## 2022-06-05 DIAGNOSIS — Z79899 Other long term (current) drug therapy: Secondary | ICD-10-CM | POA: Diagnosis not present

## 2022-06-05 LAB — RAD ONC ARIA SESSION SUMMARY
Course Elapsed Days: 0
Plan Fractions Treated to Date: 1
Plan Prescribed Dose Per Fraction: 2 Gy
Plan Total Fractions Prescribed: 33
Plan Total Prescribed Dose: 66 Gy
Reference Point Dosage Given to Date: 2 Gy
Reference Point Session Dosage Given: 2 Gy
Session Number: 1

## 2022-06-05 LAB — CMP (CANCER CENTER ONLY)
ALT: 13 U/L (ref 0–44)
AST: 18 U/L (ref 15–41)
Albumin: 4.2 g/dL (ref 3.5–5.0)
Alkaline Phosphatase: 94 U/L (ref 38–126)
Anion gap: 8 (ref 5–15)
BUN: 20 mg/dL (ref 8–23)
CO2: 26 mmol/L (ref 22–32)
Calcium: 9.5 mg/dL (ref 8.9–10.3)
Chloride: 107 mmol/L (ref 98–111)
Creatinine: 1.12 mg/dL — ABNORMAL HIGH (ref 0.44–1.00)
GFR, Estimated: 51 mL/min — ABNORMAL LOW (ref >60)
Glucose, Bld: 100 mg/dL — ABNORMAL HIGH (ref 70–99)
Potassium: 4.1 mmol/L (ref 3.5–5.1)
Sodium: 141 mmol/L (ref 135–145)
Total Bilirubin: 0.5 mg/dL (ref 0.3–1.2)
Total Protein: 7.6 g/dL (ref 6.5–8.1)

## 2022-06-05 LAB — CBC WITH DIFFERENTIAL (CANCER CENTER ONLY)
Abs Immature Granulocytes: 0.04 10*3/uL (ref 0.00–0.07)
Basophils Absolute: 0.1 10*3/uL (ref 0.0–0.1)
Basophils Relative: 1 %
Eosinophils Absolute: 0.2 10*3/uL (ref 0.0–0.5)
Eosinophils Relative: 2 %
HCT: 39.7 % (ref 36.0–46.0)
Hemoglobin: 12.8 g/dL (ref 12.0–15.0)
Immature Granulocytes: 0 %
Lymphocytes Relative: 16 %
Lymphs Abs: 1.6 10*3/uL (ref 0.7–4.0)
MCH: 27.3 pg (ref 26.0–34.0)
MCHC: 32.2 g/dL (ref 30.0–36.0)
MCV: 84.6 fL (ref 80.0–100.0)
Monocytes Absolute: 0.6 10*3/uL (ref 0.1–1.0)
Monocytes Relative: 6 %
Neutro Abs: 7.7 10*3/uL (ref 1.7–7.7)
Neutrophils Relative %: 75 %
Platelet Count: 300 10*3/uL (ref 150–400)
RBC: 4.69 MIL/uL (ref 3.87–5.11)
RDW: 15.4 % (ref 11.5–15.5)
WBC Count: 10.3 10*3/uL (ref 4.0–10.5)
nRBC: 0 % (ref 0.0–0.2)

## 2022-06-05 MED ORDER — PALONOSETRON HCL INJECTION 0.25 MG/5ML
0.2500 mg | Freq: Once | INTRAVENOUS | Status: AC
  Administered 2022-06-05: 0.25 mg via INTRAVENOUS
  Filled 2022-06-05: qty 5

## 2022-06-05 MED ORDER — FAMOTIDINE IN NACL 20-0.9 MG/50ML-% IV SOLN
20.0000 mg | Freq: Once | INTRAVENOUS | Status: AC
  Administered 2022-06-05: 20 mg via INTRAVENOUS
  Filled 2022-06-05: qty 50

## 2022-06-05 MED ORDER — DIPHENHYDRAMINE HCL 50 MG/ML IJ SOLN
50.0000 mg | Freq: Once | INTRAMUSCULAR | Status: AC
  Administered 2022-06-05: 50 mg via INTRAVENOUS
  Filled 2022-06-05: qty 1

## 2022-06-05 MED ORDER — SODIUM CHLORIDE 0.9 % IV SOLN
142.4000 mg | Freq: Once | INTRAVENOUS | Status: AC
  Administered 2022-06-05: 140 mg via INTRAVENOUS
  Filled 2022-06-05: qty 14

## 2022-06-05 MED ORDER — SODIUM CHLORIDE 0.9 % IV SOLN
10.0000 mg | Freq: Once | INTRAVENOUS | Status: AC
  Administered 2022-06-05: 10 mg via INTRAVENOUS
  Filled 2022-06-05: qty 10

## 2022-06-05 MED ORDER — SODIUM CHLORIDE 0.9 % IV SOLN
45.0000 mg/m2 | Freq: Once | INTRAVENOUS | Status: AC
  Administered 2022-06-05: 84 mg via INTRAVENOUS
  Filled 2022-06-05: qty 14

## 2022-06-05 MED ORDER — SODIUM CHLORIDE 0.9 % IV SOLN: Freq: Once | INTRAVENOUS | Status: AC

## 2022-06-05 NOTE — Patient Instructions (Signed)
[facility]  Discharge Instructions: Thank you for choosing [facility] to provide your oncology and hematology care.   If you have a lab appointment with the [facility], please go directly to the [facility] and check in at the registration area.   Wear comfortable clothing and clothing appropriate for easy access to any Portacath or PICC line.   We strive to give you quality time with your provider. You may need to reschedule your appointment if you arrive late (15 or more minutes).  Arriving late affects you and other patients whose appointments are after yours.  Also, if you miss three or more appointments without notifying the office, you may be dismissed from the clinic at the provider's discretion.      For prescription refill requests, have your pharmacy contact our office and allow 72 hours for refills to be completed.    Today you received the following chemotherapy and/or immunotherapy agents : Paclitaxel, Carboplatin      To help prevent nausea and vomiting after your treatment, we encourage you to take your nausea medication as directed.  BELOW ARE SYMPTOMS THAT SHOULD BE REPORTED IMMEDIATELY: *FEVER GREATER THAN 100.4 F (38 C) OR HIGHER *CHILLS OR SWEATING *NAUSEA AND VOMITING THAT IS NOT CONTROLLED WITH YOUR NAUSEA MEDICATION *UNUSUAL SHORTNESS OF BREATH *UNUSUAL BRUISING OR BLEEDING *URINARY PROBLEMS (pain or burning when urinating, or frequent urination) *BOWEL PROBLEMS (unusual diarrhea, constipation, pain near the anus) TENDERNESS IN MOUTH AND THROAT WITH OR WITHOUT PRESENCE OF ULCERS (sore throat, sores in mouth, or a toothache) UNUSUAL RASH, SWELLING OR PAIN  UNUSUAL VAGINAL DISCHARGE OR ITCHING   Items with * indicate a potential emergency and should be followed up as soon as possible or go to the Emergency Department if any problems should occur.  Please show the CHEMOTHERAPY ALERT CARD or IMMUNOTHERAPY ALERT CARD  at check-in to the Emergency Department and triage nurse.  Should you have questions after your visit or need to cancel or reschedule your appointment, please contact [facility]  Dept: [phone]  and follow the prompts.  Office hours are 8:00 a.m. to 4:30 p.m. Monday - Friday. Please note that voicemails left after 4:00 p.m. may not be returned until the following business day.  We are closed weekends and major holidays. You have access to a nurse at all times for urgent questions. Please call the main number to the clinic Dept: [phone] and follow the prompts.   For any non-urgent questions, you may also contact your provider using MyChart. We now offer e-Visits for anyone 58 and older to request care online for non-urgent symptoms. For details visit [email or web address].   Also download the MyChart app! Go to the app store, search "MyChart", open the app, select [facility], and log in with your MyChart username and password.  Masks are optional in the cancer centers. If you would like for your care team to wear a mask while they are taking care of you, please let them know. You may have one support person who is at least 78 years old accompany you for your appointments. Paclitaxel Injection What is this medication? PACLITAXEL (PAK li TAX el) treats some types of cancer. It works by slowing down the growth of cancer cells. This medicine may be used for other purposes; ask your health care provider or pharmacist if you have questions. COMMON BRAND NAME(S): Onxol, Taxol What should I tell my care team before I take  this medication? They need to know if you have any of these conditions: Heart disease Liver disease Low white blood cell levels An unusual or allergic reaction to paclitaxel, other medications, foods, dyes, or preservatives If you or your partner are pregnant or trying to get pregnant Breast-feeding How should I use this medication? This  medication is injected into a vein. It is given by your care team in a hospital or clinic setting. Talk to your care team about the use of this medication in children. While it may be given to children for selected conditions, precautions do apply. Overdosage: If you think you have taken too much of this medicine contact a poison control center or emergency room at once. NOTE: This medicine is only for you. Do not share this medicine with others. What if I miss a dose? Keep appointments for follow-up doses. It is important not to miss your dose. Call your care team if you are unable to keep an appointment. What may interact with this medication? Do not take this medication with any of the following: Live virus vaccines Other medications may affect the way this medication works. Talk with your care team about all of the medications you take. They may suggest changes to your treatment plan to lower the risk of side effects and to make sure your medications work as intended. This list may not describe all possible interactions. Give your health care provider a list of all the medicines, herbs, non-prescription drugs, or dietary supplements you use. Also tell them if you smoke, drink alcohol, or use illegal drugs. Some items may interact with your medicine. What should I watch for while using this medication? Your condition will be monitored carefully while you are receiving this medication. You may need blood work while taking this medication. This medication may make you feel generally unwell. This is not uncommon as chemotherapy can affect healthy cells as well as cancer cells. Report any side effects. Continue your course of treatment even though you feel ill unless your care team tells you to stop. This medication can cause serious allergic reactions. To reduce the risk, your care team may give you other medications to take before receiving this one. Be sure to follow the directions from your care  team. This medication may increase your risk of getting an infection. Call your care team for advice if you get a fever, chills, sore throat, or other symptoms of a cold or flu. Do not treat yourself. Try to avoid being around people who are sick. This medication may increase your risk to bruise or bleed. Call your care team if you notice any unusual bleeding. Be careful brushing or flossing your teeth or using a toothpick because you may get an infection or bleed more easily. If you have any dental work done, tell your dentist you are receiving this medication. Talk to your care team if you may be pregnant. Serious birth defects can occur if you take this medication during pregnancy. Talk to your care team before breastfeeding. Changes to your treatment plan may be needed. What side effects may I notice from receiving this medication? Side effects that you should report to your care team as soon as possible: Allergic reactions--skin rash, itching, hives, swelling of the face, lips, tongue, or throat Heart rhythm changes--fast or irregular heartbeat, dizziness, feeling faint or lightheaded, chest pain, trouble breathing Increase in blood pressure Infection--fever, chills, cough, sore throat, wounds that don't heal, pain or trouble when passing urine, general feeling  of discomfort or being unwell Low blood pressure--dizziness, feeling faint or lightheaded, blurry vision Low red blood cell level--unusual weakness or fatigue, dizziness, headache, trouble breathing Painful swelling, warmth, or redness of the skin, blisters or sores at the infusion site Pain, tingling, or numbness in the hands or feet Slow heartbeat--dizziness, feeling faint or lightheaded, confusion, trouble breathing, unusual weakness or fatigue Unusual bruising or bleeding Side effects that usually do not require medical attention (report to your care team if they continue or are bothersome): Diarrhea Hair loss Joint pain Loss of  appetite Muscle pain Nausea Vomiting This list may not describe all possible side effects. Call your doctor for medical advice about side effects. You may report side effects to FDA at 1-800-FDA-1088. Where should I keep my medication? This medication is given in a hospital or clinic. It will not be stored at home. NOTE: This sheet is a summary. It may not cover all possible information. If you have questions about this medicine, talk to your doctor, pharmacist, or health care provider.  2023 Elsevier/Gold Standard (2022-02-16 00:00:00) Carboplatin Injection What is this medication? CARBOPLATIN (KAR boe pla tin) treats some types of cancer. It works by slowing down the growth of cancer cells. This medicine may be used for other purposes; ask your health care provider or pharmacist if you have questions. COMMON BRAND NAME(S): Paraplatin What should I tell my care team before I take this medication? They need to know if you have any of these conditions: Blood disorders Hearing problems Kidney disease Recent or ongoing radiation therapy An unusual or allergic reaction to carboplatin, cisplatin, other medications, foods, dyes, or preservatives Pregnant or trying to get pregnant Breast-feeding How should I use this medication? This medication is injected into a vein. It is given by your care team in a hospital or clinic setting. Talk to your care team about the use of this medication in children. Special care may be needed. Overdosage: If you think you have taken too much of this medicine contact a poison control center or emergency room at once. NOTE: This medicine is only for you. Do not share this medicine with others. What if I miss a dose? Keep appointments for follow-up doses. It is important not to miss your dose. Call your care team if you are unable to keep an appointment. What may interact with this medication? Medications for seizures Some antibiotics, such as amikacin,  gentamicin, neomycin, streptomycin, tobramycin Vaccines This list may not describe all possible interactions. Give your health care provider a list of all the medicines, herbs, non-prescription drugs, or dietary supplements you use. Also tell them if you smoke, drink alcohol, or use illegal drugs. Some items may interact with your medicine. What should I watch for while using this medication? Your condition will be monitored carefully while you are receiving this medication. You may need blood work while taking this medication. This medication may make you feel generally unwell. This is not uncommon, as chemotherapy can affect healthy cells as well as cancer cells. Report any side effects. Continue your course of treatment even though you feel ill unless your care team tells you to stop. In some cases, you may be given additional medications to help with side effects. Follow all directions for their use. This medication may increase your risk of getting an infection. Call your care team for advice if you get a fever, chills, sore throat, or other symptoms of a cold or flu. Do not treat yourself. Try to avoid being  around people who are sick. Avoid taking medications that contain aspirin, acetaminophen, ibuprofen, naproxen, or ketoprofen unless instructed by your care team. These medications may hide a fever. Be careful brushing or flossing your teeth or using a toothpick because you may get an infection or bleed more easily. If you have any dental work done, tell your dentist you are receiving this medication. Talk to your care team if you wish to become pregnant or think you might be pregnant. This medication can cause serious birth defects. Talk to your care team about effective forms of contraception. Do not breast-feed while taking this medication. What side effects may I notice from receiving this medication? Side effects that you should report to your care team as soon as possible: Allergic  reactions--skin rash, itching, hives, swelling of the face, lips, tongue, or throat Infection--fever, chills, cough, sore throat, wounds that don't heal, pain or trouble when passing urine, general feeling of discomfort or being unwell Low red blood cell level--unusual weakness or fatigue, dizziness, headache, trouble breathing Pain, tingling, or numbness in the hands or feet, muscle weakness, change in vision, confusion or trouble speaking, loss of balance or coordination, trouble walking, seizures Unusual bruising or bleeding Side effects that usually do not require medical attention (report to your care team if they continue or are bothersome): Hair loss Nausea Unusual weakness or fatigue Vomiting This list may not describe all possible side effects. Call your doctor for medical advice about side effects. You may report side effects to FDA at 1-800-FDA-1088. Where should I keep my medication? This medication is given in a hospital or clinic. It will not be stored at home. NOTE: This sheet is a summary. It may not cover all possible information. If you have questions about this medicine, talk to your doctor, pharmacist, or health care provider.  2023 Elsevier/Gold Standard (2022-01-24 00:00:00)

## 2022-06-05 NOTE — Progress Notes (Signed)
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  She presented with a right lower lobe lung nodule.  She was diagnosed in June 2021.    PRIOR THERAPY: Right lower lobe superior segmentectomy and lymph node dissection under the care of Dr. Roxan Hockey on 03/17/2020.   CURRENT THERAPY: Concurrent chemoradiation with weekly carboplatin for AUC of 2 and paclitaxel 45 Mg/M2.  First dose 06/05/2022  INTERVAL HISTORY: Amy [name] 76 y.o. female returns to the clinic today for follow-up visit accompanied by her husband.  The patient is feeling fine today with no concerning complaints except for mild shortness of breath with exertion and some wheezing.  She denied having any chest pain, cough or hemoptysis.  She has no nausea, vomiting, diarrhea or constipation.  She has no headache or visual changes.  She denied having any recent weight loss or night sweats.  She had MRI of the brain that was unremarkable for brain metastasis.  The patient is here today for evaluation before starting the first cycle of her concurrent chemoradiation.   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  Status post right lower lobe superior segmentectomy    ALLERGIES:  has No Known Allergies.  MEDICATIONS:  Current Outpatient Medications  Medication Sig Dispense Refill   albuterol (VENTOLIN HFA) 108 (90 Base) MCG/ACT inhaler Inhale 2  puffs into the lungs every 6 (six) hours as needed for wheezing or shortness of breath.     ipratropium (ATROVENT) 0.06 % nasal spray Place 2 sprays into both nostrils daily as needed for rhinitis.     Multiple Vitamins-Minerals (MULTIVITAMIN ADULT) CHEW Chew 1 each by mouth daily.     omeprazole (PRILOSEC) 40 MG capsule Take 40 mg by mouth daily.     OVER THE COUNTER MEDICATION Take 1 capsule by mouth daily. Linseed oil + Omega 3 combination supplement     prochlorperazine (COMPAZINE) 10 MG tablet Take 1 tablet (10 mg total) by mouth every 6 (six) hours as needed for nausea or vomiting. 30 tablet 0   raloxifene (EVISTA) 60 MG tablet Take 60 mg by mouth daily.     sertraline (ZOLOFT) 50 MG tablet Take 50 mg by mouth daily.     simvastatin (ZOCOR) 40 MG tablet Take 40 mg by mouth every evening.      traZODone (DESYREL) 50 MG tablet Take 50 mg by mouth at bedtime as needed for sleep.     No current facility-administered medications for this visit.    SURGICAL HISTORY:  Past Surgical History:  Procedure Laterality Date   APPENDECTOMY     EYE SURGERY     INTERCOSTAL NERVE BLOCK Right 03/17/2020   Procedure: Intercostal Nerve Block;  Surgeon: [name], MD;  Location: [facility];  Service: Thoracic;  Laterality: Right;   LUNG SURGERY Right 03/17/2020   NODE DISSECTION Right  03/17/2020   Procedure: Node Dissection;  Surgeon: [name], MD;  Location: [facility];  Service: Thoracic;  Laterality: Right;   TUBAL LIGATION     VIDEO BRONCHOSCOPY WITH ENDOBRONCHIAL ULTRASOUND N/A 05/22/2022   Procedure: VIDEO BRONCHOSCOPY WITH ENDOBRONCHIAL ULTRASOUND;  Surgeon: [name], MD;  Location: [facility];  Service: Thoracic;  Laterality: N/A;    REVIEW OF SYSTEMS:  A comprehensive review of systems was negative except for: Respiratory: positive for dyspnea on exertion and wheezing   PHYSICAL EXAMINATION: General appearance: alert, cooperative, fatigued, and no distress Head:  Normocephalic, without obvious abnormality, atraumatic Neck: no adenopathy, no JVD, supple, symmetrical, trachea midline, and thyroid not enlarged, symmetric, no tenderness/mass/nodules Lymph nodes: Cervical, supraclavicular, and axillary nodes normal. Resp: wheezes RUL Back: symmetric, no curvature. ROM normal. No CVA tenderness. Cardio: regular rate and rhythm, S1, S2 normal, no murmur, click, rub or gallop GI: soft, non-tender; bowel sounds normal; no masses,  no organomegaly Extremities: extremities normal, atraumatic, no cyanosis or edema  ECOG PERFORMANCE STATUS: 1 - Symptomatic but completely ambulatory  Blood pressure (!) 147/56, pulse 88, temperature 97.7 F (36.5 C), temperature source Tympanic, weight 174 lb 1.6 oz (79 kg), SpO2 95 %.  LABORATORY DATA: Lab Results  Component Value Date   WBC 10.3 06/05/2022   HGB 12.8 06/05/2022   HCT 39.7 06/05/2022   MCV 84.6 06/05/2022   PLT 300 06/05/2022      Chemistry      Component Value Date/Time   NA 140 05/22/2022 1114   K 4.6 05/22/2022 1114   CL 105 05/22/2022 1114   CO2 27 05/22/2022 1114   BUN 21 05/22/2022 1114   CREATININE 1.25 (H) 05/22/2022 1114   CREATININE 1.15 (H) 04/17/2022 0833      Component Value Date/Time   CALCIUM 9.6 05/22/2022 1114   ALKPHOS 78 05/22/2022 1114   AST 22 05/22/2022 1114   AST 20 04/17/2022 0833   ALT 12 05/22/2022 1114   ALT 21 04/17/2022 0833   BILITOT 0.9 05/22/2022 1114   BILITOT 0.6 04/17/2022 0833       RADIOGRAPHIC STUDIES: MR BRAIN W WO CONTRAST  Result Date: 06/01/2022 CLINICAL DATA:  Non-small cell lung cancer (NSCLC), staging EXAM: MRI HEAD WITHOUT AND WITH CONTRAST TECHNIQUE: Multiplanar, multiecho pulse sequences of the brain and surrounding structures were obtained without and with intravenous contrast. CONTRAST:  7.45mL GADAVIST GADOBUTROL 1 MMOL/ML IV SOLN COMPARISON:  None Available. FINDINGS: Brain: There is no acute infarction or intracranial hemorrhage. There  is no intracranial mass, mass effect, or edema. There is no hydrocephalus or extra-axial fluid collection. Prominence of the ventricles and sulci reflects parenchymal volume loss. Confluent central pontine T2 hyperintensity is nonspecific but may reflect chronic microvascular ischemic changes along with a few scattered foci of T2 hyperintensity in the supratentorial white matter. No abnormal enhancement. Vascular: Major vessel flow voids at the skull base are preserved. Skull and upper cervical spine: Normal marrow signal is preserved. Sinuses/Orbits: Paranasal sinuses are aerated. Orbits are unremarkable. Other: Sella is unremarkable.  Mastoid air cells are clear. IMPRESSION: No evidence of intracranial metastatic disease. Electronically Signed   By: Macy Mis M.D.   On: 06/01/2022 15:27   DG Chest 2 View  Result Date: 05/22/2022 CLINICAL DATA:  [id number] preop chest exam; hilar mass EXAM: CHEST - 2 VIEW COMPARISON:  Correlation is made with a CT of the chest dated July 01/02/2022 in comparison with a chest x-ray dated April 27, 2020 FINDINGS: The heart  size and mediastinal contours are stable. Right infrahilar fullness corresponding to the mass seen in the recent CT of the chest. Blunting of the right CP angle of likely pleural thickening. No consolidation, pleural effusion or vascular congestion. Multiple old healed right rib fractures. IMPRESSION: No active cardiopulmonary disease. Fullness at the right infrahilar region corresponding to the infrahilar mass. Electronically Signed   By: Frazier Richards M.D.   On: 05/22/2022 10:54    ASSESSMENT AND PLAN: This is a very pleasant 76 years old white female with suspicious recurrent non-small cell lung cancer initially diagnosed as history of stage IA (T1 a, N0, M0) non-small cell lung cancer, squamous cell carcinoma presented with right lower lobe lung nodule diagnosed in June 2021 status post right lower lobe superior segmentectomy with lymph node dissection  under the care of Dr. Roxan Hockey on March 17, 2020.  The patient has evidence for disease recurrence in July 2023. The patient is feeling fine today with no concerning complaints except for the back pain. She had repeat CT scan of the chest performed recently.  I personally and independently reviewed the scan images and discussed the results with the patient today. Her scan showed mild increase in the right infrahilar soft tissue encasing the bronchus intermedius and disease recurrence could not be completely excluded.  There was also new mild bilateral lower paratracheal lymphadenopathy that is nonspecific and nodal metastasis could not be completely excluded. The patient had a PET scan performed recently and that showed intensely hypermetabolic recurrence in the right infrahilar region with mildly prominent left paratracheal and paraesophageal lymph nodes also mildly hypermetabolic for size and suspicious for nodal metastasis but no distant metastatic disease. The patient underwent repeat bronchoscopy with EBUS under the care of Dr. Roxan Hockey and the final pathology was consistent with recurrent squamous cell carcinoma of the lung.  MRI of the brain was negative for malignancy. She is currently on treatment with concurrent chemoradiation with weekly carboplatin for AUC of 2 and paclitaxel 45 Mg/M2.  First dose today June 05, 2022. I recommended for the patient to proceed with her treatment today as planned. I will see her back for follow-up visit in 2 weeks for evaluation before starting cycle #3. The patient was advised to call immediately if she has any other concerning symptoms in the interval.  The patient voices understanding of current disease status and treatment options and is in agreement with the current care plan.  All questions were answered. The patient knows to call the clinic with any problems, questions or concerns. We can certainly see the patient much sooner if necessary. The  total time spent in the appointment was 20 minutes.   Disclaimer: This note was dictated with voice recognition software. Similar sounding words can inadvertently be transcribed and may not be corrected upon review.

## 2022-06-06 ENCOUNTER — Other Ambulatory Visit: Payer: Self-pay

## 2022-06-06 ENCOUNTER — Encounter: Payer: Self-pay | Admitting: Internal Medicine

## 2022-06-06 ENCOUNTER — Ambulatory Visit
Admission: RE | Admit: 2022-06-06 | Discharge: 2022-06-06 | Disposition: A | Discharge status: Home / Self Care | Payer: Medicare HMO | Source: Ambulatory Visit | Attending: Radiation Oncology | Admitting: Radiation Oncology

## 2022-06-06 DIAGNOSIS — Z510 Encounter for antineoplastic radiation therapy: Secondary | ICD-10-CM | POA: Diagnosis not present

## 2022-06-06 DIAGNOSIS — C3431 Malignant neoplasm of lower lobe, right bronchus or lung: Secondary | ICD-10-CM | POA: Diagnosis not present

## 2022-06-06 DIAGNOSIS — Z87891 Personal history of nicotine dependence: Secondary | ICD-10-CM | POA: Diagnosis not present

## 2022-06-06 LAB — RAD ONC ARIA SESSION SUMMARY
Course Elapsed Days: 1
Plan Fractions Treated to Date: 2
Plan Prescribed Dose Per Fraction: 2 Gy
Plan Total Fractions Prescribed: 33
Plan Total Prescribed Dose: 66 Gy
Reference Point Dosage Given to Date: 4 Gy
Reference Point Session Dosage Given: 2 Gy
Session Number: 2

## 2022-06-06 NOTE — Progress Notes (Signed)
Patient called after receiving my card.  Called patient to introduce myself as Arboriculturist and to offer available resources.  Discussed one-time $1000 Radio broadcast assistant to assist with personal expenses while going through treatment. Provided verbal income limits should she decide to apply.  Patient had questions regarding information needed by Cancer Policy. Provided direct number to billing department to receive needed information such as amounts paid and itemized statements.  She was very Patent attorney.   She has my card for any additional financial questions or concerns.

## 2022-06-07 ENCOUNTER — Ambulatory Visit
Admission: RE | Admit: 2022-06-07 | Discharge: 2022-06-07 | Disposition: A | Discharge status: Home / Self Care | Payer: Medicare HMO | Source: Ambulatory Visit | Attending: Radiation Oncology | Admitting: Radiation Oncology

## 2022-06-07 ENCOUNTER — Other Ambulatory Visit: Payer: Self-pay

## 2022-06-07 ENCOUNTER — Encounter (HOSPITAL_COMMUNITY): Payer: Self-pay

## 2022-06-07 DIAGNOSIS — Z87891 Personal history of nicotine dependence: Secondary | ICD-10-CM | POA: Diagnosis not present

## 2022-06-07 DIAGNOSIS — Z510 Encounter for antineoplastic radiation therapy: Secondary | ICD-10-CM | POA: Diagnosis not present

## 2022-06-07 DIAGNOSIS — C3431 Malignant neoplasm of lower lobe, right bronchus or lung: Secondary | ICD-10-CM | POA: Diagnosis not present

## 2022-06-07 LAB — RAD ONC ARIA SESSION SUMMARY
Course Elapsed Days: 2
Plan Fractions Treated to Date: 3
Plan Prescribed Dose Per Fraction: 2 Gy
Plan Total Fractions Prescribed: 33
Plan Total Prescribed Dose: 66 Gy
Reference Point Dosage Given to Date: 6 Gy
Reference Point Session Dosage Given: 2 Gy
Session Number: 3

## 2022-06-08 ENCOUNTER — Other Ambulatory Visit: Payer: Self-pay

## 2022-06-08 ENCOUNTER — Ambulatory Visit
Admission: RE | Admit: 2022-06-08 | Discharge: 2022-06-08 | Disposition: A | Discharge status: Home / Self Care | Payer: Medicare HMO | Source: Ambulatory Visit | Attending: Radiation Oncology | Admitting: Radiation Oncology

## 2022-06-08 DIAGNOSIS — Z510 Encounter for antineoplastic radiation therapy: Secondary | ICD-10-CM | POA: Diagnosis not present

## 2022-06-08 DIAGNOSIS — Z87891 Personal history of nicotine dependence: Secondary | ICD-10-CM | POA: Diagnosis not present

## 2022-06-08 DIAGNOSIS — C3431 Malignant neoplasm of lower lobe, right bronchus or lung: Secondary | ICD-10-CM | POA: Diagnosis not present

## 2022-06-08 LAB — RAD ONC ARIA SESSION SUMMARY
Course Elapsed Days: 3
Plan Fractions Treated to Date: 4
Plan Prescribed Dose Per Fraction: 2 Gy
Plan Total Fractions Prescribed: 33
Plan Total Prescribed Dose: 66 Gy
Reference Point Dosage Given to Date: 8 Gy
Reference Point Session Dosage Given: 2 Gy
Session Number: 4

## 2022-06-09 ENCOUNTER — Ambulatory Visit
Admission: RE | Admit: 2022-06-09 | Discharge: 2022-06-09 | Disposition: A | Discharge status: Home / Self Care | Payer: Medicare HMO | Source: Ambulatory Visit | Attending: Radiation Oncology | Admitting: Radiation Oncology

## 2022-06-09 ENCOUNTER — Other Ambulatory Visit: Payer: Self-pay

## 2022-06-09 DIAGNOSIS — C3431 Malignant neoplasm of lower lobe, right bronchus or lung: Secondary | ICD-10-CM | POA: Diagnosis not present

## 2022-06-09 DIAGNOSIS — Z87891 Personal history of nicotine dependence: Secondary | ICD-10-CM | POA: Diagnosis not present

## 2022-06-09 DIAGNOSIS — Z510 Encounter for antineoplastic radiation therapy: Secondary | ICD-10-CM | POA: Diagnosis not present

## 2022-06-09 LAB — RAD ONC ARIA SESSION SUMMARY
Course Elapsed Days: 4
Plan Fractions Treated to Date: 5
Plan Prescribed Dose Per Fraction: 2 Gy
Plan Total Fractions Prescribed: 33
Plan Total Prescribed Dose: 66 Gy
Reference Point Dosage Given to Date: 10 Gy
Reference Point Session Dosage Given: 2 Gy
Session Number: 5

## 2022-06-09 MED FILL — Dexamethasone Sodium Phosphate Inj 100 MG/10ML: INTRAMUSCULAR | Qty: 1 | Status: AC

## 2022-06-12 ENCOUNTER — Inpatient Hospital Stay (HOSPITAL_BASED_OUTPATIENT_CLINIC_OR_DEPARTMENT_OTHER): Payer: Medicare HMO | Admitting: Internal Medicine

## 2022-06-12 ENCOUNTER — Inpatient Hospital Stay: Payer: Medicare HMO

## 2022-06-12 ENCOUNTER — Other Ambulatory Visit: Payer: Self-pay

## 2022-06-12 ENCOUNTER — Ambulatory Visit
Admission: RE | Admit: 2022-06-12 | Discharge: 2022-06-12 | Disposition: A | Discharge status: Home / Self Care | Payer: Medicare HMO | Source: Ambulatory Visit | Attending: Radiation Oncology | Admitting: Radiation Oncology

## 2022-06-12 ENCOUNTER — Inpatient Hospital Stay: Payer: Medicare HMO | Admitting: Internal Medicine

## 2022-06-12 DIAGNOSIS — C3431 Malignant neoplasm of lower lobe, right bronchus or lung: Secondary | ICD-10-CM

## 2022-06-12 DIAGNOSIS — Z923 Personal history of irradiation: Secondary | ICD-10-CM | POA: Diagnosis not present

## 2022-06-12 DIAGNOSIS — Z79899 Other long term (current) drug therapy: Secondary | ICD-10-CM | POA: Diagnosis not present

## 2022-06-12 DIAGNOSIS — R1310 Dysphagia, unspecified: Secondary | ICD-10-CM | POA: Diagnosis not present

## 2022-06-12 DIAGNOSIS — Z510 Encounter for antineoplastic radiation therapy: Secondary | ICD-10-CM | POA: Diagnosis not present

## 2022-06-12 DIAGNOSIS — Z87891 Personal history of nicotine dependence: Secondary | ICD-10-CM | POA: Diagnosis not present

## 2022-06-12 DIAGNOSIS — R062 Wheezing: Secondary | ICD-10-CM | POA: Diagnosis not present

## 2022-06-12 DIAGNOSIS — Z5111 Encounter for antineoplastic chemotherapy: Secondary | ICD-10-CM | POA: Diagnosis not present

## 2022-06-12 LAB — RAD ONC ARIA SESSION SUMMARY
Course Elapsed Days: 7
Plan Fractions Treated to Date: 6
Plan Prescribed Dose Per Fraction: 2 Gy
Plan Total Fractions Prescribed: 33
Plan Total Prescribed Dose: 66 Gy
Reference Point Dosage Given to Date: 12 Gy
Reference Point Session Dosage Given: 2 Gy
Session Number: 6

## 2022-06-12 LAB — CBC WITH DIFFERENTIAL (CANCER CENTER ONLY)
Abs Immature Granulocytes: 0.03 10*3/uL (ref 0.00–0.07)
Basophils Absolute: 0 10*3/uL (ref 0.0–0.1)
Basophils Relative: 1 %
Eosinophils Absolute: 0.2 10*3/uL (ref 0.0–0.5)
Eosinophils Relative: 4 %
HCT: 35.5 % — ABNORMAL LOW (ref 36.0–46.0)
Hemoglobin: 11.6 g/dL — ABNORMAL LOW (ref 12.0–15.0)
Immature Granulocytes: 1 %
Lymphocytes Relative: 12 %
Lymphs Abs: 0.7 10*3/uL (ref 0.7–4.0)
MCH: 27.7 pg (ref 26.0–34.0)
MCHC: 32.7 g/dL (ref 30.0–36.0)
MCV: 84.7 fL (ref 80.0–100.0)
Monocytes Absolute: 0.4 10*3/uL (ref 0.1–1.0)
Monocytes Relative: 6 %
Neutro Abs: 4.5 10*3/uL (ref 1.7–7.7)
Neutrophils Relative %: 76 %
Platelet Count: 272 10*3/uL (ref 150–400)
RBC: 4.19 MIL/uL (ref 3.87–5.11)
RDW: 14.7 % (ref 11.5–15.5)
WBC Count: 5.8 10*3/uL (ref 4.0–10.5)
nRBC: 0 % (ref 0.0–0.2)

## 2022-06-12 LAB — CMP (CANCER CENTER ONLY)
ALT: 11 U/L (ref 0–44)
AST: 16 U/L (ref 15–41)
Albumin: 4.1 g/dL (ref 3.5–5.0)
Alkaline Phosphatase: 86 U/L (ref 38–126)
Anion gap: 5 (ref 5–15)
BUN: 20 mg/dL (ref 8–23)
CO2: 29 mmol/L (ref 22–32)
Calcium: 9.6 mg/dL (ref 8.9–10.3)
Chloride: 103 mmol/L (ref 98–111)
Creatinine: 0.97 mg/dL (ref 0.44–1.00)
GFR, Estimated: >60 mL/min (ref >60)
Glucose, Bld: 118 mg/dL — ABNORMAL HIGH (ref 70–99)
Potassium: 4.4 mmol/L (ref 3.5–5.1)
Sodium: 137 mmol/L (ref 135–145)
Total Bilirubin: 0.6 mg/dL (ref 0.3–1.2)
Total Protein: 6.8 g/dL (ref 6.5–8.1)

## 2022-06-12 MED ORDER — FAMOTIDINE IN NACL 20-0.9 MG/50ML-% IV SOLN
20.0000 mg | Freq: Once | INTRAVENOUS | Status: AC
  Administered 2022-06-12: 20 mg via INTRAVENOUS
  Filled 2022-06-12: qty 50

## 2022-06-12 MED ORDER — DIPHENHYDRAMINE HCL 50 MG/ML IJ SOLN
50.0000 mg | Freq: Once | INTRAMUSCULAR | Status: AC
  Administered 2022-06-12: 50 mg via INTRAVENOUS
  Filled 2022-06-12: qty 1

## 2022-06-12 MED ORDER — SODIUM CHLORIDE 0.9 % IV SOLN
45.0000 mg/m2 | Freq: Once | INTRAVENOUS | Status: AC
  Administered 2022-06-12: 84 mg via INTRAVENOUS
  Filled 2022-06-12: qty 14

## 2022-06-12 MED ORDER — SODIUM CHLORIDE 0.9 % IV SOLN
10.0000 mg | Freq: Once | INTRAVENOUS | Status: AC
  Administered 2022-06-12: 10 mg via INTRAVENOUS
  Filled 2022-06-12: qty 10

## 2022-06-12 MED ORDER — SODIUM CHLORIDE 0.9 % IV SOLN: Freq: Once | INTRAVENOUS | Status: AC

## 2022-06-12 MED ORDER — PALONOSETRON HCL INJECTION 0.25 MG/5ML
0.2500 mg | Freq: Once | INTRAVENOUS | Status: AC
  Administered 2022-06-12: 0.25 mg via INTRAVENOUS
  Filled 2022-06-12: qty 5

## 2022-06-12 MED ORDER — SODIUM CHLORIDE 0.9 % IV SOLN
142.4000 mg | Freq: Once | INTRAVENOUS | Status: AC
  Administered 2022-06-12: 140 mg via INTRAVENOUS
  Filled 2022-06-12: qty 14

## 2022-06-12 NOTE — Patient Instructions (Signed)
[facility]  Discharge Instructions: Thank you for choosing [facility] to provide your oncology and hematology care.   If you have a lab appointment with the [facility], please go directly to the [facility] and check in at the registration area.   Wear comfortable clothing and clothing appropriate for easy access to any Portacath or PICC line.   We strive to give you quality time with your provider. You may need to reschedule your appointment if you arrive late (15 or more minutes).  Arriving late affects you and other patients whose appointments are after yours.  Also, if you miss three or more appointments without notifying the office, you may be dismissed from the clinic at the provider's discretion.      For prescription refill requests, have your pharmacy contact our office and allow 72 hours for refills to be completed.    Today you received the following chemotherapy and/or immunotherapy agents; Carbo & Taxol      To help prevent nausea and vomiting after your treatment, we encourage you to take your nausea medication as directed.  BELOW ARE SYMPTOMS THAT SHOULD BE REPORTED IMMEDIATELY: *FEVER GREATER THAN 100.4 F (38 C) OR HIGHER *CHILLS OR SWEATING *NAUSEA AND VOMITING THAT IS NOT CONTROLLED WITH YOUR NAUSEA MEDICATION *UNUSUAL SHORTNESS OF BREATH *UNUSUAL BRUISING OR BLEEDING *URINARY PROBLEMS (pain or burning when urinating, or frequent urination) *BOWEL PROBLEMS (unusual diarrhea, constipation, pain near the anus) TENDERNESS IN MOUTH AND THROAT WITH OR WITHOUT PRESENCE OF ULCERS (sore throat, sores in mouth, or a toothache) UNUSUAL RASH, SWELLING OR PAIN  UNUSUAL VAGINAL DISCHARGE OR ITCHING   Items with * indicate a potential emergency and should be followed up as soon as possible or go to the Emergency Department if any problems should occur.  Please show the CHEMOTHERAPY ALERT CARD or IMMUNOTHERAPY ALERT CARD at check-in  to the Emergency Department and triage nurse.  Should you have questions after your visit or need to cancel or reschedule your appointment, please contact [facility]  Dept: [phone]  and follow the prompts.  Office hours are 8:00 a.m. to 4:30 p.m. Monday - Friday. Please note that voicemails left after 4:00 p.m. may not be returned until the following business day.  We are closed weekends and major holidays. You have access to a nurse at all times for urgent questions. Please call the main number to the clinic Dept: [phone] and follow the prompts.   For any non-urgent questions, you may also contact your provider using MyChart. We now offer e-Visits for anyone 58 and older to request care online for non-urgent symptoms. For details visit [email or web address].   Also download the MyChart app! Go to the app store, search "MyChart", open the app, select [facility], and log in with your MyChart username and password.  Masks are optional in the cancer centers. If you would like for your care team to wear a mask while they are taking care of you, please let them know. You may have one support Parthiv Mucci who is at least 78 years old accompany you for your appointments.

## 2022-06-12 NOTE — Progress Notes (Signed)
The pharmacy team has substituted IV diphenhydramine for IV cetirizine as a premedication. Patient will be monitored for hypersensitivity reaction and adverse reactions to IV cetirizine. Thanks.   Larene Beach, PharmD

## 2022-06-12 NOTE — Progress Notes (Signed)
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  She presented with a right lower lobe lung nodule.  She was diagnosed in June 2021.    PRIOR THERAPY: Right lower lobe superior segmentectomy and lymph node dissection under the care of Dr. Roxan Hockey on 03/17/2020.   CURRENT THERAPY: Concurrent chemoradiation with weekly carboplatin for AUC of 2 and paclitaxel 45 Mg/M2.  First dose 06/05/2022.  Status post 1 cycle.  INTERVAL HISTORY: Amy [name] 76 y.o. female returns to the clinic today for follow-up visit accompanied by her husband.  The patient tolerated the first week of her treatment fairly well with no concerning adverse effects. She denied having any chest pain, shortness of breath, cough or hemoptysis.  She denied having any fever or chills.  She has no nausea, vomiting, diarrhea or constipation.  She has no headache or visual changes.  She is here today for evaluation before starting cycle #2.   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  Status post right lower lobe superior segmentectomy    ALLERGIES:  has No Known Allergies.  MEDICATIONS:  Current Outpatient Medications  Medication Sig Dispense Refill   albuterol (VENTOLIN HFA) 108 (90 Base) MCG/ACT inhaler Inhale 2 puffs into the lungs every 6 (six) hours as needed for wheezing or shortness of breath.     ipratropium (ATROVENT) 0.06 % nasal  spray Place 2 sprays into both nostrils daily as needed for rhinitis.     Multiple Vitamins-Minerals (MULTIVITAMIN ADULT) CHEW Chew 1 each by mouth daily.     omeprazole (PRILOSEC) 40 MG capsule Take 40 mg by mouth daily.     OVER THE COUNTER MEDICATION Take 1 capsule by mouth daily. Linseed oil + Omega 3 combination supplement     prochlorperazine (COMPAZINE) 10 MG tablet Take 1 tablet (10 mg total) by mouth every 6 (six) hours as needed for nausea or vomiting. 30 tablet 0   raloxifene (EVISTA) 60 MG tablet Take 60 mg by mouth daily.     sertraline (ZOLOFT) 50 MG tablet Take 50 mg by mouth daily.     simvastatin (ZOCOR) 40 MG tablet Take 40 mg by mouth every evening.      traZODone (DESYREL) 50 MG tablet Take 50 mg by mouth at bedtime as needed for sleep.     No current facility-administered medications for this visit.    SURGICAL HISTORY:  Past Surgical History:  Procedure Laterality Date   APPENDECTOMY     EYE SURGERY     INTERCOSTAL NERVE BLOCK Right 03/17/2020   Procedure: Intercostal Nerve Block;  Surgeon: [name], MD;  Location: [facility];  Service: Thoracic;  Laterality: Right;   LUNG SURGERY Right 03/17/2020   NODE DISSECTION Right 03/17/2020   Procedure: Node Dissection;  Surgeon: [name], MD;  Location: [facility];  Service: Thoracic;  Laterality: Right;   TUBAL LIGATION     VIDEO BRONCHOSCOPY WITH ENDOBRONCHIAL ULTRASOUND N/A 05/22/2022   Procedure: VIDEO BRONCHOSCOPY WITH ENDOBRONCHIAL ULTRASOUND;  Surgeon: [name], MD;  Location: [facility];  Service: Thoracic;  Laterality: N/A;    REVIEW OF SYSTEMS:  A comprehensive review of systems was negative.   PHYSICAL EXAMINATION: General appearance: alert, cooperative, and no distress Head: Normocephalic, without obvious abnormality, atraumatic Neck: no adenopathy, no JVD, supple, symmetrical, trachea midline, and thyroid not enlarged, symmetric, no tenderness/mass/nodules Lymph nodes: Cervical,  supraclavicular, and axillary nodes normal. Resp: clear to auscultation bilaterally Back: symmetric, no curvature. ROM normal. No CVA tenderness. Cardio: regular rate and rhythm, S1, S2 normal, no murmur, click, rub or gallop GI: soft, non-tender; bowel sounds normal; no masses,  no organomegaly Extremities: extremities normal, atraumatic, no cyanosis or edema  ECOG PERFORMANCE STATUS: 1 - Symptomatic but completely ambulatory  Blood pressure (!) 121/56, pulse 98, temperature 98.1 F (36.7 C), temperature source Oral, resp. rate 15, weight 172 lb 6.4 oz (78.2 kg), SpO2 97 %.  LABORATORY DATA: Lab Results  Component Value Date   WBC 5.8 06/12/2022   HGB 11.6 (L) 06/12/2022   HCT 35.5 (L) 06/12/2022   MCV 84.7 06/12/2022   PLT 272 06/12/2022      Chemistry      Component Value Date/Time   NA 141 06/05/2022 0805   K 4.1 06/05/2022 0805   CL 107 06/05/2022 0805   CO2 26 06/05/2022 0805   BUN 20 06/05/2022 0805   CREATININE 1.12 (H) 06/05/2022 0805      Component Value Date/Time   CALCIUM 9.5 06/05/2022 0805   ALKPHOS 94 06/05/2022 0805   AST 18 06/05/2022 0805   ALT 13 06/05/2022 0805   BILITOT 0.5 06/05/2022 0805       RADIOGRAPHIC STUDIES: MR BRAIN W WO CONTRAST  Result Date: 06/01/2022 CLINICAL DATA:  Non-small cell lung cancer (NSCLC), staging EXAM: MRI HEAD WITHOUT AND WITH CONTRAST TECHNIQUE: Multiplanar, multiecho pulse sequences of the brain and surrounding structures were obtained without and with intravenous contrast. CONTRAST:  7.45mL GADAVIST GADOBUTROL 1 MMOL/ML IV SOLN COMPARISON:  None Available. FINDINGS: Brain: There is no acute infarction or intracranial hemorrhage. There is no intracranial mass, mass effect, or edema. There is no hydrocephalus or extra-axial fluid collection. Prominence of the ventricles and sulci reflects parenchymal volume loss. Confluent central pontine T2 hyperintensity is nonspecific but may reflect chronic microvascular ischemic  changes along with a few scattered foci of T2 hyperintensity in the supratentorial white matter. No abnormal enhancement. Vascular: Major vessel flow voids at the skull base are preserved. Skull and upper cervical spine: Normal marrow signal is preserved. Sinuses/Orbits: Paranasal sinuses are aerated. Orbits are unremarkable. Other: Sella is unremarkable.  Mastoid air cells are clear. IMPRESSION: No evidence of intracranial metastatic disease. Electronically Signed   By: Macy Mis M.D.   On: 06/01/2022 15:27   DG Chest 2 View  Result Date: 05/22/2022 CLINICAL DATA:  [id number] preop chest exam; hilar mass EXAM: CHEST - 2 VIEW COMPARISON:  Correlation is made with a CT of the chest dated July 01/02/2022 in comparison with a chest x-ray dated April 27, 2020 FINDINGS: The heart size and mediastinal contours are stable. Right infrahilar fullness corresponding to the mass seen in the recent CT of the chest. Blunting of the right CP angle of likely pleural thickening. No consolidation, pleural effusion or vascular congestion. Multiple old healed right rib fractures. IMPRESSION: No active cardiopulmonary disease.  Fullness at the right infrahilar region corresponding to the infrahilar mass. Electronically Signed   By: Frazier Richards M.D.   On: 05/22/2022 10:54    ASSESSMENT AND PLAN: This is a very pleasant 76 years old white female with suspicious recurrent non-small cell lung cancer initially diagnosed as history of stage IA (T1 a, N0, M0) non-small cell lung cancer, squamous cell carcinoma presented with right lower lobe lung nodule diagnosed in June 2021 status post right lower lobe superior segmentectomy with lymph node dissection under the care of Dr. Roxan Hockey on March 17, 2020.  The patient has evidence for disease recurrence in July 2023. The patient is feeling fine today with no concerning complaints except for the back pain. She had repeat CT scan of the chest performed recently.  I personally and  independently reviewed the scan images and discussed the results with the patient today. Her scan showed mild increase in the right infrahilar soft tissue encasing the bronchus intermedius and disease recurrence could not be completely excluded.  There was also new mild bilateral lower paratracheal lymphadenopathy that is nonspecific and nodal metastasis could not be completely excluded. The patient had a PET scan performed recently and that showed intensely hypermetabolic recurrence in the right infrahilar region with mildly prominent left paratracheal and paraesophageal lymph nodes also mildly hypermetabolic for size and suspicious for nodal metastasis but no distant metastatic disease. The patient underwent repeat bronchoscopy with EBUS under the care of Dr. Roxan Hockey and the final pathology was consistent with recurrent squamous cell carcinoma of the lung.  MRI of the brain was negative for malignancy. She is currently on treatment with concurrent chemoradiation with weekly carboplatin for AUC of 2 and paclitaxel 45 Mg/M2.  First dose today June 05, 2022.  Status post 1 cycle. She tolerated the first week of her treatment fairly well. I recommended for the patient to proceed with cycle #2 today as planned. I will see her back for follow-up visit in 2 weeks for evaluation before starting cycle #4. The patient was advised to call immediately if she has any other concerning symptoms in the interval. The patient voices understanding of current disease status and treatment options and is in agreement with the current care plan.  All questions were answered. The patient knows to call the clinic with any problems, questions or concerns. We can certainly see the patient much sooner if necessary. The total time spent in the appointment was 20 minutes.   Disclaimer: This note was dictated with voice recognition software. Similar sounding words can inadvertently be transcribed and may not be corrected  upon review.

## 2022-06-13 ENCOUNTER — Ambulatory Visit
Admission: RE | Admit: 2022-06-13 | Discharge: 2022-06-13 | Disposition: A | Discharge status: Home / Self Care | Payer: Medicare HMO | Source: Ambulatory Visit | Attending: Radiation Oncology | Admitting: Radiation Oncology

## 2022-06-13 ENCOUNTER — Other Ambulatory Visit: Payer: Self-pay

## 2022-06-13 DIAGNOSIS — C3431 Malignant neoplasm of lower lobe, right bronchus or lung: Secondary | ICD-10-CM | POA: Diagnosis not present

## 2022-06-13 DIAGNOSIS — Z87891 Personal history of nicotine dependence: Secondary | ICD-10-CM | POA: Diagnosis not present

## 2022-06-13 DIAGNOSIS — Z510 Encounter for antineoplastic radiation therapy: Secondary | ICD-10-CM | POA: Diagnosis not present

## 2022-06-13 LAB — RAD ONC ARIA SESSION SUMMARY
Course Elapsed Days: 8
Plan Fractions Treated to Date: 7
Plan Prescribed Dose Per Fraction: 2 Gy
Plan Total Fractions Prescribed: 33
Plan Total Prescribed Dose: 66 Gy
Reference Point Dosage Given to Date: 14 Gy
Reference Point Session Dosage Given: 2 Gy
Session Number: 7

## 2022-06-14 ENCOUNTER — Ambulatory Visit
Admission: RE | Admit: 2022-06-14 | Discharge: 2022-06-14 | Disposition: A | Discharge status: Home / Self Care | Payer: Medicare HMO | Source: Ambulatory Visit | Attending: Radiation Oncology | Admitting: Radiation Oncology

## 2022-06-14 ENCOUNTER — Other Ambulatory Visit: Payer: Self-pay

## 2022-06-14 DIAGNOSIS — Z87891 Personal history of nicotine dependence: Secondary | ICD-10-CM | POA: Diagnosis not present

## 2022-06-14 DIAGNOSIS — C3431 Malignant neoplasm of lower lobe, right bronchus or lung: Secondary | ICD-10-CM | POA: Diagnosis not present

## 2022-06-14 DIAGNOSIS — Z510 Encounter for antineoplastic radiation therapy: Secondary | ICD-10-CM | POA: Diagnosis not present

## 2022-06-14 LAB — RAD ONC ARIA SESSION SUMMARY
Course Elapsed Days: 9
Plan Fractions Treated to Date: 8
Plan Prescribed Dose Per Fraction: 2 Gy
Plan Total Fractions Prescribed: 33
Plan Total Prescribed Dose: 66 Gy
Reference Point Dosage Given to Date: 16 Gy
Reference Point Session Dosage Given: 2 Gy
Session Number: 8

## 2022-06-15 ENCOUNTER — Ambulatory Visit
Admission: RE | Admit: 2022-06-15 | Discharge: 2022-06-15 | Disposition: A | Discharge status: Home / Self Care | Payer: Medicare HMO | Source: Ambulatory Visit | Attending: Radiation Oncology | Admitting: Radiation Oncology

## 2022-06-15 ENCOUNTER — Other Ambulatory Visit: Payer: Self-pay

## 2022-06-15 DIAGNOSIS — Z510 Encounter for antineoplastic radiation therapy: Secondary | ICD-10-CM | POA: Diagnosis not present

## 2022-06-15 DIAGNOSIS — C3431 Malignant neoplasm of lower lobe, right bronchus or lung: Secondary | ICD-10-CM | POA: Diagnosis not present

## 2022-06-15 DIAGNOSIS — Z87891 Personal history of nicotine dependence: Secondary | ICD-10-CM | POA: Diagnosis not present

## 2022-06-15 LAB — RAD ONC ARIA SESSION SUMMARY
Course Elapsed Days: 10
Plan Fractions Treated to Date: 9
Plan Prescribed Dose Per Fraction: 2 Gy
Plan Total Fractions Prescribed: 33
Plan Total Prescribed Dose: 66 Gy
Reference Point Dosage Given to Date: 18 Gy
Reference Point Session Dosage Given: 2 Gy
Session Number: 9

## 2022-06-16 ENCOUNTER — Other Ambulatory Visit: Payer: Self-pay

## 2022-06-16 ENCOUNTER — Ambulatory Visit
Admission: RE | Admit: 2022-06-16 | Discharge: 2022-06-16 | Disposition: A | Discharge status: Home / Self Care | Payer: Medicare HMO | Source: Ambulatory Visit | Attending: Radiation Oncology | Admitting: Radiation Oncology

## 2022-06-16 DIAGNOSIS — C3431 Malignant neoplasm of lower lobe, right bronchus or lung: Secondary | ICD-10-CM | POA: Insufficient documentation

## 2022-06-16 DIAGNOSIS — Z510 Encounter for antineoplastic radiation therapy: Secondary | ICD-10-CM | POA: Diagnosis not present

## 2022-06-16 DIAGNOSIS — Z87891 Personal history of nicotine dependence: Secondary | ICD-10-CM | POA: Diagnosis not present

## 2022-06-16 LAB — RAD ONC ARIA SESSION SUMMARY
Course Elapsed Days: 11
Plan Fractions Treated to Date: 10
Plan Prescribed Dose Per Fraction: 2 Gy
Plan Total Fractions Prescribed: 33
Plan Total Prescribed Dose: 66 Gy
Reference Point Dosage Given to Date: 20 Gy
Reference Point Session Dosage Given: 2 Gy
Session Number: 10

## 2022-06-16 MED FILL — Dexamethasone Sodium Phosphate Inj 100 MG/10ML: INTRAMUSCULAR | Qty: 1 | Status: AC

## 2022-06-20 ENCOUNTER — Inpatient Hospital Stay: Payer: Medicare HMO

## 2022-06-20 ENCOUNTER — Ambulatory Visit
Admission: RE | Admit: 2022-06-20 | Discharge: 2022-06-20 | Disposition: A | Discharge status: Home / Self Care | Payer: Medicare HMO | Source: Ambulatory Visit | Attending: Radiation Oncology | Admitting: Radiation Oncology

## 2022-06-20 ENCOUNTER — Other Ambulatory Visit: Payer: Self-pay | Admitting: Radiation Oncology

## 2022-06-20 ENCOUNTER — Other Ambulatory Visit: Payer: Self-pay

## 2022-06-20 ENCOUNTER — Other Ambulatory Visit: Payer: Self-pay | Admitting: Urology

## 2022-06-20 VITALS — BP 134/84 | HR 100 | Temp 98.0°F | Resp 18 | Wt 170.5 lb

## 2022-06-20 DIAGNOSIS — Z87891 Personal history of nicotine dependence: Secondary | ICD-10-CM | POA: Insufficient documentation

## 2022-06-20 DIAGNOSIS — Z510 Encounter for antineoplastic radiation therapy: Secondary | ICD-10-CM | POA: Diagnosis not present

## 2022-06-20 DIAGNOSIS — D701 Agranulocytosis secondary to cancer chemotherapy: Secondary | ICD-10-CM | POA: Insufficient documentation

## 2022-06-20 DIAGNOSIS — K5900 Constipation, unspecified: Secondary | ICD-10-CM | POA: Insufficient documentation

## 2022-06-20 DIAGNOSIS — R1310 Dysphagia, unspecified: Secondary | ICD-10-CM | POA: Insufficient documentation

## 2022-06-20 DIAGNOSIS — T451X5A Adverse effect of antineoplastic and immunosuppressive drugs, initial encounter: Secondary | ICD-10-CM | POA: Insufficient documentation

## 2022-06-20 DIAGNOSIS — E860 Dehydration: Secondary | ICD-10-CM | POA: Insufficient documentation

## 2022-06-20 DIAGNOSIS — R112 Nausea with vomiting, unspecified: Secondary | ICD-10-CM | POA: Insufficient documentation

## 2022-06-20 DIAGNOSIS — C3431 Malignant neoplasm of lower lobe, right bronchus or lung: Secondary | ICD-10-CM

## 2022-06-20 DIAGNOSIS — Z79899 Other long term (current) drug therapy: Secondary | ICD-10-CM | POA: Insufficient documentation

## 2022-06-20 DIAGNOSIS — I4891 Unspecified atrial fibrillation: Secondary | ICD-10-CM | POA: Insufficient documentation

## 2022-06-20 DIAGNOSIS — Z5111 Encounter for antineoplastic chemotherapy: Secondary | ICD-10-CM | POA: Insufficient documentation

## 2022-06-20 LAB — CBC WITH DIFFERENTIAL (CANCER CENTER ONLY)
Abs Immature Granulocytes: 0.02 10*3/uL (ref 0.00–0.07)
Basophils Absolute: 0 10*3/uL (ref 0.0–0.1)
Basophils Relative: 1 %
Eosinophils Absolute: 0 10*3/uL (ref 0.0–0.5)
Eosinophils Relative: 1 %
HCT: 34.9 % — ABNORMAL LOW (ref 36.0–46.0)
Hemoglobin: 11.5 g/dL — ABNORMAL LOW (ref 12.0–15.0)
Immature Granulocytes: 0 %
Lymphocytes Relative: 9 %
Lymphs Abs: 0.4 10*3/uL — ABNORMAL LOW (ref 0.7–4.0)
MCH: 28.4 pg (ref 26.0–34.0)
MCHC: 33 g/dL (ref 30.0–36.0)
MCV: 86.2 fL (ref 80.0–100.0)
Monocytes Absolute: 0.4 10*3/uL (ref 0.1–1.0)
Monocytes Relative: 9 %
Neutro Abs: 3.8 10*3/uL (ref 1.7–7.7)
Neutrophils Relative %: 80 %
Platelet Count: 233 10*3/uL (ref 150–400)
RBC: 4.05 MIL/uL (ref 3.87–5.11)
RDW: 15.4 % (ref 11.5–15.5)
WBC Count: 4.7 10*3/uL (ref 4.0–10.5)
nRBC: 0 % (ref 0.0–0.2)

## 2022-06-20 LAB — RAD ONC ARIA SESSION SUMMARY
Course Elapsed Days: 15
Plan Fractions Treated to Date: 11
Plan Prescribed Dose Per Fraction: 2 Gy
Plan Total Fractions Prescribed: 33
Plan Total Prescribed Dose: 66 Gy
Reference Point Dosage Given to Date: 22 Gy
Reference Point Session Dosage Given: 2 Gy
Session Number: 11

## 2022-06-20 LAB — CMP (CANCER CENTER ONLY)
ALT: 9 U/L (ref 0–44)
AST: 13 U/L — ABNORMAL LOW (ref 15–41)
Albumin: 3.9 g/dL (ref 3.5–5.0)
Alkaline Phosphatase: 75 U/L (ref 38–126)
Anion gap: 8 (ref 5–15)
BUN: 20 mg/dL (ref 8–23)
CO2: 27 mmol/L (ref 22–32)
Calcium: 9.4 mg/dL (ref 8.9–10.3)
Chloride: 103 mmol/L (ref 98–111)
Creatinine: 1.24 mg/dL — ABNORMAL HIGH (ref 0.44–1.00)
GFR, Estimated: 45 mL/min — ABNORMAL LOW (ref >60)
Glucose, Bld: 138 mg/dL — ABNORMAL HIGH (ref 70–99)
Potassium: 3.9 mmol/L (ref 3.5–5.1)
Sodium: 138 mmol/L (ref 135–145)
Total Bilirubin: 0.7 mg/dL (ref 0.3–1.2)
Total Protein: 6.7 g/dL (ref 6.5–8.1)

## 2022-06-20 MED ORDER — SODIUM CHLORIDE 0.9 % IV SOLN
10.0000 mg | Freq: Once | INTRAVENOUS | Status: AC
  Administered 2022-06-20: 10 mg via INTRAVENOUS
  Filled 2022-06-20: qty 10

## 2022-06-20 MED ORDER — SUCRALFATE 1 G PO TABS: 1.0000 g | ORAL_TABLET | Freq: Three times a day (TID) | ORAL | 2 refills | Status: DC

## 2022-06-20 MED ORDER — PALONOSETRON HCL INJECTION 0.25 MG/5ML
0.2500 mg | Freq: Once | INTRAVENOUS | Status: AC
  Administered 2022-06-20: 0.25 mg via INTRAVENOUS
  Filled 2022-06-20: qty 5

## 2022-06-20 MED ORDER — SODIUM CHLORIDE 0.9 % IV SOLN: Freq: Once | INTRAVENOUS | Status: AC

## 2022-06-20 MED ORDER — CETIRIZINE HCL 10 MG/ML IV SOLN
10.0000 mg | Freq: Once | INTRAVENOUS | Status: AC
  Administered 2022-06-20: 10 mg via INTRAVENOUS
  Filled 2022-06-20: qty 1

## 2022-06-20 MED ORDER — SODIUM CHLORIDE 0.9 % IV SOLN
142.4000 mg | Freq: Once | INTRAVENOUS | Status: AC
  Administered 2022-06-20: 140 mg via INTRAVENOUS
  Filled 2022-06-20: qty 14

## 2022-06-20 MED ORDER — FAMOTIDINE IN NACL 20-0.9 MG/50ML-% IV SOLN
20.0000 mg | Freq: Once | INTRAVENOUS | Status: AC
  Administered 2022-06-20: 20 mg via INTRAVENOUS
  Filled 2022-06-20: qty 50

## 2022-06-20 MED ORDER — SODIUM CHLORIDE 0.9 % IV SOLN
45.0000 mg/m2 | Freq: Once | INTRAVENOUS | Status: AC
  Administered 2022-06-20: 84 mg via INTRAVENOUS
  Filled 2022-06-20: qty 14

## 2022-06-20 NOTE — Progress Notes (Signed)
Pt requested switch to IV Certirizine b/c the Benadryl caused severe restlessness and she was concerned also about being able to drive herself home.  Orders already in for IV Certirizine-  Kennith Center, Pharm.D., CPP 06/20/2022@9 :19 AM

## 2022-06-20 NOTE — Patient Instructions (Signed)
[facility]  Discharge Instructions: Thank you for choosing [facility] to provide your oncology and hematology care.   If you have a lab appointment with the [facility], please go directly to the [facility] and check in at the registration area.   Wear comfortable clothing and clothing appropriate for easy access to any Portacath or PICC line.   We strive to give you quality time with your provider. You may need to reschedule your appointment if you arrive late (15 or more minutes).  Arriving late affects you and other patients whose appointments are after yours.  Also, if you miss three or more appointments without notifying the office, you may be dismissed from the clinic at the provider's discretion.      For prescription refill requests, have your pharmacy contact our office and allow 72 hours for refills to be completed.    Today you received the following chemotherapy and/or immunotherapy agents paclitaxel, carboplatin      To help prevent nausea and vomiting after your treatment, we encourage you to take your nausea medication as directed.  BELOW ARE SYMPTOMS THAT SHOULD BE REPORTED IMMEDIATELY: *FEVER GREATER THAN 100.4 F (38 C) OR HIGHER *CHILLS OR SWEATING *NAUSEA AND VOMITING THAT IS NOT CONTROLLED WITH YOUR NAUSEA MEDICATION *UNUSUAL SHORTNESS OF BREATH *UNUSUAL BRUISING OR BLEEDING *URINARY PROBLEMS (pain or burning when urinating, or frequent urination) *BOWEL PROBLEMS (unusual diarrhea, constipation, pain near the anus) TENDERNESS IN MOUTH AND THROAT WITH OR WITHOUT PRESENCE OF ULCERS (sore throat, sores in mouth, or a toothache) UNUSUAL RASH, SWELLING OR PAIN  UNUSUAL VAGINAL DISCHARGE OR ITCHING   Items with * indicate a potential emergency and should be followed up as soon as possible or go to the Emergency Department if any problems should occur.  Please show the CHEMOTHERAPY ALERT CARD or IMMUNOTHERAPY ALERT CARD at  check-in to the Emergency Department and triage nurse.  Should you have questions after your visit or need to cancel or reschedule your appointment, please contact [facility]  Dept: [phone]  and follow the prompts.  Office hours are 8:00 a.m. to 4:30 p.m. Monday - Friday. Please note that voicemails left after 4:00 p.m. may not be returned until the following business day.  We are closed weekends and major holidays. You have access to a nurse at all times for urgent questions. Please call the main number to the clinic Dept: [phone] and follow the prompts.   For any non-urgent questions, you may also contact your provider using MyChart. We now offer e-Visits for anyone 64 and older to request care online for non-urgent symptoms. For details visit [email or web address].   Also download the MyChart app! Go to the app store, search "MyChart", open the app, select [facility], and log in with your MyChart username and password.  Masks are optional in the cancer centers. If you would like for your care team to wear a mask while they are taking care of you, please let them know. You may have one support person who is at least 78 years old accompany you for your appointments.

## 2022-06-21 ENCOUNTER — Ambulatory Visit
Admission: RE | Admit: 2022-06-21 | Discharge: 2022-06-21 | Disposition: A | Discharge status: Home / Self Care | Payer: Medicare HMO | Source: Ambulatory Visit | Attending: Radiation Oncology | Admitting: Radiation Oncology

## 2022-06-21 ENCOUNTER — Other Ambulatory Visit: Payer: Self-pay

## 2022-06-21 DIAGNOSIS — Z87891 Personal history of nicotine dependence: Secondary | ICD-10-CM | POA: Diagnosis not present

## 2022-06-21 DIAGNOSIS — C3431 Malignant neoplasm of lower lobe, right bronchus or lung: Secondary | ICD-10-CM | POA: Diagnosis not present

## 2022-06-21 DIAGNOSIS — Z510 Encounter for antineoplastic radiation therapy: Secondary | ICD-10-CM | POA: Diagnosis not present

## 2022-06-21 LAB — RAD ONC ARIA SESSION SUMMARY
Course Elapsed Days: 16
Plan Fractions Treated to Date: 12
Plan Prescribed Dose Per Fraction: 2 Gy
Plan Total Fractions Prescribed: 33
Plan Total Prescribed Dose: 66 Gy
Reference Point Dosage Given to Date: 24 Gy
Reference Point Session Dosage Given: 2 Gy
Session Number: 12

## 2022-06-22 ENCOUNTER — Ambulatory Visit
Admission: RE | Admit: 2022-06-22 | Discharge: 2022-06-22 | Disposition: A | Discharge status: Home / Self Care | Payer: Medicare HMO | Source: Ambulatory Visit | Attending: Radiation Oncology | Admitting: Radiation Oncology

## 2022-06-22 ENCOUNTER — Other Ambulatory Visit: Payer: Self-pay

## 2022-06-22 DIAGNOSIS — C3431 Malignant neoplasm of lower lobe, right bronchus or lung: Secondary | ICD-10-CM | POA: Diagnosis not present

## 2022-06-22 DIAGNOSIS — Z510 Encounter for antineoplastic radiation therapy: Secondary | ICD-10-CM | POA: Diagnosis not present

## 2022-06-22 DIAGNOSIS — Z87891 Personal history of nicotine dependence: Secondary | ICD-10-CM | POA: Diagnosis not present

## 2022-06-22 LAB — RAD ONC ARIA SESSION SUMMARY
Course Elapsed Days: 17
Plan Fractions Treated to Date: 13
Plan Prescribed Dose Per Fraction: 2 Gy
Plan Total Fractions Prescribed: 33
Plan Total Prescribed Dose: 66 Gy
Reference Point Dosage Given to Date: 26 Gy
Reference Point Session Dosage Given: 2 Gy
Session Number: 13

## 2022-06-23 ENCOUNTER — Ambulatory Visit
Admission: RE | Admit: 2022-06-23 | Discharge: 2022-06-23 | Disposition: A | Discharge status: Home / Self Care | Payer: Medicare HMO | Source: Ambulatory Visit | Attending: Radiation Oncology | Admitting: Radiation Oncology

## 2022-06-23 ENCOUNTER — Other Ambulatory Visit: Payer: Self-pay

## 2022-06-23 DIAGNOSIS — C3431 Malignant neoplasm of lower lobe, right bronchus or lung: Secondary | ICD-10-CM | POA: Diagnosis not present

## 2022-06-23 DIAGNOSIS — Z510 Encounter for antineoplastic radiation therapy: Secondary | ICD-10-CM | POA: Diagnosis not present

## 2022-06-23 DIAGNOSIS — Z87891 Personal history of nicotine dependence: Secondary | ICD-10-CM | POA: Diagnosis not present

## 2022-06-23 LAB — RAD ONC ARIA SESSION SUMMARY
Course Elapsed Days: 18
Plan Fractions Treated to Date: 14
Plan Prescribed Dose Per Fraction: 2 Gy
Plan Total Fractions Prescribed: 33
Plan Total Prescribed Dose: 66 Gy
Reference Point Dosage Given to Date: 28 Gy
Reference Point Session Dosage Given: 2 Gy
Session Number: 14

## 2022-06-23 MED FILL — Dexamethasone Sodium Phosphate Inj 100 MG/10ML: INTRAMUSCULAR | Qty: 1 | Status: AC

## 2022-06-26 ENCOUNTER — Ambulatory Visit
Admission: RE | Admit: 2022-06-26 | Discharge: 2022-06-26 | Disposition: A | Discharge status: Home / Self Care | Payer: Medicare HMO | Source: Ambulatory Visit | Attending: Radiation Oncology | Admitting: Radiation Oncology

## 2022-06-26 ENCOUNTER — Inpatient Hospital Stay: Payer: Medicare HMO

## 2022-06-26 ENCOUNTER — Inpatient Hospital Stay: Payer: Medicare HMO | Admitting: Internal Medicine

## 2022-06-26 ENCOUNTER — Encounter: Payer: Self-pay | Admitting: Internal Medicine

## 2022-06-26 ENCOUNTER — Other Ambulatory Visit: Payer: Self-pay

## 2022-06-26 ENCOUNTER — Other Ambulatory Visit: Payer: Self-pay | Admitting: Internal Medicine

## 2022-06-26 VITALS — BP 139/64 | HR 66 | Resp 16

## 2022-06-26 VITALS — BP 130/59 | HR 105 | Temp 97.6°F | Resp 15 | Wt 166.1 lb

## 2022-06-26 DIAGNOSIS — Z510 Encounter for antineoplastic radiation therapy: Secondary | ICD-10-CM | POA: Diagnosis not present

## 2022-06-26 DIAGNOSIS — Z5111 Encounter for antineoplastic chemotherapy: Secondary | ICD-10-CM | POA: Diagnosis not present

## 2022-06-26 DIAGNOSIS — C3431 Malignant neoplasm of lower lobe, right bronchus or lung: Secondary | ICD-10-CM

## 2022-06-26 DIAGNOSIS — Z87891 Personal history of nicotine dependence: Secondary | ICD-10-CM | POA: Diagnosis not present

## 2022-06-26 LAB — CBC WITH DIFFERENTIAL (CANCER CENTER ONLY)
Abs Immature Granulocytes: 0.01 10*3/uL (ref 0.00–0.07)
Basophils Absolute: 0 10*3/uL (ref 0.0–0.1)
Basophils Relative: 1 %
Eosinophils Absolute: 0 10*3/uL (ref 0.0–0.5)
Eosinophils Relative: 1 %
HCT: 36.6 % (ref 36.0–46.0)
Hemoglobin: 11.8 g/dL — ABNORMAL LOW (ref 12.0–15.0)
Immature Granulocytes: 0 %
Lymphocytes Relative: 10 %
Lymphs Abs: 0.3 10*3/uL — ABNORMAL LOW (ref 0.7–4.0)
MCH: 28.2 pg (ref 26.0–34.0)
MCHC: 32.2 g/dL (ref 30.0–36.0)
MCV: 87.4 fL (ref 80.0–100.0)
Monocytes Absolute: 0.2 10*3/uL (ref 0.1–1.0)
Monocytes Relative: 7 %
Neutro Abs: 2.3 10*3/uL (ref 1.7–7.7)
Neutrophils Relative %: 81 %
Platelet Count: 257 10*3/uL (ref 150–400)
RBC: 4.19 MIL/uL (ref 3.87–5.11)
RDW: 15 % (ref 11.5–15.5)
WBC Count: 2.8 10*3/uL — ABNORMAL LOW (ref 4.0–10.5)
nRBC: 0 % (ref 0.0–0.2)

## 2022-06-26 LAB — RAD ONC ARIA SESSION SUMMARY
Course Elapsed Days: 21
Plan Fractions Treated to Date: 15
Plan Prescribed Dose Per Fraction: 2 Gy
Plan Total Fractions Prescribed: 33
Plan Total Prescribed Dose: 66 Gy
Reference Point Dosage Given to Date: 30 Gy
Reference Point Session Dosage Given: 2 Gy
Session Number: 15

## 2022-06-26 LAB — CMP (CANCER CENTER ONLY)
ALT: 12 U/L (ref 0–44)
AST: 15 U/L (ref 15–41)
Albumin: 4 g/dL (ref 3.5–5.0)
Alkaline Phosphatase: 70 U/L (ref 38–126)
Anion gap: 4 — ABNORMAL LOW (ref 5–15)
BUN: 19 mg/dL (ref 8–23)
CO2: 29 mmol/L (ref 22–32)
Calcium: 9.8 mg/dL (ref 8.9–10.3)
Chloride: 106 mmol/L (ref 98–111)
Creatinine: 1.12 mg/dL — ABNORMAL HIGH (ref 0.44–1.00)
GFR, Estimated: 51 mL/min — ABNORMAL LOW (ref >60)
Glucose, Bld: 125 mg/dL — ABNORMAL HIGH (ref 70–99)
Potassium: 4.6 mmol/L (ref 3.5–5.1)
Sodium: 139 mmol/L (ref 135–145)
Total Bilirubin: 0.7 mg/dL (ref 0.3–1.2)
Total Protein: 6.9 g/dL (ref 6.5–8.1)

## 2022-06-26 MED ORDER — CETIRIZINE HCL 10 MG/ML IV SOLN
10.0000 mg | Freq: Once | INTRAVENOUS | Status: AC
  Administered 2022-06-26: 10 mg via INTRAVENOUS
  Filled 2022-06-26: qty 1

## 2022-06-26 MED ORDER — SODIUM CHLORIDE 0.9 % IV SOLN: Freq: Once | INTRAVENOUS | Status: AC

## 2022-06-26 MED ORDER — HYDROCODONE BIT-HOMATROP MBR 5-1.5 MG/5ML PO SOLN: 5.0000 mL | Freq: Four times a day (QID) | ORAL | 0 refills | Status: DC | PRN

## 2022-06-26 MED ORDER — SODIUM CHLORIDE 0.9 % IV SOLN
142.4000 mg | Freq: Once | INTRAVENOUS | Status: AC
  Administered 2022-06-26: 140 mg via INTRAVENOUS
  Filled 2022-06-26: qty 14

## 2022-06-26 MED ORDER — FAMOTIDINE IN NACL 20-0.9 MG/50ML-% IV SOLN
20.0000 mg | Freq: Once | INTRAVENOUS | Status: AC
  Administered 2022-06-26: 20 mg via INTRAVENOUS
  Filled 2022-06-26: qty 50

## 2022-06-26 MED ORDER — SODIUM CHLORIDE 0.9 % IV SOLN
10.0000 mg | Freq: Once | INTRAVENOUS | Status: AC
  Administered 2022-06-26: 10 mg via INTRAVENOUS
  Filled 2022-06-26: qty 10

## 2022-06-26 MED ORDER — PALONOSETRON HCL INJECTION 0.25 MG/5ML
0.2500 mg | Freq: Once | INTRAVENOUS | Status: AC
  Administered 2022-06-26: 0.25 mg via INTRAVENOUS
  Filled 2022-06-26: qty 5

## 2022-06-26 MED ORDER — SODIUM CHLORIDE 0.9 % IV SOLN
45.0000 mg/m2 | Freq: Once | INTRAVENOUS | Status: AC
  Administered 2022-06-26: 84 mg via INTRAVENOUS
  Filled 2022-06-26: qty 14

## 2022-06-26 NOTE — Progress Notes (Signed)
[name]   [phone]  OFFICE PROGRESS NOTE  [name], MD [name] [address]  DIAGNOSIS: Recurrent lung cancer initially diagnosed as stage IA (T1, N0, M0) non-small cell lung cancer, squamous cell carcinoma.  She presented with a right lower lobe lung nodule.  She was diagnosed in June 2021.    PRIOR THERAPY: Right lower lobe superior segmentectomy and lymph node dissection under the care of Dr. Roxan Hockey on 03/17/2020.   CURRENT THERAPY: Concurrent chemoradiation with weekly carboplatin for AUC of 2 and paclitaxel 45 Mg/M2.  First dose 06/05/2022.  Status post 3 cycles.  INTERVAL HISTORY: Amy [name] 76 y.o. female returns to the clinic today for follow-up visit accompanied by her husband.  The patient is feeling fine today with no concerning complaints except for dry cough and constipation.  She also has some choking with swallowing.  She was tried on Carafate for her odynophagia but she could not tolerate it more than 3 days and she stopped it.  She is currently on omeprazole once daily.  She denied having any current shortness of breath or hemoptysis.  She has no fever or chills.  She lost few pounds since her last visit.  She is here today for evaluation before starting cycle #4 of her treatment.  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  Status post right lower lobe superior segmentectomy    ALLERGIES:  has No Known Allergies.  MEDICATIONS:  Current Outpatient Medications  Medication Sig Dispense Refill   albuterol (VENTOLIN HFA) 108 (90 Base) MCG/ACT  inhaler Inhale 2 puffs into the lungs every 6 (six) hours as needed for wheezing or shortness of breath.     ipratropium (ATROVENT) 0.06 % nasal spray Place 2 sprays into both nostrils daily as needed for rhinitis.     Multiple Vitamins-Minerals (MULTIVITAMIN ADULT) CHEW Chew 1 each by mouth daily.     omeprazole (PRILOSEC) 40 MG capsule Take 40 mg by mouth daily.     OVER THE COUNTER MEDICATION Take 1 capsule by mouth daily. Linseed oil + Omega 3 combination supplement     prochlorperazine (COMPAZINE) 10 MG tablet Take 1 tablet (10 mg total) by mouth every 6 (six) hours as needed for nausea or vomiting. 30 tablet 0   raloxifene (EVISTA) 60 MG tablet Take 60 mg by mouth daily.     sertraline (ZOLOFT) 50 MG tablet Take 50 mg by mouth daily.     simvastatin (ZOCOR) 40 MG tablet Take 40 mg by mouth every evening.      sucralfate (CARAFATE) 1 g tablet Take 1 tablet (1 g total) by mouth 4 (four) times daily -  with meals and at bedtime. 5 min before meals for radiation induced esophagitis 120 tablet 2   traZODone (DESYREL) 50 MG tablet Take 50 mg by mouth at bedtime as needed for sleep.     No current facility-administered medications for this visit.    SURGICAL HISTORY:  Past Surgical History:  Procedure Laterality Date   APPENDECTOMY  EYE SURGERY     INTERCOSTAL NERVE BLOCK Right 03/17/2020   Procedure: Intercostal Nerve Block;  Surgeon: [name], MD;  Location: [facility];  Service: Thoracic;  Laterality: Right;   LUNG SURGERY Right 03/17/2020   NODE DISSECTION Right 03/17/2020   Procedure: Node Dissection;  Surgeon: [name], MD;  Location: [facility];  Service: Thoracic;  Laterality: Right;   TUBAL LIGATION     VIDEO BRONCHOSCOPY WITH ENDOBRONCHIAL ULTRASOUND N/A 05/22/2022   Procedure: VIDEO BRONCHOSCOPY WITH ENDOBRONCHIAL ULTRASOUND;  Surgeon: [name], MD;  Location: [facility];  Service: Thoracic;  Laterality: N/A;    REVIEW OF SYSTEMS:  A comprehensive  review of systems was negative except for: Constitutional: positive for fatigue and weight loss Respiratory: positive for cough Gastrointestinal: positive for constipation and odynophagia   PHYSICAL EXAMINATION: General appearance: alert, cooperative, and no distress Head: Normocephalic, without obvious abnormality, atraumatic Neck: no adenopathy, no JVD, supple, symmetrical, trachea midline, and thyroid not enlarged, symmetric, no tenderness/mass/nodules Lymph nodes: Cervical, supraclavicular, and axillary nodes normal. Resp: clear to auscultation bilaterally Back: symmetric, no curvature. ROM normal. No CVA tenderness. Cardio: regular rate and rhythm, S1, S2 normal, no murmur, click, rub or gallop GI: soft, non-tender; bowel sounds normal; no masses,  no organomegaly Extremities: extremities normal, atraumatic, no cyanosis or edema  ECOG PERFORMANCE STATUS: 1 - Symptomatic but completely ambulatory  Blood pressure (!) 130/59, pulse (!) 105, temperature 97.6 F (36.4 C), temperature source Oral, resp. rate 15, weight 166 lb 1.6 oz (75.3 kg), SpO2 97 %.  LABORATORY DATA: Lab Results  Component Value Date   WBC 2.8 (L) 06/26/2022   HGB 11.8 (L) 06/26/2022   HCT 36.6 06/26/2022   MCV 87.4 06/26/2022   PLT 257 06/26/2022      Chemistry      Component Value Date/Time   NA 138 06/20/2022 0802   K 3.9 06/20/2022 0802   CL 103 06/20/2022 0802   CO2 27 06/20/2022 0802   BUN 20 06/20/2022 0802   CREATININE 1.24 (H) 06/20/2022 0802      Component Value Date/Time   CALCIUM 9.4 06/20/2022 0802   ALKPHOS 75 06/20/2022 0802   AST 13 (L) 06/20/2022 0802   ALT 9 06/20/2022 0802   BILITOT 0.7 06/20/2022 0802       RADIOGRAPHIC STUDIES: MR BRAIN W WO CONTRAST  Result Date: 06/01/2022 CLINICAL DATA:  Non-small cell lung cancer (NSCLC), staging EXAM: MRI HEAD WITHOUT AND WITH CONTRAST TECHNIQUE: Multiplanar, multiecho pulse sequences of the brain and surrounding structures were  obtained without and with intravenous contrast. CONTRAST:  7.32mL GADAVIST GADOBUTROL 1 MMOL/ML IV SOLN COMPARISON:  None Available. FINDINGS: Brain: There is no acute infarction or intracranial hemorrhage. There is no intracranial mass, mass effect, or edema. There is no hydrocephalus or extra-axial fluid collection. Prominence of the ventricles and sulci reflects parenchymal volume loss. Confluent central pontine T2 hyperintensity is nonspecific but may reflect chronic microvascular ischemic changes along with a few scattered foci of T2 hyperintensity in the supratentorial white matter. No abnormal enhancement. Vascular: Major vessel flow voids at the skull base are preserved. Skull and upper cervical spine: Normal marrow signal is preserved. Sinuses/Orbits: Paranasal sinuses are aerated. Orbits are unremarkable. Other: Sella is unremarkable.  Mastoid air cells are clear. IMPRESSION: No evidence of intracranial metastatic disease. Electronically Signed   By: Macy Mis M.D.   On: 06/01/2022 15:27    ASSESSMENT AND PLAN: This is a very pleasant 76 years old white  female with suspicious recurrent non-small cell lung cancer initially diagnosed as history of stage IA (T1 a, N0, M0) non-small cell lung cancer, squamous cell carcinoma presented with right lower lobe lung nodule diagnosed in June 2021 status post right lower lobe superior segmentectomy with lymph node dissection under the care of Dr. Roxan Hockey on March 17, 2020.  The patient has evidence for disease recurrence in July 2023. The patient is feeling fine today with no concerning complaints except for the back pain. She had repeat CT scan of the chest performed recently.  I personally and independently reviewed the scan images and discussed the results with the patient today. Her scan showed mild increase in the right infrahilar soft tissue encasing the bronchus intermedius and disease recurrence could not be completely excluded.  There was also new  mild bilateral lower paratracheal lymphadenopathy that is nonspecific and nodal metastasis could not be completely excluded. The patient had a PET scan performed recently and that showed intensely hypermetabolic recurrence in the right infrahilar region with mildly prominent left paratracheal and paraesophageal lymph nodes also mildly hypermetabolic for size and suspicious for nodal metastasis but no distant metastatic disease. The patient underwent repeat bronchoscopy with EBUS under the care of Dr. Roxan Hockey and the final pathology was consistent with recurrent squamous cell carcinoma of the lung.  MRI of the brain was negative for malignancy. She is currently on treatment with concurrent chemoradiation with weekly carboplatin for AUC of 2 and paclitaxel 45 Mg/M2.  First dose today June 05, 2022.  Status post 3 cycles. The patient has been tolerating this treatment well except for the recent odynophagia and dry cough.  She also has constipation. I recommended for the patient to proceed with cycle #4 today as planned. For the odynophagia I encouraged her to start taking Carafate and she will continue on Prilosec. For the constipation she was advised to start using MiraLAX and stool softener on a as needed basis. I will see the patient back for follow-up visit in 2 weeks for evaluation before starting cycle #6 of her treatment. The patient was advised to call immediately if she has any concerning symptoms in the interval. The patient voices understanding of current disease status and treatment options and is in agreement with the current care plan.  All questions were answered. The patient knows to call the clinic with any problems, questions or concerns. We can certainly see the patient much sooner if necessary. The total time spent in the appointment was 30 minutes.   Disclaimer: This note was dictated with voice recognition software. Similar sounding words can inadvertently be transcribed and  may not be corrected upon review.

## 2022-06-26 NOTE — Patient Instructions (Signed)
[facility]  Discharge Instructions: Thank you for choosing [facility] to provide your oncology and hematology care.   If you have a lab appointment with the [facility], please go directly to the [facility] and check in at the registration area.   Wear comfortable clothing and clothing appropriate for easy access to any Portacath or PICC line.   We strive to give you quality time with your provider. You may need to reschedule your appointment if you arrive late (15 or more minutes).  Arriving late affects you and other patients whose appointments are after yours.  Also, if you miss three or more appointments without notifying the office, you may be dismissed from the clinic at the provider's discretion.      For prescription refill requests, have your pharmacy contact our office and allow 72 hours for refills to be completed.    Today you received the following chemotherapy and/or immunotherapy agents: Taxol/Carboplatin     To help prevent nausea and vomiting after your treatment, we encourage you to take your nausea medication as directed.  BELOW ARE SYMPTOMS THAT SHOULD BE REPORTED IMMEDIATELY: *FEVER GREATER THAN 100.4 F (38 C) OR HIGHER *CHILLS OR SWEATING *NAUSEA AND VOMITING THAT IS NOT CONTROLLED WITH YOUR NAUSEA MEDICATION *UNUSUAL SHORTNESS OF BREATH *UNUSUAL BRUISING OR BLEEDING *URINARY PROBLEMS (pain or burning when urinating, or frequent urination) *BOWEL PROBLEMS (unusual diarrhea, constipation, pain near the anus) TENDERNESS IN MOUTH AND THROAT WITH OR WITHOUT PRESENCE OF ULCERS (sore throat, sores in mouth, or a toothache) UNUSUAL RASH, SWELLING OR PAIN  UNUSUAL VAGINAL DISCHARGE OR ITCHING   Items with * indicate a potential emergency and should be followed up as soon as possible or go to the Emergency Department if any problems should occur.  Please show the CHEMOTHERAPY ALERT CARD or IMMUNOTHERAPY ALERT CARD at  check-in to the Emergency Department and triage nurse.  Should you have questions after your visit or need to cancel or reschedule your appointment, please contact [facility]  Dept: [phone]  and follow the prompts.  Office hours are 8:00 a.m. to 4:30 p.m. Monday - Friday. Please note that voicemails left after 4:00 p.m. may not be returned until the following business day.  We are closed weekends and major holidays. You have access to a nurse at all times for urgent questions. Please call the main number to the clinic Dept: [phone] and follow the prompts.   For any non-urgent questions, you may also contact your provider using MyChart. We now offer e-Visits for anyone 65 and older to request care online for non-urgent symptoms. For details visit [email or web address].   Also download the MyChart app! Go to the app store, search "MyChart", open the app, select [facility], and log in with your MyChart username and password.  Masks are optional in the cancer centers. If you would like for your care team to wear a mask while they are taking care of you, please let them know. You may have one support person who is at least 78 years old accompany you for your appointments.

## 2022-06-26 NOTE — Progress Notes (Signed)
Per Dr Julien Nordmann , it is okay to treat pt today with Carboplatin and Taxol with heart rate of  105.

## 2022-06-27 ENCOUNTER — Ambulatory Visit
Admission: RE | Admit: 2022-06-27 | Discharge: 2022-06-27 | Disposition: A | Discharge status: Home / Self Care | Payer: Medicare HMO | Source: Ambulatory Visit | Attending: Radiation Oncology | Admitting: Radiation Oncology

## 2022-06-27 ENCOUNTER — Other Ambulatory Visit: Payer: Self-pay

## 2022-06-27 DIAGNOSIS — Z510 Encounter for antineoplastic radiation therapy: Secondary | ICD-10-CM | POA: Diagnosis not present

## 2022-06-27 DIAGNOSIS — C3431 Malignant neoplasm of lower lobe, right bronchus or lung: Secondary | ICD-10-CM | POA: Diagnosis not present

## 2022-06-27 DIAGNOSIS — Z87891 Personal history of nicotine dependence: Secondary | ICD-10-CM | POA: Diagnosis not present

## 2022-06-27 LAB — RAD ONC ARIA SESSION SUMMARY
Course Elapsed Days: 22
Plan Fractions Treated to Date: 16
Plan Prescribed Dose Per Fraction: 2 Gy
Plan Total Fractions Prescribed: 33
Plan Total Prescribed Dose: 66 Gy
Reference Point Dosage Given to Date: 32 Gy
Reference Point Session Dosage Given: 2 Gy
Session Number: 16

## 2022-06-28 ENCOUNTER — Other Ambulatory Visit: Payer: Self-pay

## 2022-06-28 ENCOUNTER — Ambulatory Visit
Admission: RE | Admit: 2022-06-28 | Discharge: 2022-06-28 | Disposition: A | Discharge status: Home / Self Care | Payer: Medicare HMO | Source: Ambulatory Visit | Attending: Radiation Oncology | Admitting: Radiation Oncology

## 2022-06-28 DIAGNOSIS — Z87891 Personal history of nicotine dependence: Secondary | ICD-10-CM | POA: Diagnosis not present

## 2022-06-28 DIAGNOSIS — C3431 Malignant neoplasm of lower lobe, right bronchus or lung: Secondary | ICD-10-CM | POA: Diagnosis not present

## 2022-06-28 DIAGNOSIS — Z510 Encounter for antineoplastic radiation therapy: Secondary | ICD-10-CM | POA: Diagnosis not present

## 2022-06-28 LAB — RAD ONC ARIA SESSION SUMMARY
Course Elapsed Days: 23
Plan Fractions Treated to Date: 17
Plan Prescribed Dose Per Fraction: 2 Gy
Plan Total Fractions Prescribed: 33
Plan Total Prescribed Dose: 66 Gy
Reference Point Dosage Given to Date: 34 Gy
Reference Point Session Dosage Given: 2 Gy
Session Number: 17

## 2022-06-29 ENCOUNTER — Ambulatory Visit
Admission: RE | Admit: 2022-06-29 | Discharge: 2022-06-29 | Disposition: A | Discharge status: Home / Self Care | Payer: Medicare HMO | Source: Ambulatory Visit | Attending: Radiation Oncology | Admitting: Radiation Oncology

## 2022-06-29 ENCOUNTER — Other Ambulatory Visit: Payer: Self-pay

## 2022-06-29 DIAGNOSIS — Z87891 Personal history of nicotine dependence: Secondary | ICD-10-CM | POA: Diagnosis not present

## 2022-06-29 DIAGNOSIS — Z510 Encounter for antineoplastic radiation therapy: Secondary | ICD-10-CM | POA: Diagnosis not present

## 2022-06-29 DIAGNOSIS — C3431 Malignant neoplasm of lower lobe, right bronchus or lung: Secondary | ICD-10-CM | POA: Diagnosis not present

## 2022-06-29 DIAGNOSIS — R300 Dysuria: Secondary | ICD-10-CM | POA: Diagnosis not present

## 2022-06-29 LAB — RAD ONC ARIA SESSION SUMMARY
Course Elapsed Days: 24
Plan Fractions Treated to Date: 18
Plan Prescribed Dose Per Fraction: 2 Gy
Plan Total Fractions Prescribed: 33
Plan Total Prescribed Dose: 66 Gy
Reference Point Dosage Given to Date: 36 Gy
Reference Point Session Dosage Given: 2 Gy
Session Number: 18

## 2022-06-30 ENCOUNTER — Ambulatory Visit
Admission: RE | Admit: 2022-06-30 | Discharge: 2022-06-30 | Disposition: A | Discharge status: Home / Self Care | Payer: Medicare HMO | Source: Ambulatory Visit | Attending: Radiation Oncology | Admitting: Radiation Oncology

## 2022-06-30 ENCOUNTER — Other Ambulatory Visit: Payer: Self-pay

## 2022-06-30 DIAGNOSIS — Z510 Encounter for antineoplastic radiation therapy: Secondary | ICD-10-CM | POA: Diagnosis not present

## 2022-06-30 DIAGNOSIS — Z87891 Personal history of nicotine dependence: Secondary | ICD-10-CM | POA: Diagnosis not present

## 2022-06-30 DIAGNOSIS — C3431 Malignant neoplasm of lower lobe, right bronchus or lung: Secondary | ICD-10-CM | POA: Diagnosis not present

## 2022-06-30 LAB — RAD ONC ARIA SESSION SUMMARY
Course Elapsed Days: 25
Plan Fractions Treated to Date: 19
Plan Prescribed Dose Per Fraction: 2 Gy
Plan Total Fractions Prescribed: 33
Plan Total Prescribed Dose: 66 Gy
Reference Point Dosage Given to Date: 38 Gy
Reference Point Session Dosage Given: 2 Gy
Session Number: 19

## 2022-06-30 MED FILL — Dexamethasone Sodium Phosphate Inj 100 MG/10ML: INTRAMUSCULAR | Qty: 1 | Status: AC

## 2022-07-03 ENCOUNTER — Other Ambulatory Visit: Payer: Self-pay

## 2022-07-03 ENCOUNTER — Ambulatory Visit
Admission: RE | Admit: 2022-07-03 | Discharge: 2022-07-03 | Disposition: A | Discharge status: Home / Self Care | Payer: Medicare HMO | Source: Ambulatory Visit | Attending: Radiation Oncology | Admitting: Radiation Oncology

## 2022-07-03 ENCOUNTER — Ambulatory Visit: Payer: Medicare HMO | Admitting: Internal Medicine

## 2022-07-03 ENCOUNTER — Other Ambulatory Visit: Payer: Medicare HMO

## 2022-07-03 ENCOUNTER — Ambulatory Visit: Payer: Medicare HMO

## 2022-07-03 ENCOUNTER — Inpatient Hospital Stay: Payer: Medicare HMO

## 2022-07-03 VITALS — BP 122/74 | HR 94 | Temp 97.8°F | Resp 16 | Ht 63.0 in | Wt 164.5 lb

## 2022-07-03 DIAGNOSIS — Z87891 Personal history of nicotine dependence: Secondary | ICD-10-CM | POA: Diagnosis not present

## 2022-07-03 DIAGNOSIS — C3431 Malignant neoplasm of lower lobe, right bronchus or lung: Secondary | ICD-10-CM

## 2022-07-03 DIAGNOSIS — Z510 Encounter for antineoplastic radiation therapy: Secondary | ICD-10-CM | POA: Diagnosis not present

## 2022-07-03 LAB — CMP (CANCER CENTER ONLY)
ALT: 16 U/L (ref 0–44)
AST: 20 U/L (ref 15–41)
Albumin: 3.8 g/dL (ref 3.5–5.0)
Alkaline Phosphatase: 64 U/L (ref 38–126)
Anion gap: 8 (ref 5–15)
BUN: 21 mg/dL (ref 8–23)
CO2: 23 mmol/L (ref 22–32)
Calcium: 9.4 mg/dL (ref 8.9–10.3)
Chloride: 108 mmol/L (ref 98–111)
Creatinine: 1.32 mg/dL — ABNORMAL HIGH (ref 0.44–1.00)
GFR, Estimated: 42 mL/min — ABNORMAL LOW (ref >60)
Glucose, Bld: 99 mg/dL (ref 70–99)
Potassium: 4 mmol/L (ref 3.5–5.1)
Sodium: 139 mmol/L (ref 135–145)
Total Bilirubin: 0.7 mg/dL (ref 0.3–1.2)
Total Protein: 6.7 g/dL (ref 6.5–8.1)

## 2022-07-03 LAB — CBC WITH DIFFERENTIAL (CANCER CENTER ONLY)
Abs Immature Granulocytes: 0.01 10*3/uL (ref 0.00–0.07)
Basophils Absolute: 0 10*3/uL (ref 0.0–0.1)
Basophils Relative: 1 %
Eosinophils Absolute: 0 10*3/uL (ref 0.0–0.5)
Eosinophils Relative: 1 %
HCT: 37 % (ref 36.0–46.0)
Hemoglobin: 12.1 g/dL (ref 12.0–15.0)
Immature Granulocytes: 1 %
Lymphocytes Relative: 12 %
Lymphs Abs: 0.2 10*3/uL — ABNORMAL LOW (ref 0.7–4.0)
MCH: 28.6 pg (ref 26.0–34.0)
MCHC: 32.7 g/dL (ref 30.0–36.0)
MCV: 87.5 fL (ref 80.0–100.0)
Monocytes Absolute: 0.1 10*3/uL (ref 0.1–1.0)
Monocytes Relative: 7 %
Neutro Abs: 1.6 10*3/uL — ABNORMAL LOW (ref 1.7–7.7)
Neutrophils Relative %: 78 %
Platelet Count: 192 10*3/uL (ref 150–400)
RBC: 4.23 MIL/uL (ref 3.87–5.11)
RDW: 16.1 % — ABNORMAL HIGH (ref 11.5–15.5)
WBC Count: 2 10*3/uL — ABNORMAL LOW (ref 4.0–10.5)
nRBC: 0 % (ref 0.0–0.2)

## 2022-07-03 LAB — RAD ONC ARIA SESSION SUMMARY
Course Elapsed Days: 28
Plan Fractions Treated to Date: 20
Plan Prescribed Dose Per Fraction: 2 Gy
Plan Total Fractions Prescribed: 33
Plan Total Prescribed Dose: 66 Gy
Reference Point Dosage Given to Date: 40 Gy
Reference Point Session Dosage Given: 2 Gy
Session Number: 20

## 2022-07-03 MED ORDER — SODIUM CHLORIDE 0.9 % IV SOLN
142.4000 mg | Freq: Once | INTRAVENOUS | Status: AC
  Administered 2022-07-03: 140 mg via INTRAVENOUS
  Filled 2022-07-03: qty 14

## 2022-07-03 MED ORDER — SODIUM CHLORIDE 0.9 % IV SOLN: Freq: Once | INTRAVENOUS | Status: AC

## 2022-07-03 MED ORDER — FAMOTIDINE IN NACL 20-0.9 MG/50ML-% IV SOLN
20.0000 mg | Freq: Once | INTRAVENOUS | Status: AC
  Administered 2022-07-03: 20 mg via INTRAVENOUS
  Filled 2022-07-03: qty 50

## 2022-07-03 MED ORDER — PALONOSETRON HCL INJECTION 0.25 MG/5ML
0.2500 mg | Freq: Once | INTRAVENOUS | Status: AC
  Administered 2022-07-03: 0.25 mg via INTRAVENOUS
  Filled 2022-07-03: qty 5

## 2022-07-03 MED ORDER — SODIUM CHLORIDE 0.9 % IV SOLN
10.0000 mg | Freq: Once | INTRAVENOUS | Status: AC
  Administered 2022-07-03: 10 mg via INTRAVENOUS
  Filled 2022-07-03: qty 10

## 2022-07-03 MED ORDER — CETIRIZINE HCL 10 MG/ML IV SOLN
10.0000 mg | Freq: Once | INTRAVENOUS | Status: AC
  Administered 2022-07-03: 10 mg via INTRAVENOUS
  Filled 2022-07-03: qty 1

## 2022-07-03 MED ORDER — SODIUM CHLORIDE 0.9 % IV SOLN
45.0000 mg/m2 | Freq: Once | INTRAVENOUS | Status: AC
  Administered 2022-07-03: 84 mg via INTRAVENOUS
  Filled 2022-07-03: qty 14

## 2022-07-03 NOTE — Patient Instructions (Signed)
[facility]   Discharge Instructions: Thank you for choosing [facility] to provide your oncology and hematology care.   If you have a lab appointment with the [facility], please go directly to the [facility] and check in at the registration area.   Wear comfortable clothing and clothing appropriate for easy access to any Portacath or PICC line.   We strive to give you quality time with your provider. You may need to reschedule your appointment if you arrive late (15 or more minutes).  Arriving late affects you and other patients whose appointments are after yours.  Also, if you miss three or more appointments without notifying the office, you may be dismissed from the clinic at the provider's discretion.      For prescription refill requests, have your pharmacy contact our office and allow 72 hours for refills to be completed.    Today you received the following chemotherapy and/or immunotherapy agents: paclitaxel and carboplatin      To help prevent nausea and vomiting after your treatment, we encourage you to take your nausea medication as directed.  BELOW ARE SYMPTOMS THAT SHOULD BE REPORTED IMMEDIATELY: *FEVER GREATER THAN 100.4 F (38 C) OR HIGHER *CHILLS OR SWEATING *NAUSEA AND VOMITING THAT IS NOT CONTROLLED WITH YOUR NAUSEA MEDICATION *UNUSUAL SHORTNESS OF BREATH *UNUSUAL BRUISING OR BLEEDING *URINARY PROBLEMS (pain or burning when urinating, or frequent urination) *BOWEL PROBLEMS (unusual diarrhea, constipation, pain near the anus) TENDERNESS IN MOUTH AND THROAT WITH OR WITHOUT PRESENCE OF ULCERS (sore throat, sores in mouth, or a toothache) UNUSUAL RASH, SWELLING OR PAIN  UNUSUAL VAGINAL DISCHARGE OR ITCHING   Items with * indicate a potential emergency and should be followed up as soon as possible or go to the Emergency Department if any problems should occur.  Please show the CHEMOTHERAPY ALERT CARD or IMMUNOTHERAPY ALERT  CARD at check-in to the Emergency Department and triage nurse.  Should you have questions after your visit or need to cancel or reschedule your appointment, please contact [facility]  Dept: [phone]  and follow the prompts.  Office hours are 8:00 a.m. to 4:30 p.m. Monday - Friday. Please note that voicemails left after 4:00 p.m. may not be returned until the following business day.  We are closed weekends and major holidays. You have access to a nurse at all times for urgent questions. Please call the main number to the clinic Dept: [phone] and follow the prompts.   For any non-urgent questions, you may also contact your provider using MyChart. We now offer e-Visits for anyone 74 and older to request care online for non-urgent symptoms. For details visit [email or web address].   Also download the MyChart app! Go to the app store, search "MyChart", open the app, select [facility], and log in with your MyChart username and password.  Masks are optional in the cancer centers. If you would like for your care team to wear a mask while they are taking care of you, please let them know. You may have one support person who is at least 78 years old accompany you for your appointments.

## 2022-07-04 ENCOUNTER — Other Ambulatory Visit: Payer: Self-pay

## 2022-07-04 ENCOUNTER — Ambulatory Visit
Admission: RE | Admit: 2022-07-04 | Discharge: 2022-07-04 | Disposition: A | Discharge status: Home / Self Care | Payer: Medicare HMO | Source: Ambulatory Visit | Attending: Radiation Oncology | Admitting: Radiation Oncology

## 2022-07-04 DIAGNOSIS — Z510 Encounter for antineoplastic radiation therapy: Secondary | ICD-10-CM | POA: Diagnosis not present

## 2022-07-04 DIAGNOSIS — Z87891 Personal history of nicotine dependence: Secondary | ICD-10-CM | POA: Diagnosis not present

## 2022-07-04 DIAGNOSIS — C3431 Malignant neoplasm of lower lobe, right bronchus or lung: Secondary | ICD-10-CM | POA: Diagnosis not present

## 2022-07-04 LAB — RAD ONC ARIA SESSION SUMMARY
Course Elapsed Days: 29
Plan Fractions Treated to Date: 21
Plan Prescribed Dose Per Fraction: 2 Gy
Plan Total Fractions Prescribed: 33
Plan Total Prescribed Dose: 66 Gy
Reference Point Dosage Given to Date: 42 Gy
Reference Point Session Dosage Given: 2 Gy
Session Number: 21

## 2022-07-05 ENCOUNTER — Other Ambulatory Visit: Payer: Self-pay

## 2022-07-05 ENCOUNTER — Ambulatory Visit
Admission: RE | Admit: 2022-07-05 | Discharge: 2022-07-05 | Disposition: A | Discharge status: Home / Self Care | Payer: Medicare HMO | Source: Ambulatory Visit | Attending: Radiation Oncology | Admitting: Radiation Oncology

## 2022-07-05 DIAGNOSIS — Z87891 Personal history of nicotine dependence: Secondary | ICD-10-CM | POA: Diagnosis not present

## 2022-07-05 DIAGNOSIS — C3431 Malignant neoplasm of lower lobe, right bronchus or lung: Secondary | ICD-10-CM | POA: Diagnosis not present

## 2022-07-05 DIAGNOSIS — Z510 Encounter for antineoplastic radiation therapy: Secondary | ICD-10-CM | POA: Diagnosis not present

## 2022-07-05 LAB — RAD ONC ARIA SESSION SUMMARY
Course Elapsed Days: 30
Plan Fractions Treated to Date: 22
Plan Prescribed Dose Per Fraction: 2 Gy
Plan Total Fractions Prescribed: 33
Plan Total Prescribed Dose: 66 Gy
Reference Point Dosage Given to Date: 44 Gy
Reference Point Session Dosage Given: 2 Gy
Session Number: 22

## 2022-07-06 ENCOUNTER — Ambulatory Visit
Admission: RE | Admit: 2022-07-06 | Discharge: 2022-07-06 | Disposition: A | Discharge status: Home / Self Care | Payer: Medicare HMO | Source: Ambulatory Visit | Attending: Radiation Oncology | Admitting: Radiation Oncology

## 2022-07-06 ENCOUNTER — Other Ambulatory Visit: Payer: Self-pay

## 2022-07-06 DIAGNOSIS — C3431 Malignant neoplasm of lower lobe, right bronchus or lung: Secondary | ICD-10-CM | POA: Diagnosis not present

## 2022-07-06 DIAGNOSIS — Z510 Encounter for antineoplastic radiation therapy: Secondary | ICD-10-CM | POA: Diagnosis not present

## 2022-07-06 DIAGNOSIS — Z87891 Personal history of nicotine dependence: Secondary | ICD-10-CM | POA: Diagnosis not present

## 2022-07-06 LAB — RAD ONC ARIA SESSION SUMMARY
Course Elapsed Days: 31
Plan Fractions Treated to Date: 23
Plan Prescribed Dose Per Fraction: 2 Gy
Plan Total Fractions Prescribed: 33
Plan Total Prescribed Dose: 66 Gy
Reference Point Dosage Given to Date: 46 Gy
Reference Point Session Dosage Given: 2 Gy
Session Number: 23

## 2022-07-07 ENCOUNTER — Ambulatory Visit
Admission: RE | Admit: 2022-07-07 | Discharge: 2022-07-07 | Disposition: A | Discharge status: Home / Self Care | Payer: Medicare HMO | Source: Ambulatory Visit | Attending: Radiation Oncology | Admitting: Radiation Oncology

## 2022-07-07 ENCOUNTER — Other Ambulatory Visit: Payer: Self-pay

## 2022-07-07 DIAGNOSIS — C3431 Malignant neoplasm of lower lobe, right bronchus or lung: Secondary | ICD-10-CM | POA: Diagnosis not present

## 2022-07-07 DIAGNOSIS — Z87891 Personal history of nicotine dependence: Secondary | ICD-10-CM | POA: Diagnosis not present

## 2022-07-07 DIAGNOSIS — Z510 Encounter for antineoplastic radiation therapy: Secondary | ICD-10-CM | POA: Diagnosis not present

## 2022-07-07 LAB — RAD ONC ARIA SESSION SUMMARY
Course Elapsed Days: 32
Plan Fractions Treated to Date: 24
Plan Prescribed Dose Per Fraction: 2 Gy
Plan Total Fractions Prescribed: 33
Plan Total Prescribed Dose: 66 Gy
Reference Point Dosage Given to Date: 48 Gy
Reference Point Session Dosage Given: 2 Gy
Session Number: 24

## 2022-07-07 MED FILL — Dexamethasone Sodium Phosphate Inj 100 MG/10ML: INTRAMUSCULAR | Qty: 1 | Status: AC

## 2022-07-10 ENCOUNTER — Other Ambulatory Visit: Payer: Medicare HMO

## 2022-07-10 ENCOUNTER — Inpatient Hospital Stay: Payer: Medicare HMO

## 2022-07-10 ENCOUNTER — Inpatient Hospital Stay: Payer: Medicare HMO | Admitting: Internal Medicine

## 2022-07-10 ENCOUNTER — Other Ambulatory Visit: Payer: Self-pay

## 2022-07-10 ENCOUNTER — Ambulatory Visit: Payer: Medicare HMO | Admitting: Internal Medicine

## 2022-07-10 ENCOUNTER — Other Ambulatory Visit: Payer: Self-pay | Admitting: *Deleted

## 2022-07-10 ENCOUNTER — Ambulatory Visit: Payer: Medicare HMO

## 2022-07-10 ENCOUNTER — Ambulatory Visit
Admission: RE | Admit: 2022-07-10 | Discharge: 2022-07-10 | Disposition: A | Discharge status: Home / Self Care | Payer: Medicare HMO | Source: Ambulatory Visit | Attending: Radiation Oncology | Admitting: Radiation Oncology

## 2022-07-10 VITALS — BP 115/55 | HR 95 | Resp 17 | Wt 162.6 lb

## 2022-07-10 VITALS — BP 144/76 | HR 91 | Temp 97.8°F | Resp 18

## 2022-07-10 DIAGNOSIS — C3431 Malignant neoplasm of lower lobe, right bronchus or lung: Secondary | ICD-10-CM

## 2022-07-10 DIAGNOSIS — Z5111 Encounter for antineoplastic chemotherapy: Secondary | ICD-10-CM

## 2022-07-10 DIAGNOSIS — Z510 Encounter for antineoplastic radiation therapy: Secondary | ICD-10-CM | POA: Diagnosis not present

## 2022-07-10 DIAGNOSIS — Z87891 Personal history of nicotine dependence: Secondary | ICD-10-CM | POA: Diagnosis not present

## 2022-07-10 DIAGNOSIS — C3490 Malignant neoplasm of unspecified part of unspecified bronchus or lung: Secondary | ICD-10-CM | POA: Diagnosis not present

## 2022-07-10 LAB — RAD ONC ARIA SESSION SUMMARY
Course Elapsed Days: 35
Plan Fractions Treated to Date: 25
Plan Prescribed Dose Per Fraction: 2 Gy
Plan Total Fractions Prescribed: 33
Plan Total Prescribed Dose: 66 Gy
Reference Point Dosage Given to Date: 50 Gy
Reference Point Session Dosage Given: 2 Gy
Session Number: 25

## 2022-07-10 LAB — CBC WITH DIFFERENTIAL (CANCER CENTER ONLY)
Abs Immature Granulocytes: 0.01 10*3/uL (ref 0.00–0.07)
Basophils Absolute: 0 10*3/uL (ref 0.0–0.1)
Basophils Relative: 1 %
Eosinophils Absolute: 0 10*3/uL (ref 0.0–0.5)
Eosinophils Relative: 1 %
HCT: 33.7 % — ABNORMAL LOW (ref 36.0–46.0)
Hemoglobin: 11.6 g/dL — ABNORMAL LOW (ref 12.0–15.0)
Immature Granulocytes: 1 %
Lymphocytes Relative: 11 %
Lymphs Abs: 0.2 10*3/uL — ABNORMAL LOW (ref 0.7–4.0)
MCH: 29.9 pg (ref 26.0–34.0)
MCHC: 34.4 g/dL (ref 30.0–36.0)
MCV: 86.9 fL (ref 80.0–100.0)
Monocytes Absolute: 0.2 10*3/uL (ref 0.1–1.0)
Monocytes Relative: 9 %
Neutro Abs: 1.4 10*3/uL — ABNORMAL LOW (ref 1.7–7.7)
Neutrophils Relative %: 77 %
Platelet Count: 169 10*3/uL (ref 150–400)
RBC: 3.88 MIL/uL (ref 3.87–5.11)
RDW: 16.1 % — ABNORMAL HIGH (ref 11.5–15.5)
WBC Count: 1.9 10*3/uL — ABNORMAL LOW (ref 4.0–10.5)
nRBC: 0 % (ref 0.0–0.2)

## 2022-07-10 LAB — CMP (CANCER CENTER ONLY)
ALT: 13 U/L (ref 0–44)
AST: 16 U/L (ref 15–41)
Albumin: 4 g/dL (ref 3.5–5.0)
Alkaline Phosphatase: 65 U/L (ref 38–126)
Anion gap: 9 (ref 5–15)
BUN: 21 mg/dL (ref 8–23)
CO2: 25 mmol/L (ref 22–32)
Calcium: 9.5 mg/dL (ref 8.9–10.3)
Chloride: 103 mmol/L (ref 98–111)
Creatinine: 1.19 mg/dL — ABNORMAL HIGH (ref 0.44–1.00)
GFR, Estimated: 47 mL/min — ABNORMAL LOW (ref >60)
Glucose, Bld: 125 mg/dL — ABNORMAL HIGH (ref 70–99)
Potassium: 3.9 mmol/L (ref 3.5–5.1)
Sodium: 137 mmol/L (ref 135–145)
Total Bilirubin: 1.1 mg/dL (ref 0.3–1.2)
Total Protein: 6.9 g/dL (ref 6.5–8.1)

## 2022-07-10 MED ORDER — SODIUM CHLORIDE 0.9 % IV SOLN
45.0000 mg/m2 | Freq: Once | INTRAVENOUS | Status: AC
  Administered 2022-07-10: 84 mg via INTRAVENOUS
  Filled 2022-07-10: qty 14

## 2022-07-10 MED ORDER — SODIUM CHLORIDE 0.9% FLUSH: 10.0000 mL | INTRAVENOUS | Status: DC | PRN

## 2022-07-10 MED ORDER — SODIUM CHLORIDE 0.9 % IV SOLN: INTRAVENOUS | Status: AC

## 2022-07-10 MED ORDER — SODIUM CHLORIDE 0.9 % IV SOLN
140.0000 mg | Freq: Once | INTRAVENOUS | Status: AC
  Administered 2022-07-10: 140 mg via INTRAVENOUS
  Filled 2022-07-10: qty 14

## 2022-07-10 MED ORDER — SODIUM CHLORIDE 0.9 % IV SOLN: Freq: Once | INTRAVENOUS | Status: AC

## 2022-07-10 MED ORDER — FAMOTIDINE IN NACL 20-0.9 MG/50ML-% IV SOLN
20.0000 mg | Freq: Once | INTRAVENOUS | Status: AC
  Administered 2022-07-10: 20 mg via INTRAVENOUS
  Filled 2022-07-10: qty 50

## 2022-07-10 MED ORDER — CETIRIZINE HCL 10 MG/ML IV SOLN
10.0000 mg | Freq: Once | INTRAVENOUS | Status: AC
  Administered 2022-07-10: 10 mg via INTRAVENOUS
  Filled 2022-07-10: qty 1

## 2022-07-10 MED ORDER — HEPARIN SOD (PORK) LOCK FLUSH 100 UNIT/ML IV SOLN: 500.0000 [IU] | Freq: Once | INTRAVENOUS | Status: DC | PRN

## 2022-07-10 MED ORDER — SODIUM CHLORIDE 0.9 % IV SOLN
10.0000 mg | Freq: Once | INTRAVENOUS | Status: AC
  Administered 2022-07-10: 10 mg via INTRAVENOUS
  Filled 2022-07-10: qty 10

## 2022-07-10 MED ORDER — PALONOSETRON HCL INJECTION 0.25 MG/5ML
0.2500 mg | Freq: Once | INTRAVENOUS | Status: AC
  Administered 2022-07-10: 0.25 mg via INTRAVENOUS
  Filled 2022-07-10: qty 5

## 2022-07-10 NOTE — Progress Notes (Signed)
[place] Telephone:([phone]   Fax:(336) [phone]  OFFICE PROGRESS NOTE  [name], MD [place] [id number]  DIAGNOSIS: Recurrent lung cancer initially diagnosed as stage IA (T1, N0, M0) non-small cell lung cancer, squamous cell carcinoma.  She presented with a right lower lobe lung nodule.  She was diagnosed in June 2021.    PRIOR THERAPY: Right lower lobe superior segmentectomy and lymph node dissection under the care of Dr. Roxan Hockey on 03/17/2020.   CURRENT THERAPY: Concurrent chemoradiation with weekly carboplatin for AUC of 2 and paclitaxel 45 Mg/M2.  First dose 06/05/2022.  Status post 5 cycles.  INTERVAL HISTORY: Amy [name] 75 y.o. female returns to the clinic today for follow-up visit accompanied by her husband.  The patient is complaining of increasing fatigue and weakness and she lost several pounds since her last visit.  She has odynophagia and dysphagia.  She is currently on Carafate as well as omeprazole.  She denied having any chest pain, shortness of breath, cough or hemoptysis.  She has intermittent nausea with no vomiting, diarrhea or constipation.  She has no headache or visual changes.  She is here today for evaluation before starting cycle #6.  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  Status post right lower lobe superior segmentectomy    ALLERGIES:  has No Known Allergies.  MEDICATIONS:  Current Outpatient Medications  Medication Sig Dispense Refill   ADVAIR DISKUS 250-50 MCG/ACT AEPB Inhale 1 puff into the lungs 2 (two) times daily.     albuterol (VENTOLIN HFA) 108  (90 Base) MCG/ACT inhaler Inhale 2 puffs into the lungs every 6 (six) hours as needed for wheezing or shortness of breath. (Patient not taking: Reported on 06/26/2022)     HYDROcodone bit-homatropine (HYCODAN) 5-1.5 MG/5ML syrup Take 5 mLs by mouth every 6 (six) hours as needed for cough. 120 mL 0   ipratropium (ATROVENT) 0.06 % nasal spray Place 2 sprays into both nostrils daily as needed for rhinitis. (Patient not taking: Reported on 06/26/2022)     Multiple Vitamins-Minerals (MULTIVITAMIN ADULT) CHEW Chew 1 each by mouth daily.     omeprazole (PRILOSEC) 40 MG capsule Take 40 mg by mouth daily.     OVER THE COUNTER MEDICATION Take 1 capsule by mouth daily. Linseed oil + Omega 3 combination supplement     prochlorperazine (COMPAZINE) 10 MG tablet Take 1 tablet (10 mg total) by mouth every 6 (six) hours as needed for nausea or vomiting. 30 tablet 0   raloxifene (EVISTA) 60 MG tablet Take 60 mg by mouth daily.     sertraline (ZOLOFT) 50 MG tablet Take 50 mg by mouth daily.     simvastatin (ZOCOR) 40 MG tablet Take 40 mg by mouth every evening.      sucralfate (CARAFATE) 1 g tablet Take 1 tablet (1 g total) by mouth 4 (four) times daily -  with meals and at bedtime. 5 min before meals for radiation induced esophagitis (Patient not taking: Reported on 06/26/2022) 120 tablet 2   traZODone (  DESYREL) 50 MG tablet Take 50 mg by mouth at bedtime as needed for sleep.     No current facility-administered medications for this visit.    SURGICAL HISTORY:  Past Surgical History:  Procedure Laterality Date   APPENDECTOMY     EYE SURGERY     INTERCOSTAL NERVE BLOCK Right 03/17/2020   Procedure: Intercostal Nerve Block;  Surgeon: [name], MD;  Location: [facility];  Service: Thoracic;  Laterality: Right;   LUNG SURGERY Right 03/17/2020   NODE DISSECTION Right 03/17/2020   Procedure: Node Dissection;  Surgeon: [name], MD;  Location: [facility];  Service: Thoracic;  Laterality: Right;   TUBAL  LIGATION     VIDEO BRONCHOSCOPY WITH ENDOBRONCHIAL ULTRASOUND N/A 05/22/2022   Procedure: VIDEO BRONCHOSCOPY WITH ENDOBRONCHIAL ULTRASOUND;  Surgeon: [name], MD;  Location: [facility];  Service: Thoracic;  Laterality: N/A;    REVIEW OF SYSTEMS:  Constitutional: positive for fatigue and weight loss Eyes: negative Ears, nose, mouth, throat, and face: negative Respiratory: negative Cardiovascular: negative Gastrointestinal: positive for dysphagia and odynophagia Genitourinary:negative Integument/breast: negative Hematologic/lymphatic: negative Musculoskeletal:negative Neurological: negative Behavioral/Psych: negative Endocrine: negative Allergic/Immunologic: negative   PHYSICAL EXAMINATION: General appearance: alert, cooperative, fatigued, and no distress Head: Normocephalic, without obvious abnormality, atraumatic Neck: no adenopathy, no JVD, supple, symmetrical, trachea midline, and thyroid not enlarged, symmetric, no tenderness/mass/nodules Lymph nodes: Cervical, supraclavicular, and axillary nodes normal. Resp: clear to auscultation bilaterally Back: symmetric, no curvature. ROM normal. No CVA tenderness. Cardio: regular rate and rhythm, S1, S2 normal, no murmur, click, rub or gallop GI: soft, non-tender; bowel sounds normal; no masses,  no organomegaly Extremities: extremities normal, atraumatic, no cyanosis or edema Neurologic: Alert and oriented X 3, normal strength and tone. Normal symmetric reflexes. Normal coordination and gait  ECOG PERFORMANCE STATUS: 1 - Symptomatic but completely ambulatory  Blood pressure (!) 115/55, pulse 95, resp. rate 17, weight 162 lb 9 oz (73.7 kg), SpO2 96 %.  LABORATORY DATA: Lab Results  Component Value Date   WBC 1.9 (L) 07/10/2022   HGB 11.6 (L) 07/10/2022   HCT 33.7 (L) 07/10/2022   MCV 86.9 07/10/2022   PLT 169 07/10/2022      Chemistry      Component Value Date/Time   NA 139 07/03/2022 0815   K 4.0 07/03/2022 0815    CL 108 07/03/2022 0815   CO2 23 07/03/2022 0815   BUN 21 07/03/2022 0815   CREATININE 1.32 (H) 07/03/2022 0815      Component Value Date/Time   CALCIUM 9.4 07/03/2022 0815   ALKPHOS 64 07/03/2022 0815   AST 20 07/03/2022 0815   ALT 16 07/03/2022 0815   BILITOT 0.7 07/03/2022 0815       RADIOGRAPHIC STUDIES: No results found.  ASSESSMENT AND PLAN: This is a very pleasant 76 years old white female with suspicious recurrent non-small cell lung cancer initially diagnosed as history of stage IA (T1 a, N0, M0) non-small cell lung cancer, squamous cell carcinoma presented with right lower lobe lung nodule diagnosed in June 2021 status post right lower lobe superior segmentectomy with lymph node dissection under the care of Dr. Roxan Hockey on March 17, 2020.  The patient has evidence for disease recurrence in July 2023. The patient is feeling fine today with no concerning complaints except for the back pain. She had repeat CT scan of the chest performed recently.  I personally and independently reviewed the scan images and discussed the results with the patient today. Her scan showed  mild increase in the right infrahilar soft tissue encasing the bronchus intermedius and disease recurrence could not be completely excluded.  There was also new mild bilateral lower paratracheal lymphadenopathy that is nonspecific and nodal metastasis could not be completely excluded. The patient had a PET scan performed recently and that showed intensely hypermetabolic recurrence in the right infrahilar region with mildly prominent left paratracheal and paraesophageal lymph nodes also mildly hypermetabolic for size and suspicious for nodal metastasis but no distant metastatic disease. The patient underwent repeat bronchoscopy with EBUS under the care of Dr. Roxan Hockey and the final pathology was consistent with recurrent squamous cell carcinoma of the lung.  MRI of the brain was negative for malignancy. She is currently  on treatment with concurrent chemoradiation with weekly carboplatin for AUC of 2 and paclitaxel 45 Mg/M2.  First dose today June 05, 2022.  Status post 5 cycles. The patient has been tolerating this treatment well except for the dysphagia and odynophagia as well as weight loss. I recommended for her to proceed with cycle #6 today as planned. I will see her back for follow-up visit in 1 months for evaluation with repeat CT scan of the chest for restaging of her disease. For the odynophagia, she will continue her current treatment with Carafate and Prilosec. For the dehydration and lack of appetite, I will arrange for the patient to receive 1 L of normal saline in the clinic today. The patient was advised to call immediately if she has any other concerning symptoms in the interval.  The patient voices understanding of current disease status and treatment options and is in agreement with the current care plan.  All questions were answered. The patient knows to call the clinic with any problems, questions or concerns. We can certainly see the patient much sooner if necessary. The total time spent in the appointment was 30 minutes.   Disclaimer: This note was dictated with voice recognition software. Similar sounding words can inadvertently be transcribed and may not be corrected upon review.

## 2022-07-10 NOTE — Patient Instructions (Signed)
[facility]   Discharge Instructions: Thank you for choosing [facility] to provide your oncology and hematology care.   If you have a lab appointment with the [facility], please go directly to the [facility] and check in at the registration area.   Wear comfortable clothing and clothing appropriate for easy access to any Portacath or PICC line.   We strive to give you quality time with your provider. You may need to reschedule your appointment if you arrive late (15 or more minutes).  Arriving late affects you and other patients whose appointments are after yours.  Also, if you miss three or more appointments without notifying the office, you may be dismissed from the clinic at the provider's discretion.      For prescription refill requests, have your pharmacy contact our office and allow 72 hours for refills to be completed.    Today you received the following chemotherapy and/or immunotherapy agents: paclitaxel and carboplatin      To help prevent nausea and vomiting after your treatment, we encourage you to take your nausea medication as directed.  BELOW ARE SYMPTOMS THAT SHOULD BE REPORTED IMMEDIATELY: *FEVER GREATER THAN 100.4 F (38 C) OR HIGHER *CHILLS OR SWEATING *NAUSEA AND VOMITING THAT IS NOT CONTROLLED WITH YOUR NAUSEA MEDICATION *UNUSUAL SHORTNESS OF BREATH *UNUSUAL BRUISING OR BLEEDING *URINARY PROBLEMS (pain or burning when urinating, or frequent urination) *BOWEL PROBLEMS (unusual diarrhea, constipation, pain near the anus) TENDERNESS IN MOUTH AND THROAT WITH OR WITHOUT PRESENCE OF ULCERS (sore throat, sores in mouth, or a toothache) UNUSUAL RASH, SWELLING OR PAIN  UNUSUAL VAGINAL DISCHARGE OR ITCHING   Items with * indicate a potential emergency and should be followed up as soon as possible or go to the Emergency Department if any problems should occur.  Please show the CHEMOTHERAPY ALERT CARD or IMMUNOTHERAPY ALERT  CARD at check-in to the Emergency Department and triage nurse.  Should you have questions after your visit or need to cancel or reschedule your appointment, please contact [facility]  Dept: [phone]  and follow the prompts.  Office hours are 8:00 a.m. to 4:30 p.m. Monday - Friday. Please note that voicemails left after 4:00 p.m. may not be returned until the following business day.  We are closed weekends and major holidays. You have access to a nurse at all times for urgent questions. Please call the main number to the clinic Dept: [phone] and follow the prompts.   For any non-urgent questions, you may also contact your provider using MyChart. We now offer e-Visits for anyone 44 and older to request care online for non-urgent symptoms. For details visit [email or web address].   Also download the MyChart app! Go to the app store, search "MyChart", open the app, select [facility], and log in with your MyChart username and password.  Masks are optional in the cancer centers. If you would like for your care team to wear a mask while they are taking care of you, please let them know. You may have one support Giannis Corpuz who is at least 78 years old accompany you for your appointments.

## 2022-07-10 NOTE — Progress Notes (Signed)
Per Dr. Julien Nordmann - entered 1 liter NS IV to be given over 2 hours today while in infusion

## 2022-07-10 NOTE — Progress Notes (Signed)
Okay to proceed with ANC 1.4 and run 1 liter of NS along with treatment

## 2022-07-11 ENCOUNTER — Ambulatory Visit
Admission: RE | Admit: 2022-07-11 | Discharge: 2022-07-11 | Disposition: A | Discharge status: Home / Self Care | Payer: Medicare HMO | Source: Ambulatory Visit | Attending: Radiation Oncology | Admitting: Radiation Oncology

## 2022-07-11 ENCOUNTER — Other Ambulatory Visit: Payer: Self-pay

## 2022-07-11 DIAGNOSIS — R1310 Dysphagia, unspecified: Secondary | ICD-10-CM | POA: Diagnosis not present

## 2022-07-11 DIAGNOSIS — Z87891 Personal history of nicotine dependence: Secondary | ICD-10-CM | POA: Diagnosis not present

## 2022-07-11 DIAGNOSIS — C3431 Malignant neoplasm of lower lobe, right bronchus or lung: Secondary | ICD-10-CM | POA: Diagnosis not present

## 2022-07-11 DIAGNOSIS — K2210 Ulcer of esophagus without bleeding: Secondary | ICD-10-CM | POA: Diagnosis not present

## 2022-07-11 DIAGNOSIS — Z510 Encounter for antineoplastic radiation therapy: Secondary | ICD-10-CM | POA: Diagnosis not present

## 2022-07-11 LAB — RAD ONC ARIA SESSION SUMMARY
Course Elapsed Days: 36
Plan Fractions Treated to Date: 26
Plan Prescribed Dose Per Fraction: 2 Gy
Plan Total Fractions Prescribed: 33
Plan Total Prescribed Dose: 66 Gy
Reference Point Dosage Given to Date: 52 Gy
Reference Point Session Dosage Given: 2 Gy
Session Number: 26

## 2022-07-12 ENCOUNTER — Other Ambulatory Visit: Payer: Self-pay

## 2022-07-12 ENCOUNTER — Ambulatory Visit
Admission: RE | Admit: 2022-07-12 | Discharge: 2022-07-12 | Disposition: A | Discharge status: Home / Self Care | Payer: Medicare HMO | Source: Ambulatory Visit | Attending: Radiation Oncology | Admitting: Radiation Oncology

## 2022-07-12 DIAGNOSIS — Z87891 Personal history of nicotine dependence: Secondary | ICD-10-CM | POA: Diagnosis not present

## 2022-07-12 DIAGNOSIS — Z510 Encounter for antineoplastic radiation therapy: Secondary | ICD-10-CM | POA: Diagnosis not present

## 2022-07-12 DIAGNOSIS — C3431 Malignant neoplasm of lower lobe, right bronchus or lung: Secondary | ICD-10-CM | POA: Diagnosis not present

## 2022-07-12 LAB — RAD ONC ARIA SESSION SUMMARY
Course Elapsed Days: 37
Plan Fractions Treated to Date: 27
Plan Prescribed Dose Per Fraction: 2 Gy
Plan Total Fractions Prescribed: 33
Plan Total Prescribed Dose: 66 Gy
Reference Point Dosage Given to Date: 54 Gy
Reference Point Session Dosage Given: 2 Gy
Session Number: 27

## 2022-07-13 ENCOUNTER — Other Ambulatory Visit: Payer: Self-pay

## 2022-07-13 ENCOUNTER — Ambulatory Visit
Admission: RE | Admit: 2022-07-13 | Discharge: 2022-07-13 | Disposition: A | Discharge status: Home / Self Care | Payer: Medicare HMO | Source: Ambulatory Visit | Attending: Radiation Oncology | Admitting: Radiation Oncology

## 2022-07-13 DIAGNOSIS — Z510 Encounter for antineoplastic radiation therapy: Secondary | ICD-10-CM | POA: Diagnosis not present

## 2022-07-13 DIAGNOSIS — C3431 Malignant neoplasm of lower lobe, right bronchus or lung: Secondary | ICD-10-CM | POA: Diagnosis not present

## 2022-07-13 DIAGNOSIS — Z87891 Personal history of nicotine dependence: Secondary | ICD-10-CM | POA: Diagnosis not present

## 2022-07-13 LAB — RAD ONC ARIA SESSION SUMMARY
Course Elapsed Days: 38
Plan Fractions Treated to Date: 28
Plan Prescribed Dose Per Fraction: 2 Gy
Plan Total Fractions Prescribed: 33
Plan Total Prescribed Dose: 66 Gy
Reference Point Dosage Given to Date: 56 Gy
Reference Point Session Dosage Given: 2 Gy
Session Number: 28

## 2022-07-14 ENCOUNTER — Other Ambulatory Visit: Payer: Self-pay

## 2022-07-14 ENCOUNTER — Inpatient Hospital Stay (HOSPITAL_BASED_OUTPATIENT_CLINIC_OR_DEPARTMENT_OTHER): Payer: Medicare HMO | Admitting: Physician Assistant

## 2022-07-14 ENCOUNTER — Inpatient Hospital Stay (HOSPITAL_COMMUNITY)
Admission: EM | Admit: 2022-07-14 | Discharge: 2022-07-20 | DRG: 380 | Disposition: A | Discharge status: Home / Self Care | Payer: Medicare HMO | Attending: Internal Medicine | Admitting: Internal Medicine

## 2022-07-14 ENCOUNTER — Emergency Department (HOSPITAL_COMMUNITY): Payer: Medicare HMO

## 2022-07-14 ENCOUNTER — Ambulatory Visit: Payer: Medicare HMO

## 2022-07-14 ENCOUNTER — Inpatient Hospital Stay: Payer: Medicare HMO

## 2022-07-14 ENCOUNTER — Encounter (HOSPITAL_COMMUNITY): Payer: Self-pay

## 2022-07-14 ENCOUNTER — Emergency Department (HOSPITAL_BASED_OUTPATIENT_CLINIC_OR_DEPARTMENT_OTHER)
Admission: EM | Admit: 2022-07-14 | Discharge: 2022-07-14 | Discharge status: Against Medical Advice | Payer: Medicare HMO

## 2022-07-14 ENCOUNTER — Telehealth: Payer: Self-pay

## 2022-07-14 ENCOUNTER — Telehealth: Payer: Self-pay | Admitting: Radiation Oncology

## 2022-07-14 VITALS — BP 123/85 | HR 115 | Temp 97.8°F | Resp 18 | Wt 158.8 lb

## 2022-07-14 DIAGNOSIS — T66XXXA Radiation sickness, unspecified, initial encounter: Secondary | ICD-10-CM | POA: Diagnosis not present

## 2022-07-14 DIAGNOSIS — Z86010 Personal history of colonic polyps: Secondary | ICD-10-CM

## 2022-07-14 DIAGNOSIS — D701 Agranulocytosis secondary to cancer chemotherapy: Secondary | ICD-10-CM | POA: Diagnosis not present

## 2022-07-14 DIAGNOSIS — K219 Gastro-esophageal reflux disease without esophagitis: Secondary | ICD-10-CM | POA: Diagnosis not present

## 2022-07-14 DIAGNOSIS — I129 Hypertensive chronic kidney disease with stage 1 through stage 4 chronic kidney disease, or unspecified chronic kidney disease: Secondary | ICD-10-CM | POA: Diagnosis present

## 2022-07-14 DIAGNOSIS — Z923 Personal history of irradiation: Secondary | ICD-10-CM | POA: Diagnosis not present

## 2022-07-14 DIAGNOSIS — J90 Pleural effusion, not elsewhere classified: Secondary | ICD-10-CM | POA: Diagnosis not present

## 2022-07-14 DIAGNOSIS — E876 Hypokalemia: Secondary | ICD-10-CM | POA: Diagnosis not present

## 2022-07-14 DIAGNOSIS — T451X5A Adverse effect of antineoplastic and immunosuppressive drugs, initial encounter: Secondary | ICD-10-CM

## 2022-07-14 DIAGNOSIS — Z7901 Long term (current) use of anticoagulants: Secondary | ICD-10-CM

## 2022-07-14 DIAGNOSIS — R69 Illness, unspecified: Secondary | ICD-10-CM | POA: Diagnosis not present

## 2022-07-14 DIAGNOSIS — C3431 Malignant neoplasm of lower lobe, right bronchus or lung: Secondary | ICD-10-CM | POA: Diagnosis present

## 2022-07-14 DIAGNOSIS — R1310 Dysphagia, unspecified: Secondary | ICD-10-CM | POA: Diagnosis not present

## 2022-07-14 DIAGNOSIS — R1319 Other dysphagia: Secondary | ICD-10-CM | POA: Diagnosis not present

## 2022-07-14 DIAGNOSIS — K3189 Other diseases of stomach and duodenum: Secondary | ICD-10-CM | POA: Diagnosis not present

## 2022-07-14 DIAGNOSIS — D61818 Other pancytopenia: Secondary | ICD-10-CM

## 2022-07-14 DIAGNOSIS — Z7951 Long term (current) use of inhaled steroids: Secondary | ICD-10-CM

## 2022-07-14 DIAGNOSIS — J449 Chronic obstructive pulmonary disease, unspecified: Secondary | ICD-10-CM | POA: Diagnosis present

## 2022-07-14 DIAGNOSIS — E46 Unspecified protein-calorie malnutrition: Secondary | ICD-10-CM | POA: Diagnosis not present

## 2022-07-14 DIAGNOSIS — R509 Fever, unspecified: Secondary | ICD-10-CM | POA: Diagnosis not present

## 2022-07-14 DIAGNOSIS — C3490 Malignant neoplasm of unspecified part of unspecified bronchus or lung: Secondary | ICD-10-CM | POA: Diagnosis not present

## 2022-07-14 DIAGNOSIS — K2210 Ulcer of esophagus without bleeding: Secondary | ICD-10-CM | POA: Diagnosis not present

## 2022-07-14 DIAGNOSIS — Y842 Radiological procedure and radiotherapy as the cause of abnormal reaction of the patient, or of later complication, without mention of misadventure at the time of the procedure: Secondary | ICD-10-CM | POA: Diagnosis not present

## 2022-07-14 DIAGNOSIS — R531 Weakness: Secondary | ICD-10-CM | POA: Diagnosis not present

## 2022-07-14 DIAGNOSIS — Z6827 Body mass index (BMI) 27.0-27.9, adult: Secondary | ICD-10-CM | POA: Diagnosis not present

## 2022-07-14 DIAGNOSIS — R112 Nausea with vomiting, unspecified: Secondary | ICD-10-CM

## 2022-07-14 DIAGNOSIS — D61810 Antineoplastic chemotherapy induced pancytopenia: Secondary | ICD-10-CM | POA: Diagnosis not present

## 2022-07-14 DIAGNOSIS — R0602 Shortness of breath: Secondary | ICD-10-CM | POA: Diagnosis not present

## 2022-07-14 DIAGNOSIS — I4891 Unspecified atrial fibrillation: Secondary | ICD-10-CM

## 2022-07-14 DIAGNOSIS — Z79899 Other long term (current) drug therapy: Secondary | ICD-10-CM

## 2022-07-14 DIAGNOSIS — K208 Other esophagitis without bleeding: Secondary | ICD-10-CM | POA: Diagnosis not present

## 2022-07-14 DIAGNOSIS — K2101 Gastro-esophageal reflux disease with esophagitis, with bleeding: Secondary | ICD-10-CM | POA: Diagnosis not present

## 2022-07-14 DIAGNOSIS — I10 Essential (primary) hypertension: Secondary | ICD-10-CM | POA: Diagnosis not present

## 2022-07-14 DIAGNOSIS — K449 Diaphragmatic hernia without obstruction or gangrene: Secondary | ICD-10-CM | POA: Diagnosis not present

## 2022-07-14 DIAGNOSIS — N189 Chronic kidney disease, unspecified: Secondary | ICD-10-CM | POA: Diagnosis not present

## 2022-07-14 DIAGNOSIS — E785 Hyperlipidemia, unspecified: Secondary | ICD-10-CM | POA: Diagnosis present

## 2022-07-14 DIAGNOSIS — K209 Esophagitis, unspecified without bleeding: Secondary | ICD-10-CM | POA: Diagnosis not present

## 2022-07-14 DIAGNOSIS — Z8719 Personal history of other diseases of the digestive system: Secondary | ICD-10-CM | POA: Diagnosis not present

## 2022-07-14 DIAGNOSIS — T66XXXD Radiation sickness, unspecified, subsequent encounter: Secondary | ICD-10-CM | POA: Diagnosis not present

## 2022-07-14 DIAGNOSIS — K2100 Gastro-esophageal reflux disease with esophagitis, without bleeding: Secondary | ICD-10-CM | POA: Diagnosis present

## 2022-07-14 DIAGNOSIS — Z85118 Personal history of other malignant neoplasm of bronchus and lung: Secondary | ICD-10-CM

## 2022-07-14 DIAGNOSIS — Z87891 Personal history of nicotine dependence: Secondary | ICD-10-CM | POA: Diagnosis not present

## 2022-07-14 DIAGNOSIS — F419 Anxiety disorder, unspecified: Secondary | ICD-10-CM | POA: Diagnosis present

## 2022-07-14 DIAGNOSIS — R300 Dysuria: Secondary | ICD-10-CM | POA: Diagnosis not present

## 2022-07-14 DIAGNOSIS — J439 Emphysema, unspecified: Secondary | ICD-10-CM | POA: Diagnosis not present

## 2022-07-14 DIAGNOSIS — I480 Paroxysmal atrial fibrillation: Secondary | ICD-10-CM | POA: Diagnosis not present

## 2022-07-14 DIAGNOSIS — D649 Anemia, unspecified: Secondary | ICD-10-CM | POA: Diagnosis not present

## 2022-07-14 LAB — CMP (CANCER CENTER ONLY)
ALT: 12 U/L (ref 0–44)
AST: 16 U/L (ref 15–41)
Albumin: 4.1 g/dL (ref 3.5–5.0)
Alkaline Phosphatase: 64 U/L (ref 38–126)
Anion gap: 8 (ref 5–15)
BUN: 23 mg/dL (ref 8–23)
CO2: 27 mmol/L (ref 22–32)
Calcium: 9.4 mg/dL (ref 8.9–10.3)
Chloride: 103 mmol/L (ref 98–111)
Creatinine: 0.95 mg/dL (ref 0.44–1.00)
GFR, Estimated: >60 mL/min (ref >60)
Glucose, Bld: 115 mg/dL — ABNORMAL HIGH (ref 70–99)
Potassium: 4.3 mmol/L (ref 3.5–5.1)
Sodium: 138 mmol/L (ref 135–145)
Total Bilirubin: 2.1 mg/dL — ABNORMAL HIGH (ref 0.3–1.2)
Total Protein: 6.9 g/dL (ref 6.5–8.1)

## 2022-07-14 LAB — CBC WITH DIFFERENTIAL (CANCER CENTER ONLY)
Abs Immature Granulocytes: 0.02 10*3/uL (ref 0.00–0.07)
Basophils Absolute: 0 10*3/uL (ref 0.0–0.1)
Basophils Relative: 1 %
Eosinophils Absolute: 0 10*3/uL (ref 0.0–0.5)
Eosinophils Relative: 0 %
HCT: 33.2 % — ABNORMAL LOW (ref 36.0–46.0)
Hemoglobin: 11.4 g/dL — ABNORMAL LOW (ref 12.0–15.0)
Immature Granulocytes: 2 %
Lymphocytes Relative: 11 %
Lymphs Abs: 0.1 10*3/uL — ABNORMAL LOW (ref 0.7–4.0)
MCH: 29.8 pg (ref 26.0–34.0)
MCHC: 34.3 g/dL (ref 30.0–36.0)
MCV: 86.9 fL (ref 80.0–100.0)
Monocytes Absolute: 0.1 10*3/uL (ref 0.1–1.0)
Monocytes Relative: 6 %
Neutro Abs: 1 10*3/uL — ABNORMAL LOW (ref 1.7–7.7)
Neutrophils Relative %: 80 %
Platelet Count: 163 10*3/uL (ref 150–400)
RBC: 3.82 MIL/uL — ABNORMAL LOW (ref 3.87–5.11)
RDW: 16.7 % — ABNORMAL HIGH (ref 11.5–15.5)
WBC Count: 1.2 10*3/uL — ABNORMAL LOW (ref 4.0–10.5)
nRBC: 0 % (ref 0.0–0.2)

## 2022-07-14 LAB — GLUCOSE, CAPILLARY: Glucose-Capillary: 90 mg/dL (ref 70–99)

## 2022-07-14 MED ORDER — PANTOPRAZOLE SODIUM 40 MG IV SOLR: 40.0000 mg | Freq: Once | INTRAVENOUS | Status: DC

## 2022-07-14 MED ORDER — DILTIAZEM HCL 25 MG/5ML IV SOLN
10.0000 mg | Freq: Once | INTRAVENOUS | Status: AC
  Administered 2022-07-14: 10 mg via INTRAVENOUS
  Filled 2022-07-14: qty 5

## 2022-07-14 MED ORDER — ENOXAPARIN SODIUM 80 MG/0.8ML IJ SOSY
70.0000 mg | PREFILLED_SYRINGE | Freq: Two times a day (BID) | INTRAMUSCULAR | Status: AC
  Administered 2022-07-15 – 2022-07-17 (×6): 70 mg via SUBCUTANEOUS
  Filled 2022-07-14 (×7): qty 0.7

## 2022-07-14 MED ORDER — FAMOTIDINE IN NACL 20-0.9 MG/50ML-% IV SOLN
20.0000 mg | Freq: Once | INTRAVENOUS | Status: AC
  Administered 2022-07-14: 20 mg via INTRAVENOUS
  Filled 2022-07-14: qty 50

## 2022-07-14 MED ORDER — LIDOCAINE VISCOUS HCL 2 % MT SOLN
15.0000 mL | OROMUCOSAL | Status: DC
  Administered 2022-07-14 – 2022-07-15 (×2): 15 mL via OROMUCOSAL
  Filled 2022-07-14 (×2): qty 15

## 2022-07-14 MED ORDER — TBO-FILGRASTIM 480 MCG/0.8ML ~~LOC~~ SOSY
480.0000 ug | PREFILLED_SYRINGE | Freq: Once | SUBCUTANEOUS | Status: AC
  Administered 2022-07-14: 480 ug via SUBCUTANEOUS
  Filled 2022-07-14: qty 0.8

## 2022-07-14 MED ORDER — DILTIAZEM HCL-DEXTROSE 125-5 MG/125ML-% IV SOLN (PREMIX)
5.0000 mg/h | INTRAVENOUS | Status: DC
  Administered 2022-07-14 – 2022-07-15 (×2): 5 mg/h via INTRAVENOUS
  Administered 2022-07-16 – 2022-07-19 (×3): 7.5 mg/h via INTRAVENOUS
  Filled 2022-07-14 (×6): qty 125

## 2022-07-14 MED ORDER — DILTIAZEM HCL ER COATED BEADS 120 MG PO CP24: 120.0000 mg | ORAL_CAPSULE | Freq: Once | ORAL | Status: DC

## 2022-07-14 MED ORDER — SODIUM CHLORIDE 0.9 % IV BOLUS: 500.0000 mL | Freq: Once | INTRAVENOUS | Status: DC

## 2022-07-14 MED ORDER — ONDANSETRON HCL 4 MG/2ML IJ SOLN
4.0000 mg | Freq: Once | INTRAMUSCULAR | Status: AC
  Administered 2022-07-14: 4 mg via INTRAVENOUS
  Filled 2022-07-14: qty 2

## 2022-07-14 MED ORDER — PROCHLORPERAZINE EDISYLATE 10 MG/2ML IJ SOLN
10.0000 mg | Freq: Four times a day (QID) | INTRAMUSCULAR | Status: DC | PRN
  Administered 2022-07-15 – 2022-07-20 (×6): 10 mg via INTRAVENOUS
  Filled 2022-07-14 (×7): qty 2

## 2022-07-14 MED ORDER — SODIUM CHLORIDE 0.9 % IV SOLN: INTRAVENOUS | Status: DC

## 2022-07-14 MED ORDER — ENOXAPARIN SODIUM 80 MG/0.8ML IJ SOSY
1.0000 mg/kg | PREFILLED_SYRINGE | Freq: Once | INTRAMUSCULAR | Status: AC
  Administered 2022-07-14: 72.5 mg via SUBCUTANEOUS
  Filled 2022-07-14: qty 0.72

## 2022-07-14 MED ORDER — PANTOPRAZOLE SODIUM 40 MG IV SOLR
40.0000 mg | Freq: Two times a day (BID) | INTRAVENOUS | Status: DC
  Administered 2022-07-14 – 2022-07-20 (×11): 40 mg via INTRAVENOUS
  Filled 2022-07-14 (×11): qty 10

## 2022-07-14 MED ORDER — ENOXAPARIN SODIUM 80 MG/0.8ML IJ SOSY: 1.0000 mg/kg | PREFILLED_SYRINGE | Freq: Two times a day (BID) | INTRAMUSCULAR | Status: DC

## 2022-07-14 MED ORDER — SODIUM CHLORIDE 0.9 % IV SOLN: Freq: Once | INTRAVENOUS | Status: AC

## 2022-07-14 MED FILL — Dexamethasone Sodium Phosphate Inj 100 MG/10ML: INTRAMUSCULAR | Qty: 1 | Status: AC

## 2022-07-14 NOTE — Telephone Encounter (Signed)
Amy [name] called to inform radiation staff that she wasn't feeling well had been nauseous, vomiting, and feeling weak.  She said she will be going over to [facility] to be seen and possibly get fluids.  She canceled today's treatment.

## 2022-07-14 NOTE — Progress Notes (Signed)
Symptom Management Consult note [facility]    Patient Care Team: [name], MD as PCP - General (Family Medicine)    Name of the patient: Amy [name]  [id number]  [birth year]   Date of visit: 07/14/2022   Chief Complaint/Reason for visit: nausea and vomiting   Current Therapy: carboplatin, paclitaxel  Last treatment:  Day 1  Cycle 6 on 07/10/22   ASSESSMENT & PLAN: Patient is a 76 y.o. female  with oncologic history of Recurrent lung cancer initially diagnosed as stage IA (T1, N0, M0) non-small cell lung cancer, squamous cell carcinoma followed by Dr. Julien Nordmann.  I have viewed most recent oncology note and lab work.   #)Afib -EKG performed and interpreted. New onset. Based on HPI patient has been symptomatic x 1 week.  Denies cardiac history. -HR ranging from 30-130 in office. -CMP without significant electrolyte derangement. -Patient taken to ED for further evaluation. Report given to ED RN and MD.  #)Neutropenia -CBC today showing WBC 1.2 and ANC 1.0. No fevers. -Discussed neutropenic precautions  #)Nausea and vomiting -IVF, pepcid and zofran given -Reassessed patient and she was able to tolerate sips of water  #) Recurrent lung cancer initially diagnosed as stage IA (T1, N0, M0) non-small cell lung cancer, squamous cell carcinoma -Missed radiation today because she was feeling so poorly. -Chemo is scheduled for 07/17/22. - Next appointment with oncologist is 07/17/22.   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 female with oncologic history as above presenting to [facility] today with chief complaint of nausea with vomiting and generalized weakness. Patient states for the last 2 week she has been feeling unwell. Over the last week she has had severe nausea, vomiting, generalized weakness and dizziness.  She states she typically feels bad after chemo however the symptoms are different.  She has vomited twice in the last 24 hours. Patient still experiencing odynphagia and dysphagia, currently taking Carafate and omeprazole Patient reports having recent UTI was prescribed antibiotics by her PCP, she is unsure which one it was. Still having dysuria. She has 2 missed doses of antibiotic because of severe nausea. She did not take any medications PTA. Patient went to ED this morning however due to long wait time, she left without being seen. She denies any fever, chills, hematemesis, shortness of breath, chest pain, back pain.    ROS  All other systems are reviewed and are negative for acute change except as noted in the HPI.    No Known Allergies   Past Medical History:  Diagnosis Date   Anxiety    Arthritis    Chronic kidney disease    COPD (chronic obstructive pulmonary disease) ([facility]) 2021   Depression 01/28/2020   Dyspnea    History of hiatal hernia    Hyperlipidemia    Hypertension    Pneumonia    Squamous  cell carcinoma of bronchus in right lower lobe ([facility]) 04/06/2020   T1, N0, stage Ia.  Status post right lower lobe superior segmentectomy     Past Surgical History:  Procedure Laterality Date   APPENDECTOMY     EYE SURGERY     INTERCOSTAL NERVE BLOCK Right 03/17/2020   Procedure: Intercostal Nerve Block;  Surgeon: [name], MD;  Location: [facility];  Service: Thoracic;  Laterality: Right;   LUNG SURGERY Right 03/17/2020    NODE DISSECTION Right 03/17/2020   Procedure: Node Dissection;  Surgeon: [name], MD;  Location: [facility];  Service: Thoracic;  Laterality: Right;   TUBAL LIGATION     VIDEO BRONCHOSCOPY WITH ENDOBRONCHIAL ULTRASOUND N/A 05/22/2022   Procedure: VIDEO BRONCHOSCOPY WITH ENDOBRONCHIAL ULTRASOUND;  Surgeon: [name], MD;  Location: [facility];  Service: Thoracic;  Laterality: N/A;    Social History   Socioeconomic History   Marital status: Married    Spouse name: Not on file   Number of children: Not on file   Years of education: Not on file   Highest education level: Not on file  Occupational History   Not on file  Tobacco Use   Smoking status: Former    Packs/day: 1.00    Years: 55.00    Total pack years: 55.00    Types: Cigarettes    Quit date: 03/25/2020    Years since quitting: 2.3   Smokeless tobacco: Never  Vaping Use   Vaping Use: Not on file  Substance and Sexual Activity   Alcohol use: Yes    Comment: Social with wine   Drug use: Never   Sexual activity: Not on file  Other Topics Concern   Not on file  Social History Narrative   Not on file   Social Determinants of Health   Financial Resource Strain: Not on file  Food Insecurity: Not on file  Transportation Needs: Not on file  Physical Activity: Not on file  Stress: Not on file  Social Connections: Not on file  Intimate Partner Violence: Not on file    No family history on file.   Current Outpatient Medications:    ADVAIR DISKUS 250-50 MCG/ACT AEPB, Inhale 1 puff into the lungs 2 (two) times daily., Disp: , Rfl:    albuterol (VENTOLIN HFA) 108 (90 Base) MCG/ACT inhaler, Inhale 2 puffs into the lungs every 6 (six) hours as needed for wheezing or shortness of breath. (Patient not taking: Reported on 06/26/2022), Disp: , Rfl:    HYDROcodone bit-homatropine (HYCODAN) 5-1.5 MG/5ML syrup, Take 5 mLs by mouth every 6 (six) hours as needed for cough., Disp: 120 mL, Rfl: 0   ipratropium (ATROVENT)  0.06 % nasal spray, Place 2 sprays into both nostrils daily as needed for rhinitis. (Patient not taking: Reported on 06/26/2022), Disp: , Rfl:    Multiple Vitamins-Minerals (MULTIVITAMIN ADULT) CHEW, Chew 1 each by mouth daily., Disp: , Rfl:    omeprazole (PRILOSEC) 40 MG capsule, Take 40 mg by mouth daily., Disp: , Rfl:    OVER THE COUNTER MEDICATION, Take 1 capsule by mouth daily. Linseed oil + Omega 3 combination supplement, Disp: , Rfl:    prochlorperazine (COMPAZINE) 10 MG tablet, Take 1 tablet (10 mg total) by mouth every 6 (six) hours as needed for nausea or vomiting., Disp: 30 tablet, Rfl: 0   raloxifene (EVISTA) 60 MG tablet, Take 60 mg by mouth daily., Disp: , Rfl:    sertraline (ZOLOFT) 50 MG tablet, Take 50 mg  by mouth daily., Disp: , Rfl:    simvastatin (ZOCOR) 40 MG tablet, Take 40 mg by mouth every evening. , Disp: , Rfl:    sucralfate (CARAFATE) 1 g tablet, Take 1 tablet (1 g total) by mouth 4 (four) times daily -  with meals and at bedtime. 5 min before meals for radiation induced esophagitis (Patient not taking: Reported on 06/26/2022), Disp: 120 tablet, Rfl: 2   traZODone (DESYREL) 50 MG tablet, Take 50 mg by mouth at bedtime as needed for sleep., Disp: , Rfl:   PHYSICAL EXAM: ECOG FS:2 - Symptomatic, <50% confined to bed    Vitals:   07/14/22 1106 07/14/22 1322  BP: 119/76 123/85  Pulse: (!) 111 (!) 115  Resp: 18   Temp: 97.8 F (36.6 C)   TempSrc: Oral   SpO2: 96% 98%  Weight: 158 lb 12.8 oz (72 kg)    Physical Exam Vitals and nursing note reviewed.  Constitutional:      Appearance: She is well-developed. She is not ill-appearing or toxic-appearing.  HENT:     Head: Normocephalic.     Nose: Nose normal.     Mouth/Throat:     Mouth: Mucous membranes are dry.  Eyes:     Conjunctiva/sclera: Conjunctivae normal.  Neck:     Vascular: No JVD.  Cardiovascular:     Rate and Rhythm: Tachycardia present. Rhythm irregular.     Pulses: Normal pulses.     Heart sounds:  Normal heart sounds.  Pulmonary:     Effort: Pulmonary effort is normal.     Breath sounds: Normal breath sounds.  Abdominal:     General: There is no distension.  Musculoskeletal:     Cervical back: Normal range of motion.  Skin:    General: Skin is warm and dry.  Neurological:     Mental Status: She is oriented to person, place, and time.        LABORATORY DATA: I have reviewed the data as listed    Latest Ref Rng & Units 07/14/2022   11:27 AM 07/10/2022    9:04 AM 07/03/2022    8:15 AM  CBC  WBC 4.0 - 10.5 K/uL 1.2  1.9  2.0   Hemoglobin 12.0 - 15.0 g/dL 11.4  11.6  12.1   Hematocrit 36.0 - 46.0 % 33.2  33.7  37.0   Platelets 150 - 400 K/uL 163  169  192         Latest Ref Rng & Units 07/14/2022   11:27 AM 07/10/2022    9:04 AM 07/03/2022    8:15 AM  CMP  Glucose 70 - 99 mg/dL 115  125  99   BUN 8 - 23 mg/dL 23  21  21    Creatinine 0.44 - 1.00 mg/dL 0.95  1.19  1.32   Sodium 135 - 145 mmol/L 138  137  139   Potassium 3.5 - 5.1 mmol/L 4.3  3.9  4.0   Chloride 98 - 111 mmol/L 103  103  108   CO2 22 - 32 mmol/L 27  25  23    Calcium 8.9 - 10.3 mg/dL 9.4  9.5  9.4   Total Protein 6.5 - 8.1 g/dL 6.9  6.9  6.7   Total Bilirubin 0.3 - 1.2 mg/dL 2.1  1.1  0.7   Alkaline Phos 38 - 126 U/L 64  65  64   AST 15 - 41 U/L 16  16  20    ALT 0 - 44 U/L  12  13  16         RADIOGRAPHIC STUDIES (from last 24 hours if applicable) I have personally reviewed the radiological images as listed and agreed with the findings in the report. No results found.      Visit Diagnosis: 1. Nausea and vomiting, unspecified vomiting type   2. Malignant neoplasm of unspecified part of unspecified bronchus or lung ([place])   3. Chemotherapy-induced neutropenia ([facility])   4. Atrial fibrillation, unspecified type ([place])      Orders Placed This Encounter  Procedures   ED EKG    Standing Status:   Standing    Number of Occurrences:   1    Order Specific Question:   Reason for Exam    Answer:    Weakness   EKG 12-Lead    Ordered by Georgeanna Lea      I have spent a total of 30 minutes minutes of face-to-face and non-face-to-face time, preparing to see the patient, obtaining and/or reviewing separately obtained history, performing a medically appropriate examination, counseling and educating the patient, ordering tests, documenting clinical information in the electronic health record, and care coordination (communications with other health care professionals or caregivers).    Thank you for allowing me to participate in the care of this patient.    Barrie Folk, PA-C Department of Hematology/Oncology [facility] at [facility] Phone: [phone]  Fax:(336) [phone]    07/14/2022 2:10 PM

## 2022-07-14 NOTE — ED Provider Notes (Signed)
From Dr. Roderic Palau.  76 year old female lung cancer on chemo and radiation sent in from clinic for 1 week of nausea vomiting weakness dizziness found to be in new A-fib.  She has received some IV rate control with improvement in her symptoms.  Cardiology recommendations for outpatient treatment with Cardizem.  Patient however unable to tolerate p.o. or swallow pills at this time.  Plan is to review with oncology and possible medicine admission. Physical Exam  BP 128/74 (BP Location: Right Arm)   Pulse (!) 106   Temp 98.6 F (37 C) (Oral)   Resp (!) 25   Ht 5\' 3"  (1.6 m)   Wt 71.7 kg   SpO2 99%   BMI 27.99 kg/m   Physical Exam  Procedures  Procedures  ED Course / MDM    Medical Decision Making Amount and/or Complexity of Data Reviewed Radiology: ordered.  Risk Prescription drug management. Decision regarding hospitalization.   Repeat EKG showing sinus with possible beats of A-fib.  Discussed with Dr. Marin Olp oncology who will see over the weekend if she is still here.  I reviewed results with patient.  She would rather go home but understands management would be difficult if she is unable to swallow any medications.  Discussed with Dr. Marylyn Ishihara from [facility] hospitalist who will evaluate patient for possible admission.       Hayden Rasmussen, MD 07/15/22 1053

## 2022-07-14 NOTE — H&P (Signed)
History and Physical    Patient: Amy [name] EHU:[id number] DOB: 27-Jul-1946 DOA: 07/14/2022 DOS: the patient was seen and examined on 07/14/2022 PCP: [name], MD  Patient coming from: Home  Chief Complaint:  Chief Complaint  Patient presents with   Shortness of Breath   HPI: [name] is a 76 y.o. female with medical history significant of NSCLC, COPD, HTN, HLD, anxiety. Presenting with difficulty swallowing. She reports that she's had at least a weeks worth of difficulty swallowing and nausea. She has tried anti-emetics and carafate, but has been unable to keep anything down. She feels that everything is getting stuck. She went to see her onco team today and was found to be tachycardic. It was recommended that she come to the ED for evaluation. She denies any other aggravating or alleviating factors.   Review of Systems: As mentioned in the history of present illness. All other systems reviewed and are negative. 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  Status post right lower lobe superior segmentectomy   Past Surgical History:  Procedure Laterality Date   APPENDECTOMY     EYE SURGERY     INTERCOSTAL NERVE BLOCK Right 03/17/2020   Procedure: Intercostal Nerve Block;  Surgeon: [name], MD;  Location: [facility];  Service: Thoracic;  Laterality: Right;   LUNG SURGERY Right 03/17/2020   NODE DISSECTION Right 03/17/2020   Procedure: Node Dissection;  Surgeon: [name], MD;  Location: [facility];  Service: Thoracic;  Laterality: Right;   TUBAL LIGATION     VIDEO BRONCHOSCOPY WITH ENDOBRONCHIAL ULTRASOUND N/A 05/22/2022   Procedure: VIDEO  BRONCHOSCOPY WITH ENDOBRONCHIAL ULTRASOUND;  Surgeon: [name], MD;  Location: [facility];  Service: Thoracic;  Laterality: N/A;   Social History:  reports that she quit smoking about 2 years ago. Her smoking use included cigarettes. She has a 55.00 pack-year smoking history. She has never used smokeless tobacco. She reports current alcohol use. She reports that she does not use drugs.  No Known Allergies  History reviewed. No pertinent family history.  Prior to Admission medications   Medication Sig Start Date End Date Taking? Authorizing Provider  ADVAIR DISKUS 250-50 MCG/ACT AEPB Inhale 1 puff into the lungs 2 (two) times daily. 05/28/22   [provider]  albuterol (VENTOLIN HFA) 108 (90 Base) MCG/ACT inhaler Inhale 2 puffs into the lungs every 6 (six) hours as needed for wheezing or shortness of breath. Patient not taking: Reported on 06/26/2022    [provider]  HYDROcodone bit-homatropine (HYCODAN) 5-1.5 MG/5ML syrup Take 5 mLs by mouth every 6 (six) hours as needed for cough. 06/26/22   Curt Bears, MD  ipratropium (ATROVENT) 0.06 % nasal spray Place 2 sprays into both nostrils daily as needed for rhinitis. Patient not taking: Reported on 06/26/2022 12/19/17   [provider]  Multiple Vitamins-Minerals (MULTIVITAMIN ADULT) CHEW Chew 1 each by mouth daily.    [provider]  omeprazole (PRILOSEC) 40 MG capsule Take 40 mg by mouth daily.    [provider]  OVER THE COUNTER MEDICATION Take 1 capsule  by mouth daily. Linseed oil + Omega 3 combination supplement    [provider]  prochlorperazine (COMPAZINE) 10 MG tablet Take 1 tablet (10 mg total) by mouth every 6 (six) hours as needed for nausea or vomiting. 05/25/22   Curt Bears, MD  raloxifene (EVISTA) 60 MG tablet Take 60 mg by mouth daily.    [provider]  sertraline (ZOLOFT) 50 MG tablet Take 50 mg by mouth daily.    [provider]   simvastatin (ZOCOR) 40 MG tablet Take 40 mg by mouth every evening.     [provider]  sucralfate (CARAFATE) 1 g tablet Take 1 tablet (1 g total) by mouth 4 (four) times daily -  with meals and at bedtime. 5 min before meals for radiation induced esophagitis Patient not taking: Reported on 06/26/2022 06/20/22   [name], MD  traZODone (DESYREL) 50 MG tablet Take 50 mg by mouth at bedtime as needed for sleep.    [provider]    Physical Exam: Vitals:   07/14/22 1441 07/14/22 1500 07/14/22 1530 07/14/22 1600  BP:  128/74 120/66 109/71  Pulse:    81  Resp:  (!) 25 (!) 24 (!) 28  Temp:      TempSrc:      SpO2:  99% 100% 99%  Weight: 71.7 kg     Height: 5\' 3"  (1.6 m)      General: 76 y.o. female resting in bed in NAD Eyes: PERRL, normal sclera ENMT: Nares patent w/o discharge, orophaynx clear, dentition normal, ears w/o discharge/lesions/ulcers Neck: Supple, trachea midline Cardiovascular: tachy +S1, S2, no m/g/r, equal pulses throughout Respiratory: CTABL, no w/r/r, normal WOB GI: BS+, NDNT, no masses noted, no organomegaly noted MSK: No e/c/c Neuro: A&O x 3, no focal deficits Psyc: Appropriate interaction and affect, calm/cooperative  Data Reviewed:  Results for orders placed or performed in visit on 07/14/22 (from the past 24 hour(s))  CMP ([facility] only)     Status: Abnormal   Collection Time: 07/14/22 11:27 AM  Result Value Ref Range   Sodium 138 135 - 145 mmol/L   Potassium 4.3 3.5 - 5.1 mmol/L   Chloride 103 98 - 111 mmol/L   CO2 27 22 - 32 mmol/L   Glucose, Bld 115 (H) 70 - 99 mg/dL   BUN 23 8 - 23 mg/dL   Creatinine 0.95 0.44 - 1.00 mg/dL   Calcium 9.4 8.9 - 10.3 mg/dL   Total Protein 6.9 6.5 - 8.1 g/dL   Albumin 4.1 3.5 - 5.0 g/dL   AST 16 15 - 41 U/L   ALT 12 0 - 44 U/L   Alkaline Phosphatase 64 38 - 126 U/L   Total Bilirubin 2.1 (H) 0.3 - 1.2 mg/dL   GFR, Estimated >60 >60 mL/min   Anion gap 8 5 - 15  CBC with Differential  ([facility] Only)     Status: Abnormal   Collection Time: 07/14/22 11:27 AM  Result Value Ref Range   WBC Count 1.2 (L) 4.0 - 10.5 K/uL   RBC 3.82 (L) 3.87 - 5.11 MIL/uL   Hemoglobin 11.4 (L) 12.0 - 15.0 g/dL   HCT 33.2 (L) 36.0 - 46.0 %   MCV 86.9 80.0 - 100.0 fL   MCH 29.8 26.0 - 34.0 pg   MCHC 34.3 30.0 - 36.0 g/dL   RDW 16.7 (H) 11.5 - 15.5 %   Platelet Count 163 150 - 400 K/uL   nRBC 0.0 0.0 - 0.2 %  Neutrophils Relative % 80 %   Neutro Abs 1.0 (L) 1.7 - 7.7 K/uL   Lymphocytes Relative 11 %   Lymphs Abs 0.1 (L) 0.7 - 4.0 K/uL   Monocytes Relative 6 %   Monocytes Absolute 0.1 0.1 - 1.0 K/uL   Eosinophils Relative 0 %   Eosinophils Absolute 0.0 0.0 - 0.5 K/uL   Basophils Relative 1 %   Basophils Absolute 0.0 0.0 - 0.1 K/uL   Immature Granulocytes 2 %   Abs Immature Granulocytes 0.02 0.00 - 0.07 K/uL   CXR: Postsurgical changes inferior RIGHT hemithorax, stable. No acute intrathoracic abnormalities. Aortic Atherosclerosis (ICD10-I70.0).  Assessment and Plan: Dysphagia Nausea/Vomiting     - admit to inpt, SDU     - protonix, carafate     - SLP for swallow study     - spoke with [facility], they will follow, appreciate assistance     - NPO for now     - compazine  A fib     - new onset     - fluids, dilt gtt, lovenox      - EDP spoke with cards and they recommended cardizem and eliquis, but she is having difficulty swallowing, so we will initiate the gtt     - when she is tolerating PO, we can transition and get her set up with outpt follow up     - check echo  Anxiety     - resume home regimen when tolerating PO  NSCLC     - continue follow up with Dr. Earlie Server  COPD     - continue home regimen  HLD     - continue home regimen  GERD     - protonix  Advance Care Planning:   Code Status: FULL  Consults: [facility] (Dr. Randel Pigg), [facility] (Dr. Marin Olp)  Family Communication: w/ husband at bedside  Severity of Illness: The appropriate patient status for  this patient is INPATIENT. Inpatient status is judged to be reasonable and necessary in order to provide the required intensity of service to ensure the patient's safety. The patient's presenting symptoms, physical exam findings, and initial radiographic and laboratory data in the context of their chronic comorbidities is felt to place them at high risk for further clinical deterioration. Furthermore, it is not anticipated that the patient will be medically stable for discharge from the hospital within 2 midnights of admission.   * I certify that at the point of admission it is my clinical judgment that the patient will require inpatient hospital care spanning beyond 2 midnights from the point of admission due to high intensity of service, high risk for further deterioration and high frequency of surveillance required.*  Time spent in coordination of this H&P: 50 minutes  Author: Jonnie Finner, DO 07/14/2022 4:11 PM  For on call review [email or web address].

## 2022-07-14 NOTE — ED Provider Notes (Signed)
[facility] DEPT Provider Note   CSN: [id number] Arrival date & time: 07/14/22  1355     History  Chief Complaint  Patient presents with   Shortness of Breath    Amy [name] is a 76 y.o. female.  Patient complains of weakness.  Patient has lung cancer and is getting chemo and radiation.  She was seen over the clinic today and was given some fluids.  She states her weakness is better but she is in atrial fibs now which is new onset  The history is provided by the patient and medical records. No language interpreter was used.  Weakness Severity:  Moderate Onset quality:  Sudden Timing:  Constant Progression:  Waxing and waning Chronicity:  New Context: not alcohol use   Relieved by:  Nothing Worsened by:  Nothing Ineffective treatments:  None tried Associated symptoms: abdominal pain   Associated symptoms: no chest pain, no cough, no diarrhea, no frequency, no headaches and no seizures        Home Medications Prior to Admission medications   Medication Sig Start Date End Date Taking? Authorizing Provider  ADVAIR DISKUS 250-50 MCG/ACT AEPB Inhale 1 puff into the lungs 2 (two) times daily. 05/28/22   [provider]  albuterol (VENTOLIN HFA) 108 (90 Base) MCG/ACT inhaler Inhale 2 puffs into the lungs every 6 (six) hours as needed for wheezing or shortness of breath. Patient not taking: Reported on 06/26/2022    [provider]  HYDROcodone bit-homatropine (HYCODAN) 5-1.5 MG/5ML syrup Take 5 mLs by mouth every 6 (six) hours as needed for cough. 06/26/22   Curt Bears, MD  ipratropium (ATROVENT) 0.06 % nasal spray Place 2 sprays into both nostrils daily as needed for rhinitis. Patient not taking: Reported on 06/26/2022 12/19/17   [provider]  Multiple Vitamins-Minerals (MULTIVITAMIN ADULT) CHEW Chew 1 each by mouth daily.    [provider]  omeprazole (PRILOSEC) 40 MG capsule Take 40 mg by mouth daily.     [provider]  OVER THE COUNTER MEDICATION Take 1 capsule by mouth daily. Linseed oil + Omega 3 combination supplement    [provider]  prochlorperazine (COMPAZINE) 10 MG tablet Take 1 tablet (10 mg total) by mouth every 6 (six) hours as needed for nausea or vomiting. 05/25/22   Curt Bears, MD  raloxifene (EVISTA) 60 MG tablet Take 60 mg by mouth daily.    [provider]  sertraline (ZOLOFT) 50 MG tablet Take 50 mg by mouth daily.    [provider]  simvastatin (ZOCOR) 40 MG tablet Take 40 mg by mouth every evening.     [provider]  sucralfate (CARAFATE) 1 g tablet Take 1 tablet (1 g total) by mouth 4 (four) times daily -  with meals and at bedtime. 5 min before meals for radiation induced esophagitis Patient not taking: Reported on 06/26/2022 06/20/22   [name], MD  traZODone (DESYREL) 50 MG tablet Take 50 mg by mouth at bedtime as needed for sleep.    [provider]      Allergies    Patient has no known allergies.    Review of Systems   Review of Systems  Constitutional:  Negative for appetite change and fatigue.  HENT:  Negative for congestion, ear discharge and sinus pressure.        Pain with swallowing  Eyes:  Negative for discharge.  Respiratory:  Negative for cough.   Cardiovascular:  Negative for chest  pain.  Gastrointestinal:  Positive for abdominal pain. Negative for diarrhea.  Genitourinary:  Negative for frequency and hematuria.  Musculoskeletal:  Negative for back pain.  Skin:  Negative for rash.  Neurological:  Positive for weakness. Negative for seizures and headaches.  Psychiatric/Behavioral:  Negative for hallucinations.     Physical Exam Updated Vital Signs BP 128/74 (BP Location: Right Arm)   Pulse (!) 106   Temp 98.6 F (37 C) (Oral)   Resp (!) 25   Ht 5\' 3"  (1.6 m)   Wt 71.7 kg   SpO2 99%   BMI 27.99 kg/m  Physical Exam Vitals and nursing note reviewed.  Constitutional:       Appearance: She is well-developed.  HENT:     Head: Normocephalic.     Mouth/Throat:     Mouth: Mucous membranes are moist.  Eyes:     General: No scleral icterus.    Conjunctiva/sclera: Conjunctivae normal.  Neck:     Thyroid: No thyromegaly.  Cardiovascular:     Rate and Rhythm: Normal rate and regular rhythm.     Heart sounds: No murmur heard.    No friction rub. No gallop.  Pulmonary:     Breath sounds: No stridor. No wheezing or rales.  Chest:     Chest wall: No tenderness.  Abdominal:     General: There is no distension.     Tenderness: There is no abdominal tenderness. There is no rebound.  Musculoskeletal:        General: Normal range of motion.     Cervical back: Neck supple.  Lymphadenopathy:     Cervical: No cervical adenopathy.  Skin:    Findings: No erythema or rash.  Neurological:     Mental Status: She is alert and oriented to person, place, and time.     Motor: No abnormal muscle tone.     Coordination: Coordination normal.  Psychiatric:        Behavior: Behavior normal.     ED Results / Procedures / Treatments   Labs (all labs ordered are listed, but only abnormal results are displayed) Labs Reviewed - No data to display  EKG None  Radiology DG [facility] 1 View  Result Date: 07/14/2022 CLINICAL DATA:  Nausea, vomiting, shortness of breath, weakness EXAM: PORTABLE CHEST 1 VIEW COMPARISON:  Portable exam 1418 hours compared to 05/22/2022 FINDINGS: Normal heart size, mediastinal contours, and pulmonary vascularity. Atherosclerotic calcification aorta. Postsurgical changes RIGHT lung base. Lungs otherwise clear. No pulmonary infiltrate, pleural effusion, or pneumothorax. Bones demineralized. IMPRESSION: Postsurgical changes inferior RIGHT hemithorax, stable. No acute intrathoracic abnormalities. Aortic Atherosclerosis (ICD10-I70.0). Electronically Signed   By: Lavonia Dana M.D.   On: 07/14/2022 14:25    Procedures Procedures    Medications  Ordered in ED Medications  diltiazem (CARDIZEM CD) 24 hr capsule 120 mg (has no administration in time range)  diltiazem (CARDIZEM) injection 10 mg (10 mg Intravenous Given 07/14/22 1427)    ED Course/ Medical Decision Making/ A&P  CRITICAL CARE Performed by: Milton Ferguson Total critical care time: 40 minutes Critical care time was exclusive of separately billable procedures and treating other patients. Critical care was necessary to treat or prevent imminent or life-threatening deterioration. Critical care was time spent personally by me on the following activities: development of treatment plan with patient and/or surrogate as well as nursing, discussions with consultants, evaluation of patient's response to treatment, examination of patient, obtaining history from patient or surrogate, ordering and performing treatments and interventions, ordering  and review of laboratory studies, ordering and review of radiographic studies, pulse oximetry and re-evaluation of patient's condition.    Patient with new onset atrial fibs.  It has been controlled with IV Cardizem.  I spoke with Dr. Johnsie Cancel and we will start the patient on Cardizem CD 120 mg once a day along with Eliquis 5 mg twice a day.  I spoke to the patient about the plan she states that she has not been able to swallow any liquids or pills for over a week now.  The patient may need to be admitted.  We are consulting oncology                         Medical Decision Making Amount and/or Complexity of Data Reviewed Radiology: ordered.  Risk Prescription drug management.   This patient presents to the ED for concern of weakness, this involves an extensive number of treatment options, and is a complaint that carries with it a high risk of complications and morbidity.  The differential diagnosis includes new onset atrial fibs, dehydration   Co morbidities that complicate the patient evaluation  Lung cancer   Additional history  obtained:  Additional history obtained from oncology [place] External records from outside source obtained and reviewed including hospital records   Lab Tests:  I Ordered, and personally interpreted labs.  The pertinent results include: CBC C and chemistries unremarkable   Imaging Studies ordered:  I ordered imaging studies including chest x-ray I independently visualized and interpreted imaging which showed no acute disease I agree with the radiologist interpretation   Cardiac Monitoring: / EKG:  The patient was maintained on a cardiac monitor.  I personally viewed and interpreted the cardiac monitored which showed an underlying rhythm of: Rapid atrial rhythm   Consultations Obtained:  I requested consultation with the cardiology,  and discussed lab and imaging findings as well as pertinent plan - they recommend: Starting Cardizem CD 120 mg and also Eliquis with follow-up as outpatient.  Patient states she cannot swallow although   Problem List / ED Course / Critical interventions / Medication management  Lung cancer new onset atrial fibs I ordered medication including Cardizem IV Reevaluation of the patient after these medicines showed that the patient improved I have reviewed the patients home medicines and have made adjustments as needed   Social Determinants of Health:  Lung cancer   Test / Admission - Considered:  No additional tests but disposition still to be determined      Patient with new onset atrial fibs and epigastric pain most likely esophagitis from radiation treatment.  Disposition will be determined by my colleague Dr. Melina Copa        Final Clinical Impression(s) / ED Diagnoses Final diagnoses:  None    Rx / DC Orders ED Discharge Orders     None         Milton Ferguson, MD 07/14/22 [phone]

## 2022-07-14 NOTE — ED Notes (Signed)
Per  ED Registration, Ta, Pt left bc of the wait.

## 2022-07-14 NOTE — ED Triage Notes (Signed)
Pt arrived via wheelchair from the [facility]. Has felt SOB all week. Found to be new onset afib in the office today. Denies any CP, or dizziness.

## 2022-07-14 NOTE — Telephone Encounter (Signed)
Patient called to cancel [facility] 9/29 due to feeling weak and nauseous. L1 notified.

## 2022-07-14 NOTE — Consult Note (Signed)
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  I cannot swallow.  HPI: [name] is a very charming 76 year old white female.  She is a patient of Dr. Worthy Flank.  She has a recurrent non small cell lung cancer.  He currently is undergoing radiation and low-dose chemotherapy.  She gets weekly chemotherapy with carboplatinum/Taxol.  I think she has had 5-6 treatments.  She is supposed to finish of the radiation therapy next week.  She was seen in the office today.  She was weak.  She cannot swallow.  She was found to be tachycardic.  She subsequently went to the emergency room.  She was found to be in atrial fibrillation.  Her CBC showed a white count of 1.2.  Hemoglobin 11.4.  Platelet count 163,000.  Her sodium is 138.  Potassium 4.3.  BUN 23 creatinine 0.95.  Calcium 9.4 with an albumin of 4.1.  She had a chest x-ray done.  This did not show any obvious infiltrate.  She had the regional changes in the right hemithorax.  She denies any obvious chest pain.  She just has odynophagia and dysphagia.  There is been no diarrhea.  She has had no bleeding.  There is no headache.  She has a hard time even swallowing water.  She cannot swallow any pills.  Currently, I would say performance status is probably ECOG 2.    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  Status post right lower lobe superior segmentectomy  :   Past Surgical History:  Procedure Laterality Date   APPENDECTOMY     EYE SURGERY      INTERCOSTAL NERVE BLOCK Right 03/17/2020   Procedure: Intercostal Nerve Block;  Surgeon: [name], MD;  Location: [facility];  Service: Thoracic;  Laterality: Right;   LUNG SURGERY Right 03/17/2020   NODE DISSECTION Right 03/17/2020   Procedure: Node Dissection;  Surgeon: [name], MD;  Location: [facility];  Service: Thoracic;  Laterality: Right;   TUBAL LIGATION     VIDEO BRONCHOSCOPY WITH ENDOBRONCHIAL ULTRASOUND N/A 05/22/2022   Procedure: VIDEO BRONCHOSCOPY WITH ENDOBRONCHIAL ULTRASOUND;  Surgeon: [name], MD;  Location: [facility];  Service: Thoracic;  Laterality: N/A;  :   Current Facility-Administered Medications:    0.9 %  sodium chloride infusion, , Intravenous, Continuous, Kyle, Tyrone A, DO   diltiazem (CARDIZEM) 125 mg in dextrose 5% 125 mL (1 mg/mL) infusion, 5-15 mg/hr, Intravenous, Titrated, Kyle, Tyrone A, DO   enoxaparin (LOVENOX) injection 72.5 mg, 1 mg/kg, Subcutaneous, Once, Hayden Rasmussen, MD   pantoprazole (PROTONIX) injection 40 mg, 40 mg, Intravenous, Once, Hayden Rasmussen, MD   [START ON 07/15/2022] pantoprazole (PROTONIX) injection 40 mg, 40 mg, Intravenous, Q12H, Kyle, Tyrone A, DO   prochlorperazine (COMPAZINE) injection 10 mg, 10 mg, Intravenous, Q6H PRN, Kyle, Tyrone A, DO   sodium chloride 0.9 % bolus 500 mL, 500 mL, Intravenous, Once, Hayden Rasmussen,  MD  Current Outpatient Medications:    ADVAIR DISKUS 250-50 MCG/ACT AEPB, Inhale 1 puff into the lungs 2 (two) times daily., Disp: , Rfl:    albuterol (VENTOLIN HFA) 108 (90 Base) MCG/ACT inhaler, Inhale 2 puffs into the lungs every 6 (six) hours as needed for wheezing or shortness of breath. (Patient not taking: Reported on 06/26/2022), Disp: , Rfl:    HYDROcodone bit-homatropine (HYCODAN) 5-1.5 MG/5ML syrup, Take 5 mLs by mouth every 6 (six) hours as needed for cough., Disp: 120 mL, Rfl: 0   ipratropium (ATROVENT) 0.06 % nasal spray, Place 2 sprays into both nostrils daily as needed  for rhinitis. (Patient not taking: Reported on 06/26/2022), Disp: , Rfl:    Multiple Vitamins-Minerals (MULTIVITAMIN ADULT) CHEW, Chew 1 each by mouth daily., Disp: , Rfl:    omeprazole (PRILOSEC) 40 MG capsule, Take 40 mg by mouth daily., Disp: , Rfl:    OVER THE COUNTER MEDICATION, Take 1 capsule by mouth daily. Linseed oil + Omega 3 combination supplement, Disp: , Rfl:    prochlorperazine (COMPAZINE) 10 MG tablet, Take 1 tablet (10 mg total) by mouth every 6 (six) hours as needed for nausea or vomiting., Disp: 30 tablet, Rfl: 0   raloxifene (EVISTA) 60 MG tablet, Take 60 mg by mouth daily., Disp: , Rfl:    sertraline (ZOLOFT) 50 MG tablet, Take 50 mg by mouth daily., Disp: , Rfl:    simvastatin (ZOCOR) 40 MG tablet, Take 40 mg by mouth every evening. , Disp: , Rfl:    sucralfate (CARAFATE) 1 g tablet, Take 1 tablet (1 g total) by mouth 4 (four) times daily -  with meals and at bedtime. 5 min before meals for radiation induced esophagitis (Patient not taking: Reported on 06/26/2022), Disp: 120 tablet, Rfl: 2   traZODone (DESYREL) 50 MG tablet, Take 50 mg by mouth at bedtime as needed for sleep., Disp: , Rfl: :   enoxaparin (LOVENOX) injection  1 mg/kg Subcutaneous Once   pantoprazole (PROTONIX) IV  40 mg Intravenous Once   [START ON 07/15/2022] pantoprazole (PROTONIX) IV  40 mg Intravenous Q12H  :  No Known Allergies:  History reviewed. No pertinent family history.:   Social History   Socioeconomic History   Marital status: Married    Spouse name: Not on file   Number of children: Not on file   Years of education: Not on file   Highest education level: Not on file  Occupational History   Not on file  Tobacco Use   Smoking status: Former    Packs/day: 1.00    Years: 55.00    Total pack years: 55.00    Types: Cigarettes    Quit date: 03/25/2020    Years since quitting: 2.3   Smokeless tobacco: Never  Vaping Use   Vaping Use: Not on file  Substance and Sexual Activity   Alcohol  use: Yes    Comment: Social with wine   Drug use: Never   Sexual activity: Not on file  Other Topics Concern   Not on file  Social History Narrative   Not on file   Social Determinants of Health   Financial Resource Strain: Not on file  Food Insecurity: No Food Insecurity (07/14/2022)   Hunger Vital Sign    Worried About Running Out of Food in the Last Year: Never true    Ran Out of Food in the Last Year: Never true  Transportation Needs: No Transportation Needs (07/14/2022)   [place] - Transportation  Lack of Transportation (Medical): No    Lack of Transportation (Non-Medical): No  Physical Activity: Not on file  Stress: Not on file  Social Connections: Not on file  Intimate Partner Violence: Not on file  :  Review of Systems  Constitutional:  Positive for malaise/fatigue.  HENT:  Positive for sore throat.   Eyes: Negative.   Respiratory: Negative.    Cardiovascular:  Positive for palpitations.  Gastrointestinal:  Positive for heartburn.  Genitourinary: Negative.   Musculoskeletal: Negative.   Skin: Negative.   Neurological: Negative.   Endo/Heme/Allergies: Negative.   Psychiatric/Behavioral: Negative.       Exam: Patient Vitals for the past 24 hrs:  BP Temp Temp src Pulse Resp SpO2 Height Weight  07/14/22 1700 120/82 -- -- (!) 111 19 99 % -- --  07/14/22 1630 100/71 -- -- -- (!) 21 -- -- --  07/14/22 1600 109/71 -- -- 81 (!) 28 99 % -- --  07/14/22 1530 120/66 -- -- -- (!) 24 100 % -- --  07/14/22 1500 128/74 -- -- -- (!) 25 99 % -- --  07/14/22 1441 -- -- -- -- -- -- 5\' 3"  (1.6 m) 158 lb (71.7 kg)  07/14/22 1401 123/88 98.6 F (37 C) Oral (!) 106 18 99 % -- --   Physical Exam Vitals reviewed.  HENT:     Head: Normocephalic and atraumatic.  Eyes:     Pupils: Pupils are equal, round, and reactive to light.  Cardiovascular:     Rate and Rhythm: Normal rate and regular rhythm.     Heart sounds: Normal heart sounds.     Comments: Cardiac exam is  tachycardic and irregular consistent with atrial fibrillation.  She has no murmurs. Pulmonary:     Effort: Pulmonary effort is normal.     Breath sounds: Normal breath sounds.     Comments: Lungs sound pretty clear bilaterally.  I do not hear any wheezing.  She has good breath sounds bilaterally. Abdominal:     General: Bowel sounds are normal.     Palpations: Abdomen is soft.  Musculoskeletal:        General: No tenderness or deformity. Normal range of motion.     Cervical back: Normal range of motion.  Lymphadenopathy:     Cervical: No cervical adenopathy.  Skin:    General: Skin is warm and dry.     Findings: No erythema or rash.  Neurological:     Mental Status: She is alert and oriented to person, place, and time.  Psychiatric:        Behavior: Behavior normal.        Thought Content: Thought content normal.        Judgment: Judgment normal.     Recent Labs    07/14/22 1127  WBC 1.2*  HGB 11.4*  HCT 33.2*  PLT 163    Recent Labs    07/14/22 1127  NA 138  K 4.3  CL 103  CO2 27  GLUCOSE 115*  BUN 23  CREATININE 0.95  CALCIUM 9.4    Blood smear review: None  Pathology: None    Assessment and Plan: Amy [name] is a very charming 76 year old white female.  She has a locally recurrent squamous cell carcinoma of the lung.  She is getting radiation and chemotherapy.  She is close to finishing treatment.  She says that she has 4 more radiation treatments left.    She now has the atrial fibrillation.  This could certainly  be from the stress of her treatments.  I do not think that she has any thyroid issues.  I do not think there is any problem with pulmonary embolism.  I am sure that Cardiology will see her and decide about treating the atrial fibrillation.  I probably would put her on anticoagulation.  I think this probably would not be a bad idea given that she does have underlying malignancy.  I think we are going to have to use Lovenox.  She cannot  swallow right now.  I will go ahead and try her on some viscous lidocaine.  We will see if this can help with the odynophagia.  She also has an element of neutropenia.  I probably would give her a dose of Neupogen.  I think her white cell count does need to get up a little bit more.  I told her that she and her husband that I really do not think that she has to finish up her treatments.  I think she has just 1 more chemotherapy and for more radiation treatments.  I think given her situation, particularly with the leukopenia, I am unsure if she could handle any further treatments.  I do not think she needs an upper endoscopy to look at the esophagus.  We will follow along.  I must say that she is very charming to talk to.  Both she and her husband are incredibly knowledgeable and very eloquent.  [name], MD  Darlyn Chamber 29:11

## 2022-07-15 ENCOUNTER — Inpatient Hospital Stay (HOSPITAL_COMMUNITY): Payer: Medicare HMO

## 2022-07-15 ENCOUNTER — Other Ambulatory Visit: Payer: Self-pay

## 2022-07-15 DIAGNOSIS — C3490 Malignant neoplasm of unspecified part of unspecified bronchus or lung: Secondary | ICD-10-CM | POA: Diagnosis not present

## 2022-07-15 DIAGNOSIS — D61818 Other pancytopenia: Secondary | ICD-10-CM | POA: Clinically undetermined

## 2022-07-15 DIAGNOSIS — T66XXXD Radiation sickness, unspecified, subsequent encounter: Secondary | ICD-10-CM

## 2022-07-15 DIAGNOSIS — K209 Esophagitis, unspecified without bleeding: Secondary | ICD-10-CM | POA: Diagnosis not present

## 2022-07-15 DIAGNOSIS — R1310 Dysphagia, unspecified: Secondary | ICD-10-CM | POA: Diagnosis not present

## 2022-07-15 DIAGNOSIS — E785 Hyperlipidemia, unspecified: Secondary | ICD-10-CM

## 2022-07-15 DIAGNOSIS — J449 Chronic obstructive pulmonary disease, unspecified: Secondary | ICD-10-CM | POA: Diagnosis not present

## 2022-07-15 DIAGNOSIS — I4891 Unspecified atrial fibrillation: Secondary | ICD-10-CM

## 2022-07-15 DIAGNOSIS — C3431 Malignant neoplasm of lower lobe, right bronchus or lung: Secondary | ICD-10-CM

## 2022-07-15 DIAGNOSIS — K2101 Gastro-esophageal reflux disease with esophagitis, with bleeding: Secondary | ICD-10-CM

## 2022-07-15 LAB — CBC WITH DIFFERENTIAL/PLATELET
Abs Immature Granulocytes: 0.04 10*3/uL (ref 0.00–0.07)
Basophils Absolute: 0 10*3/uL (ref 0.0–0.1)
Basophils Relative: 0 %
Eosinophils Absolute: 0 10*3/uL (ref 0.0–0.5)
Eosinophils Relative: 1 %
HCT: 25.6 % — ABNORMAL LOW (ref 36.0–46.0)
Hemoglobin: 8.3 g/dL — ABNORMAL LOW (ref 12.0–15.0)
Immature Granulocytes: 5 %
Lymphocytes Relative: 14 %
Lymphs Abs: 0.1 10*3/uL — ABNORMAL LOW (ref 0.7–4.0)
MCH: 29.6 pg (ref 26.0–34.0)
MCHC: 32.4 g/dL (ref 30.0–36.0)
MCV: 91.4 fL (ref 80.0–100.0)
Monocytes Absolute: 0.1 10*3/uL (ref 0.1–1.0)
Monocytes Relative: 10 %
Neutro Abs: 0.6 10*3/uL — ABNORMAL LOW (ref 1.7–7.7)
Neutrophils Relative %: 70 %
Platelets: 118 10*3/uL — ABNORMAL LOW (ref 150–400)
RBC: 2.8 MIL/uL — ABNORMAL LOW (ref 3.87–5.11)
RDW: 16.8 % — ABNORMAL HIGH (ref 11.5–15.5)
WBC: 0.9 10*3/uL — CL (ref 4.0–10.5)
nRBC: 0 % (ref 0.0–0.2)

## 2022-07-15 LAB — ECHOCARDIOGRAM COMPLETE
AR max vel: 2.61 cm2
AV Peak grad: 11.5 mmHg
Ao pk vel: 1.7 m/s
Height: 63 in
S' Lateral: 1.4 cm
Weight: 2585.55 oz

## 2022-07-15 LAB — COMPREHENSIVE METABOLIC PANEL
ALT: 13 U/L (ref 0–44)
AST: 16 U/L (ref 15–41)
Albumin: 2.9 g/dL — ABNORMAL LOW (ref 3.5–5.0)
Alkaline Phosphatase: 44 U/L (ref 38–126)
Anion gap: 5 (ref 5–15)
BUN: 22 mg/dL (ref 8–23)
CO2: 24 mmol/L (ref 22–32)
Calcium: 8.3 mg/dL — ABNORMAL LOW (ref 8.9–10.3)
Chloride: 110 mmol/L (ref 98–111)
Creatinine, Ser: 0.8 mg/dL (ref 0.44–1.00)
GFR, Estimated: >60 mL/min (ref >60)
Glucose, Bld: 86 mg/dL (ref 70–99)
Potassium: 3.7 mmol/L (ref 3.5–5.1)
Sodium: 139 mmol/L (ref 135–145)
Total Bilirubin: 1.8 mg/dL — ABNORMAL HIGH (ref 0.3–1.2)
Total Protein: 5 g/dL — ABNORMAL LOW (ref 6.5–8.1)

## 2022-07-15 LAB — MAGNESIUM: Magnesium: 1.7 mg/dL (ref 1.7–2.4)

## 2022-07-15 LAB — MRSA NEXT GEN BY PCR, NASAL: MRSA by PCR Next Gen: NOT DETECTED

## 2022-07-15 MED ORDER — TBO-FILGRASTIM 480 MCG/0.8ML ~~LOC~~ SOSY
480.0000 ug | PREFILLED_SYRINGE | Freq: Every day | SUBCUTANEOUS | Status: DC
  Administered 2022-07-15 – 2022-07-17 (×3): 480 ug via SUBCUTANEOUS
  Filled 2022-07-15 (×4): qty 0.8

## 2022-07-15 MED ORDER — ORAL CARE MOUTH RINSE
15.0000 mL | OROMUCOSAL | Status: DC
  Administered 2022-07-15 – 2022-07-20 (×8): 15 mL via OROMUCOSAL

## 2022-07-15 MED ORDER — ALBUMIN HUMAN 25 % IV SOLN
25.0000 g | Freq: Four times a day (QID) | INTRAVENOUS | Status: AC
  Administered 2022-07-15 – 2022-07-16 (×5): 25 g via INTRAVENOUS
  Filled 2022-07-15 (×5): qty 100

## 2022-07-15 MED ORDER — SUCRALFATE 1 GM/10ML PO SUSP
1.0000 g | Freq: Four times a day (QID) | ORAL | Status: DC
  Administered 2022-07-15 (×3): 1 g via ORAL
  Filled 2022-07-15 (×3): qty 10

## 2022-07-15 MED ORDER — MAGNESIUM SULFATE 2 GM/50ML IV SOLN
2.0000 g | Freq: Once | INTRAVENOUS | Status: AC
  Administered 2022-07-15: 2 g via INTRAVENOUS
  Filled 2022-07-15: qty 50

## 2022-07-15 MED ORDER — BOOST / RESOURCE BREEZE PO LIQD CUSTOM
1.0000 | Freq: Three times a day (TID) | ORAL | Status: DC
  Administered 2022-07-15: 1 via ORAL

## 2022-07-15 MED ORDER — ORAL CARE MOUTH RINSE: 15.0000 mL | OROMUCOSAL | Status: DC | PRN

## 2022-07-15 MED ORDER — MOMETASONE FURO-FORMOTEROL FUM 200-5 MCG/ACT IN AERO
2.0000 | INHALATION_SPRAY | Freq: Two times a day (BID) | RESPIRATORY_TRACT | Status: DC
  Administered 2022-07-15 – 2022-07-20 (×10): 2 via RESPIRATORY_TRACT
  Filled 2022-07-15: qty 8.8

## 2022-07-15 MED ORDER — HYDROCODONE-ACETAMINOPHEN 7.5-325 MG/15ML PO SOLN: 10.0000 mL | ORAL | Status: DC | PRN

## 2022-07-15 MED ORDER — CHLORHEXIDINE GLUCONATE CLOTH 2 % EX PADS
6.0000 | MEDICATED_PAD | Freq: Every day | CUTANEOUS | Status: DC
  Administered 2022-07-15 – 2022-07-19 (×4): 6 via TOPICAL

## 2022-07-15 NOTE — Consult Note (Signed)
[facility] Gastroenterology Consult  Referring Provider: No ref. provider found Primary Care Physician:  [name], MD Primary Gastroenterologist: Dr. Rosalie Gums  Reason for Consultation: dysphagia  SUBJECTIVE:   HPI: Amy [name] is a 76 y.o. 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 She is currently undergoing chemotherapy (last dose during past week) and radiation (last round on 07/13/22). She notes having a roughly 2 week history of dysphagia to both solids and liquids. She is unable to swallow pills. It appears that she is tolerating her secretions well. She presented to the hospital for further evaluation of this issue.   Labs on presentation showed Na 138, K 4.3, AST/ALT 16/12, total bilirubin 2.1, WBC 1.2, Hgb 11.4, PLT 163. A chest xray was completed showing stable postsurgical right hemithorax, no acute intrathoracic abnormalities.   She has been seen and evaluated by gastroenterology in the past. She saw Dr. Rosalie Gums in the GI office on 02/2021 for dysphagia. At that time an EGD was offered, though patient declined in favor of less invasive barium swallow evaluation. This was completed on 03/08/21 and showed moderate esophageal dysmotility with impaired progression through mid esophagus, small sliding hiatal hernia, no stricture. She subsequently underwent colonoscopy on 05/19/21 with Dr. Rosalie Gums for personal history of colon polyps with findings of skin tags on peri-anal exam, sigmoid tubular adenoma, cecum tubular adenoma, rectal tubular adenoma polyp and hyperplastic rectum polyp.   Today, she denied any chest pain or shortness of breath. Spouse at bedside says she will get dyspneic on exertion. She has no specific abdominal pain. She has nausea with no vomiting. She has some recent diarrhea, though no melena or hematochezia. Of note, she was diagnosed with new onset atrial fibrillation  and is undergoing therapy. Oncology is consulted, CT chest with contrast is ordered and pending.  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  Status post right lower lobe superior segmentectomy   Past Surgical History:  Procedure Laterality Date   APPENDECTOMY     EYE SURGERY     INTERCOSTAL NERVE BLOCK Right 03/17/2020   Procedure: Intercostal Nerve Block;  Surgeon: [name], MD;  Location: [place];  Service: Thoracic;  Laterality: Right;   LUNG SURGERY Right 03/17/2020   NODE DISSECTION Right 03/17/2020   Procedure: Node Dissection;  Surgeon: [name], MD;  Location: [place];  Service: Thoracic;  Laterality: Right;   TUBAL LIGATION     VIDEO BRONCHOSCOPY WITH ENDOBRONCHIAL ULTRASOUND N/A 05/22/2022   Procedure: VIDEO BRONCHOSCOPY WITH ENDOBRONCHIAL ULTRASOUND;  Surgeon: [name], MD;  Location: [place];  Service: Thoracic;  Laterality: N/A;   Prior to Admission medications   Medication Sig Start Date End Date Taking? Authorizing Provider  acetaminophen (TYLENOL) 500 MG tablet Take 500-1,000 mg by mouth every 6 (six) hours as needed for mild pain or headache.   Yes [provider]  ADVAIR DISKUS 250-50 MCG/ACT AEPB Inhale 1 puff into the lungs 2 (two) times daily. 05/28/22  Yes [provider]  albuterol (VENTOLIN HFA) 108 (90 Base) MCG/ACT  inhaler Inhale 2 puffs into the lungs every 6 (six) hours as needed for wheezing or shortness of breath.   Yes [provider]  Multiple Vitamins-Minerals (MULTIVITAMIN ADULT) CHEW Chew 1 each by mouth daily.   Yes [provider]  omeprazole (PRILOSEC) 40 MG capsule Take 40  mg by mouth daily before breakfast.   Yes [provider]  OVER THE COUNTER MEDICATION Take 1 capsule by mouth daily. Linseed oil + Omega 3 combination supplement- Take 1 capsule by mouth once a day   Yes [provider]  prochlorperazine (COMPAZINE) 10 MG tablet Take 1 tablet (10 mg total) by mouth every 6 (six) hours as needed for nausea or vomiting. 05/25/22  Yes Curt Bears, MD  raloxifene (EVISTA) 60 MG tablet Take 60 mg by mouth daily.   Yes [provider]  sertraline (ZOLOFT) 50 MG tablet Take 50 mg by mouth daily.   Yes [provider]  simvastatin (ZOCOR) 40 MG tablet Take 40 mg by mouth every evening.    Yes [provider]  traZODone (DESYREL) 50 MG tablet Take 25 mg by mouth See admin instructions. Take one to two 25 mg half-tablets by mouth at bedtime   Yes [provider]  HYDROcodone bit-homatropine (HYCODAN) 5-1.5 MG/5ML syrup Take 5 mLs by mouth every 6 (six) hours as needed for cough. Patient not taking: Reported on 07/14/2022 06/26/22   Curt Bears, MD  sucralfate (CARAFATE) 1 g tablet Take 1 tablet (1 g total) by mouth 4 (four) times daily -  with meals and at bedtime. 5 min before meals for radiation induced esophagitis Patient not taking: Reported on 06/26/2022 06/20/22   [name], MD   Current Facility-Administered Medications  Medication Dose Route Frequency Provider Last Rate Last Admin   0.9 %  sodium chloride infusion   Intravenous Continuous Eugenie Filler, MD 100 mL/hr at 07/15/22 0853 Rate Change at 07/15/22 0853   albumin human 25 % solution 25 g  25 g Intravenous Q6H Eugenie Filler, MD 60 mL/hr at 07/15/22 1157 25 g at 07/15/22 1157   Chlorhexidine Gluconate Cloth 2 % PADS 6 each  6 each Topical Daily Eugenie Filler, MD       diltiazem (CARDIZEM) 125 mg in dextrose 5% 125 mL (1 mg/mL) infusion  5-15 mg/hr Intravenous Titrated Kyle, Tyrone A, DO 5 mL/hr at 07/15/22 0651 5 mg/hr at 07/15/22  0651   enoxaparin (LOVENOX) injection 70 mg  70 mg Subcutaneous Q12H Adrian Saran, RPH   70 mg at 07/15/22 0847   feeding supplement (BOOST / RESOURCE BREEZE) liquid 1 Container  1 Container Oral TID BM Volanda Napoleon, MD   1 Container at 07/15/22 0950   HYDROcodone-acetaminophen (HYCET) 7.5-325 mg/15 ml solution 10 mL  10 mL Oral Q4H PRN Volanda Napoleon, MD       magnesium sulfate IVPB 2 g 50 mL  2 g Intravenous Once Eugenie Filler, MD 50 mL/hr at 07/15/22 1209 2 g at 07/15/22 1209   Oral care mouth rinse  15 mL Mouth Rinse 4 times per day Eugenie Filler, MD   15 mL at 07/15/22 1210   Oral care mouth rinse  15 mL Mouth Rinse PRN Eugenie Filler, MD       pantoprazole (PROTONIX) injection 40 mg  40 mg Intravenous Q12H Kyle, Tyrone A, DO   40 mg at 07/15/22 0847   prochlorperazine (COMPAZINE) injection 10 mg  10 mg Intravenous Q6H  PRN Cherylann Ratel A, DO   10 mg at 07/15/22 0059   sucralfate (CARAFATE) 1 GM/10ML suspension 1 g  1 g Oral Q6H Loney Laurence, DO       [facility]) injection 480 mcg  480 mcg Subcutaneous q1800 Volanda Napoleon, MD       Allergies as of 07/14/2022   (No Known Allergies)   History reviewed. No pertinent family history. Social History   Socioeconomic History   Marital status: Married    Spouse name: Not on file   Number of children: Not on file   Years of education: Not on file   Highest education level: Not on file  Occupational History   Not on file  Tobacco Use   Smoking status: Former    Packs/day: 1.00    Years: 55.00    Total pack years: 55.00    Types: Cigarettes    Quit date: 03/25/2020    Years since quitting: 2.3   Smokeless tobacco: Never  Vaping Use   Vaping Use: Not on file  Substance and Sexual Activity   Alcohol use: Yes    Comment: Social with wine   Drug use: Never   Sexual activity: Not on file  Other Topics Concern   Not on file  Social History Narrative   Not on file   Social Determinants of  Health   Financial Resource Strain: Not on file  Food Insecurity: No Food Insecurity (07/14/2022)   Hunger Vital Sign    Worried About Running Out of Food in the Last Year: Never true    Ran Out of Food in the Last Year: Never true  Transportation Needs: No Transportation Needs (07/14/2022)   PRAPARE - Hydrologist (Medical): No    Lack of Transportation (Non-Medical): No  Physical Activity: Not on file  Stress: Not on file  Social Connections: Not on file  Intimate Partner Violence: Not At Risk (07/14/2022)   Humiliation, Afraid, Rape, and Kick questionnaire    Fear of Current or Ex-Partner: No    Emotionally Abused: No    Physically Abused: No    Sexually Abused: No   Review of Systems:  Review of Systems  Respiratory:  Positive for shortness of breath.   Cardiovascular:  Negative for chest pain.  Gastrointestinal:  Positive for diarrhea and nausea. Negative for abdominal pain, blood in stool, melena and vomiting.       Dysphagia, odynophagia    OBJECTIVE:   Temp:  [98.1 F (36.7 C)-99.4 F (37.4 C)] 98.6 F (37 C) (09/30 1154) Pulse Rate:  [81-119] 106 (09/30 0500) Resp:  [18-33] 25 (09/30 0500) BP: (99-142)/(39-88) 113/47 (09/30 0500) SpO2:  [90 %-100 %] 95 % (09/30 0500) Weight:  [71.7 kg-73.3 kg] 73.3 kg (09/29 1908) Last BM Date :  (PTA) Physical Exam Constitutional:      General: She is not in acute distress.    Appearance: She is not ill-appearing, toxic-appearing or diaphoretic.  Cardiovascular:     Rate and Rhythm: Rhythm irregular.  Pulmonary:     Effort: Pulmonary effort is normal.     Breath sounds: Normal breath sounds.  Abdominal:     General: Bowel sounds are normal.     Palpations: Abdomen is soft.     Tenderness: There is no abdominal tenderness.  Skin:    General: Skin is warm and dry.  Neurological:     Mental Status: She is alert.     Labs: Recent  Labs    07/14/22 1127 07/15/22 0315  WBC 1.2* 0.9*  HGB  11.4* 8.3*  HCT 33.2* 25.6*  PLT 163 118*   BMET Recent Labs    07/14/22 1127 07/15/22 0315  NA 138 139  K 4.3 3.7  CL 103 110  CO2 27 24  GLUCOSE 115* 86  BUN 23 22  CREATININE 0.95 0.80  CALCIUM 9.4 8.3*   LFT Recent Labs    07/15/22 0315  PROT 5.0*  ALBUMIN 2.9*  AST 16  ALT 13  ALKPHOS 44  BILITOT 1.8*   PT/INR No results for input(s): "LABPROT", "INR" in the last 72 hours.  Diagnostic imaging: ECHOCARDIOGRAM COMPLETE  Result Date: 07/15/2022    ECHOCARDIOGRAM REPORT   Patient Name:   Amy [name] Date of Exam: 07/15/2022 Medical Rec #:  [id number]        Height:       63.0 in Accession #:    [id number]       Weight:       161.6 lb Date of Birth:  1946-04-06        BSA:          1.766 m Patient Age:    34 years         BP:           113/47 mmHg Patient Gender: F                HR:           96 bpm. Exam Location:  Inpatient Procedure: 2D Echo, Cardiac Doppler and Color Doppler Indications:    Atrial fibrillation  History:        Patient has no prior history of Echocardiogram examinations.                 COPD; Risk Factors:Hypertension and Dyslipidemia.  Sonographer:    Jefferey Pica Referring Phys: [id number] Jonnie Finner  Sonographer Comments: Image acquisition challenging due to respiratory motion. IMPRESSIONS  1. Left ventricular ejection fraction, by estimation, is >75%. The left ventricle has hyperdynamic function. The left ventricle has no regional wall motion abnormalities. Left ventricular diastolic function could not be evaluated.  2. Right ventricular systolic function was not well visualized. The right ventricular size is not well visualized. Tricuspid regurgitation signal is inadequate for assessing PA pressure.  3. The mitral valve is grossly normal. No evidence of mitral valve regurgitation.  4. The aortic valve is tricuspid. Aortic valve regurgitation is not visualized. Aortic valve sclerosis is present, with no evidence of aortic valve stenosis.  5. The  inferior vena cava is normal in size with greater than 50% respiratory variability, suggesting right atrial pressure of 3 mmHg. Comparison(s): No prior Echocardiogram. FINDINGS  Left Ventricle: Left ventricular ejection fraction, by estimation, is >75%. The left ventricle has hyperdynamic function. The left ventricle has no regional wall motion abnormalities. The left ventricular internal cavity size was normal in size. There is no left ventricular hypertrophy. Left ventricular diastolic function could not be evaluated due to atrial fibrillation. Left ventricular diastolic function could not be evaluated. Right Ventricle: The right ventricular size is not well visualized. Right vetricular wall thickness was not well visualized. Right ventricular systolic function was not well visualized. Tricuspid regurgitation signal is inadequate for assessing PA pressure. Left Atrium: Left atrial size was normal in size. Right Atrium: Right atrial size was normal in size. Pericardium: There is no evidence of pericardial effusion. Mitral Valve: The mitral valve is  grossly normal. No evidence of mitral valve regurgitation. Tricuspid Valve: The tricuspid valve is grossly normal. Tricuspid valve regurgitation is trivial. Aortic Valve: The aortic valve is tricuspid. Aortic valve regurgitation is not visualized. Aortic valve sclerosis is present, with no evidence of aortic valve stenosis. Aortic valve peak gradient measures 11.5 mmHg. Pulmonic Valve: The pulmonic valve was normal in structure. Pulmonic valve regurgitation is not visualized. Aorta: The aortic root and ascending aorta are structurally normal, with no evidence of dilitation. Venous: The inferior vena cava is normal in size with greater than 50% respiratory variability, suggesting right atrial pressure of 3 mmHg. IAS/Shunts: No atrial level shunt detected by color flow Doppler.  LEFT VENTRICLE PLAX 2D LVIDd:         4.10 cm LVIDs:         1.40 cm LV PW:         0.90 cm LV  IVS:        1.00 cm LVOT diam:     2.00 cm LV SV:         76 LV SV Index:   43 LVOT Area:     3.14 cm  IVC IVC diam: 1.30 cm LEFT ATRIUM             Index        RIGHT ATRIUM           Index LA diam:        3.70 cm 2.09 cm/m   RA Area:     12.30 cm LA Vol (A2C):   23.6 ml 13.36 ml/m  RA Volume:   29.60 ml  16.76 ml/m LA Vol (A4C):   46.3 ml 26.22 ml/m LA Biplane Vol: 35.0 ml 19.82 ml/m  AORTIC VALVE                 PULMONIC VALVE AV Area (Vmax): 2.61 cm     PV Vmax:       1.18 m/s AV Vmax:        169.75 cm/s  PV Peak grad:  5.6 mmHg AV Peak Grad:   11.5 mmHg LVOT Vmax:      141.00 cm/s LVOT Vmean:     89.650 cm/s LVOT VTI:       0.242 m  AORTA Ao Root diam: 3.10 cm Ao Asc diam:  2.90 cm  SHUNTS Systemic VTI:  0.24 m Systemic Diam: 2.00 cm Lyman Bishop MD Electronically signed by Lyman Bishop MD Signature Date/Time: 07/15/2022/12:18:29 PM    Final    DG Chest Port 1 View  Result Date: 07/14/2022 CLINICAL DATA:  Nausea, vomiting, shortness of breath, weakness EXAM: PORTABLE CHEST 1 VIEW COMPARISON:  Portable exam 1418 hours compared to 05/22/2022 FINDINGS: Normal heart size, mediastinal contours, and pulmonary vascularity. Atherosclerotic calcification aorta. Postsurgical changes RIGHT lung base. Lungs otherwise clear. No pulmonary infiltrate, pleural effusion, or pneumothorax. Bones demineralized. IMPRESSION: Postsurgical changes inferior RIGHT hemithorax, stable. No acute intrathoracic abnormalities. Aortic Atherosclerosis (ICD10-I70.0). Electronically Signed   By: Lavonia Dana M.D.   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 - She appears to be too high risk at  this time to endoscopically evaluate her esophagus at this time -  Follow up results of CT chest with contrast - Acknowledge Oncology recommendations for Vicodin elixir - Recommend carafate suspension 1 gm PO Q6Hr - Recommend IV PPI Q12Hr - Undetermined length of time that esophageal symptoms will improve, may require long term feeding option which was discussed with family (NG tube placed under endoscopic visualization, PEG tube, TPN) though will allow for medication therapy prior to making such decision - Ok for clear liquids as tolerated - GI will follow   LOS: 1 day   Danton Clap, DO [facility]

## 2022-07-15 NOTE — Evaluation (Signed)
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  Status post right lower lobe superior segmentectomy   Past Surgical History:  Past Surgical History:  Procedure Laterality Date   APPENDECTOMY     EYE SURGERY     INTERCOSTAL NERVE BLOCK Right 03/17/2020   Procedure: Intercostal Nerve Block;  Surgeon: [name], MD;  Location: [place];  Service: Thoracic;  Laterality: Right;   LUNG SURGERY Right 03/17/2020   NODE DISSECTION Right 03/17/2020   Procedure: Node Dissection;  Surgeon: [name], MD;  Location: [place];  Service: Thoracic;  Laterality: Right;   TUBAL LIGATION     VIDEO BRONCHOSCOPY WITH ENDOBRONCHIAL ULTRASOUND N/A 05/22/2022   Procedure: VIDEO BRONCHOSCOPY WITH ENDOBRONCHIAL ULTRASOUND;  Surgeon: [name], MD;  Location: [place];  Service: Thoracic;  Laterality: N/A;   HPI:  Amy [name] is a 76 y.o. female who presented with difficulty swallowing. She reports that she's had at least a weeks worth of difficulty swallowing and nausea. She has tried anti-emetics and carafate, but has been unable to keep anything down. She feels that everything is getting stuck. She went to see her onco team today and was found to be tachycardic.  CXR 9/29: "Postsurgical changes RIGHT lung base. Lungs otherwise clear. No pulmonary infiltrate, pleural effusion, or  pneumothorax." Pt with medical history significant of NSCLC, COPD, HTN, HLD, anxiety.    Assessment / Plan / Recommendation  Clinical Impression  Pt presents with symptoms seemingly consistent with esophageal dysphagia 2/2 radiation tx for NSCLC.  Pt c/o odynophagia, feeling of stasis, coughing, and regurgitation.  She indicates feeling of stasis at sternal notch.   Pt tolerated thin liquid by spoon.  There was delayed throat clear with cup sip.  Pt was repoisitioned with more upright posture with no further coughing with thin liquid.  There was cough with regular solid cracker.  This is suspected to be a vagal cough reponse.  Pt reports difficutly getting solids down. Pt appears safe for consumption of any consistency diet, but she would like to start full liquid diet 2/2 discomfort with PO intake.  GI is following patient.  No endoscopy at this time, but they have some medication which may help to alleviate symptoms.  SLP to follow in house for diet tolerance and advancement, but most likely, pt will need time for symptoms to resolve.  Recommend full liquid diet advancing up to regular solid texture as pt can tolerate.    SLP Visit Diagnosis: Dysphagia, pharyngoesophageal phase (R13.14)    Aspiration Risk  Mild aspiration risk    Diet Recommendation Thin liquid   Liquid Administration via: Cup;Straw Medication Administration: Crushed with puree Supervision: Patient able to self feed Compensations: Slow rate;Small sips/bites;Follow solids with liquid Postural Changes: Seated upright at 90 degrees;Remain upright for at least 30 minutes after po  intake    Other  Recommendations Oral Care Recommendations: Oral care BID    Recommendations for follow up therapy are one component of a multi-disciplinary discharge planning process, led by the attending physician.  Recommendations may be updated based on patient status, additional functional criteria and insurance authorization.  Follow up  Recommendations No SLP follow up      Assistance Recommended at Discharge None  Functional Status Assessment Patient has had a recent decline in their functional status and demonstrates the ability to make significant improvements in function in a reasonable and predictable amount of time.  Frequency and Duration min 1 x/week  2 weeks       Prognosis Prognosis for Safe Diet Advancement: Good      Swallow Study   General Date of Onset: 07/14/22 HPI: Amy [name] is a 76 y.o. female who presented with difficulty swallowing. She reports that she's had at least a weeks worth of difficulty swallowing and nausea. She has tried anti-emetics and carafate, but has been unable to keep anything down. She feels that everything is getting stuck. She went to see her onco team today and was found to be tachycardic.  CXR 9/29: "Postsurgical changes RIGHT lung base. Lungs otherwise clear. No pulmonary infiltrate, pleural effusion, or pneumothorax." Pt with medical history significant of NSCLC, COPD, HTN, HLD, anxiety. Type of Study: Bedside Swallow Evaluation Previous Swallow Assessment: none Diet Prior to this Study: NPO Temperature Spikes Noted: No History of Recent Intubation: No Behavior/Cognition: Alert;Cooperative;Pleasant mood Oral Cavity Assessment: Within Functional Limits Oral Care Completed by SLP: No Oral Cavity - Dentition: Dentures, top;Dentures, bottom Self-Feeding Abilities: Able to feed self Patient Positioning: Upright in bed Baseline Vocal Quality: Normal Volitional Cough: Strong Volitional Swallow: Able to elicit    Oral/Motor/Sensory Function Overall Oral Motor/Sensory Function: Within functional limits Facial ROM: Within Functional Limits Facial Symmetry: Within Functional Limits Lingual ROM: Within Functional Limits Lingual Symmetry: Within Functional Limits Lingual Strength: Within Functional Limits Velum: Within Functional Limits Mandible: Within Functional Limits    Ice Chips Ice chips: Not tested   Thin Liquid Thin Liquid: Impaired Presentation: Cup;Spoon;Straw Pharyngeal  Phase Impairments: Throat Clearing - Delayed    Nectar Thick Nectar Thick Liquid: Not tested   Honey Thick Honey Thick Liquid: Not tested   Puree Puree: Within functional limits   Solid     Solid: Impaired Pharyngeal Phase Impairments: Cough - Immediate      Celedonio Savage, MA, [place] Office: [phone] 07/15/2022,2:59 PM

## 2022-07-15 NOTE — Progress Notes (Signed)
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  Patient with poor oral intake due to nausea and emesis.  Patient tried antiemetics and Carafate but unable to keep anything down.  Patient presented to the ED noted to be new onset A-fib and placed on a Cardizem drip.  Hematology/oncology consulted and following, GI consulted with consultation pending.   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 -Likely secondary to radiation treatments versus a GI etiology. -Patient unable to tolerate viscous lidocaine overnight and has been changed to Vicodin elixir per oncology. -Continue IV PPI every 12 hours. -SLP evaluation pending. -GI consultation pending. -IV fluids, supportive care.  2.  New onset atrial fibrillation -Questionable etiology. -2D echo done this morning with EF > 75%, NWMA. Nl LA, no valvular abnormalities.  -Keep potassium approximately 4, magnesium approximately 2. -Due to dysphagia/odynophagia patient currently on a Cardizem drip. -Continue full dose Lovenox for anticoagulation. -Once oral intake improves could transition to  oral DOAC. -We will need outpatient follow-up with cardiology.  3.  GERD -IV PPI.  4.  Non-small cell lung cancer -Seen by oncologist, Dr. Marin Olp. -Per oncology.  5.  Pancytopenia -Patient with a worsening neutropenia white count of 0.9 today, hemoglobin at 8.3, platelet count of 118. -Likely secondary to chemotherapy/radiation treatment. -Patient with no overt bleeding, afebrile. -Chest x-ray with no acute abnormalities. -Patient with no urinary symptoms. -Check a UA with cultures and sensitivities. -Patient started on Neupogen/Granix per hematology/oncology. -Transfusion threshold hemoglobin < 7, platelet count , 20000. -Monitor on anticoagulation. -Per hematology/oncology.  6.  COPD -Place on Dulera, Incruse -Xopenex/Atrovent nebs as needed.  7.  Protein calorie malnutrition -Likely secondary to poor oral intake due to dysphagia/odynophagia in the setting of non-small cell lung cancer. -Placed on IV albumin every 6 hours x24 hours. -Patient placed on nutritional supplementation. -Supportive care.  8.  Hyperlipidemia -Hold statin.    DVT prophylaxis: Lovenox Code Status: Full Family Communication: Updated patient and husband at bedside. Disposition: Likely home with home health when clinically improved.  Status is: Inpatient Remains inpatient appropriate because: Severity of illness   Consultants:  Oncology: Dr. Marin Olp 07/14/2022 Gastroenterology pending  Procedures:  Chest x-ray 07/14/2022. 2D echo 07/15/2022  Antimicrobials:  None   Subjective: Laying in bed.  Denies any chest pain.  No significant shortness of breath.  Still with dysphagia and odynophagia.  Stated could not tolerate viscous lidocaine last night felt he was too thick and felt she was going to suffocate.  Husband at bedside.  Patient on Cardizem drip.  Objective: Vitals:   07/15/22 0400 07/15/22 0451 07/15/22 0500 07/15/22 0904  BP: (!) 117/50  (!) 113/47   Pulse: (!) 106  Marland Kitchen)  106    Resp: (!) 24  (!) 25   Temp:  98.7 F (37.1 C)  99.4 F (37.4 C)  TempSrc:  Oral  Oral  SpO2: 94%  95%   Weight:      Height:        Intake/Output Summary (Last 24 hours) at 07/15/2022 1020 Last data filed at 07/15/2022 0651 Gross per 24 hour  Intake 2481.59 ml  Output 200 ml  Net 2281.59 ml   Filed Weights   07/14/22 1441 07/14/22 1908  Weight: 71.7 kg 73.3 kg    Examination:  General exam: Appears calm and comfortable  Respiratory system: Clear to auscultation.  No wheezes, no crackles, no rhonchi.  Fair air movement.  Respiratory effort normal. Cardiovascular system: Irregularly irregular. No JVD, murmurs, rubs, gallops or clicks. No pedal edema. Gastrointestinal system: Abdomen is nondistended, soft and nontender. No organomegaly or masses felt. Normal bowel sounds heard. Central nervous system: Alert and oriented. No focal neurological deficits. Extremities: Symmetric 5 x 5 power. Skin: No rashes, lesions or ulcers Psychiatry: Judgement and insight appear normal. Mood & affect appropriate.     Data Reviewed: I have personally reviewed following labs and imaging studies  CBC: Recent Labs  Lab 07/10/22 0904 07/14/22 1127 07/15/22 0315  WBC 1.9* 1.2* 0.9*  NEUTROABS 1.4* 1.0* 0.6*  HGB 11.6* 11.4* 8.3*  HCT 33.7* 33.2* 25.6*  MCV 86.9 86.9 91.4  PLT 169 163 118*    Basic Metabolic Panel: Recent Labs  Lab 07/10/22 0904 07/14/22 1127 07/15/22 0315 07/15/22 0812  NA 137 138 139  --   K 3.9 4.3 3.7  --   CL 103 103 110  --   CO2 25 27 24   --   GLUCOSE 125* 115* 86  --   BUN 21 23 22   --   CREATININE 1.19* 0.95 0.80  --   CALCIUM 9.5 9.4 8.3*  --   MG  --   --   --  1.7    GFR: Estimated Creatinine Clearance: 55.6 mL/min (by C-G formula based on SCr of 0.8 mg/dL).  Liver Function Tests: Recent Labs  Lab 07/10/22 0904 07/14/22 1127 07/15/22 0315  AST 16 16 16   ALT 13 12 13   ALKPHOS 65 64 44  BILITOT 1.1 2.1* 1.8*  PROT 6.9 6.9 5.0*   ALBUMIN 4.0 4.1 2.9*    CBG: Recent Labs  Lab 07/14/22 1936  GLUCAP 90     Recent Results (from the past 240 hour(s))  MRSA Next Gen by PCR, Nasal     Status: None   Collection Time: 07/14/22 10:38 PM   Specimen: Nasal Mucosa; Nasal Swab  Result Value Ref Range Status   MRSA by PCR Next Gen NOT DETECTED NOT DETECTED Final    Comment: (NOTE) The GeneXpert MRSA Assay (FDA approved for NASAL specimens only), is one component of a comprehensive MRSA colonization surveillance program. It is not intended to diagnose MRSA infection nor to guide or monitor treatment for MRSA infections. Test performance is not FDA approved in patients less than 78 years old. Performed at [facility], [place] [address], [place], [place] [address]          Radiology Studies: DG Chest Port 1 View  Result Date: 07/14/2022 CLINICAL DATA:  Nausea, vomiting, shortness of breath, weakness EXAM: PORTABLE CHEST 1 VIEW COMPARISON:  Portable exam 1418 hours compared to 05/22/2022 FINDINGS: Normal heart size, mediastinal contours, and pulmonary vascularity. Atherosclerotic calcification aorta. Postsurgical changes RIGHT  lung base. Lungs otherwise clear. No pulmonary infiltrate, pleural effusion, or pneumothorax. Bones demineralized. IMPRESSION: Postsurgical changes inferior RIGHT hemithorax, stable. No acute intrathoracic abnormalities. Aortic Atherosclerosis (ICD10-I70.0). Electronically Signed   By: Lavonia Dana M.D.   On: 07/14/2022 14:25        Scheduled Meds:  Chlorhexidine Gluconate Cloth  6 each Topical Daily   enoxaparin (LOVENOX) injection  70 mg Subcutaneous Q12H   feeding supplement  1 Container Oral TID BM   mouth rinse  15 mL Mouth Rinse 4 times per day   pantoprazole (PROTONIX) IV  40 mg Intravenous Q12H   Tbo-Filgrastim  480 mcg Subcutaneous q1800   Continuous Infusions:  sodium chloride 100 mL/hr at 07/15/22 0853   diltiazem (CARDIZEM) infusion 5 mg/hr (07/15/22 0651)    magnesium sulfate bolus IVPB       LOS: 1 day    Time spent: 40 minutes    Irine Seal, MD [facility]   To contact the attending provider between 7A-7P or the covering provider during after hours 7P-7A, please log into the web site [email or web address] and access using universal [place] password for that web site. If you do not have the password, please call the hospital operator.  07/15/2022, 10:20 AM

## 2022-07-15 NOTE — Progress Notes (Signed)
Amy [name] is now in the ICU.  She still has a atrial fibrillation.  She is on a diltiazem drip.  She still has the dysphagia and odynophagia.  She cannot tolerate the viscous lidocaine.  This may have been a bit too thick for her.  As such, I will try her on some Vicodin elixir.  This may be limited easier to swallow.  She clearly needs to have some nutrition.  She is only able to take in some liquids.  I have ordered some Breeze for her.  Her white cell count is actually lower.  Her white count is 900.  Hemoglobin 8.3.  Platelet count 118,000.  We will will have to watch the hemoglobin closely.  If it drops further, I probably would transfuse her.  Her sodium is 139.  Potassium 3.7.  BUN 22 creatinine 0.8.  Albumin is 2.9.  She does not complain of any fever.  There is no bleeding.  She has had no diarrhea.  Her vital signs show temperature of 98.7.  Pulse 106.  Blood pressure 113/47.  Her lungs sound clear bilaterally.  She has good air movement bilaterally.  Cardiac exam tachycardic and irregular.  This is consistent with the atrial fibrillation.  Abdomen is soft.  Bowel sounds are somewhat decreased.  Extremity shows no clubbing, cyanosis or edema.  I forgot to mention that she is on Lovenox.  I probably would keep her on Lovenox until she can swallow and then switch over to a DOAC.  We will continue her on the Neupogen for right now.  Hopefully, we will start to see her white cell count come up.  I know that she will get incredible care from everybody down in the ICU.  [name], MD  Oswaldo Milian 41:10

## 2022-07-16 DIAGNOSIS — T66XXXD Radiation sickness, unspecified, subsequent encounter: Secondary | ICD-10-CM | POA: Diagnosis not present

## 2022-07-16 DIAGNOSIS — C3431 Malignant neoplasm of lower lobe, right bronchus or lung: Secondary | ICD-10-CM | POA: Diagnosis not present

## 2022-07-16 DIAGNOSIS — D649 Anemia, unspecified: Secondary | ICD-10-CM

## 2022-07-16 DIAGNOSIS — F419 Anxiety disorder, unspecified: Secondary | ICD-10-CM

## 2022-07-16 DIAGNOSIS — R1319 Other dysphagia: Secondary | ICD-10-CM

## 2022-07-16 DIAGNOSIS — R112 Nausea with vomiting, unspecified: Secondary | ICD-10-CM

## 2022-07-16 DIAGNOSIS — C3490 Malignant neoplasm of unspecified part of unspecified bronchus or lung: Secondary | ICD-10-CM | POA: Diagnosis not present

## 2022-07-16 DIAGNOSIS — R1310 Dysphagia, unspecified: Secondary | ICD-10-CM | POA: Diagnosis not present

## 2022-07-16 DIAGNOSIS — I4891 Unspecified atrial fibrillation: Secondary | ICD-10-CM | POA: Diagnosis not present

## 2022-07-16 DIAGNOSIS — K209 Esophagitis, unspecified without bleeding: Secondary | ICD-10-CM | POA: Diagnosis not present

## 2022-07-16 LAB — COMPREHENSIVE METABOLIC PANEL
ALT: 13 U/L (ref 0–44)
AST: 13 U/L — ABNORMAL LOW (ref 15–41)
Albumin: 3.5 g/dL (ref 3.5–5.0)
Alkaline Phosphatase: 39 U/L (ref 38–126)
Anion gap: 6 (ref 5–15)
BUN: 14 mg/dL (ref 8–23)
CO2: 22 mmol/L (ref 22–32)
Calcium: 8.3 mg/dL — ABNORMAL LOW (ref 8.9–10.3)
Chloride: 111 mmol/L (ref 98–111)
Creatinine, Ser: 0.78 mg/dL (ref 0.44–1.00)
GFR, Estimated: >60 mL/min (ref >60)
Glucose, Bld: 80 mg/dL (ref 70–99)
Potassium: 3.3 mmol/L — ABNORMAL LOW (ref 3.5–5.1)
Sodium: 139 mmol/L (ref 135–145)
Total Bilirubin: 1.5 mg/dL — ABNORMAL HIGH (ref 0.3–1.2)
Total Protein: 5.3 g/dL — ABNORMAL LOW (ref 6.5–8.1)

## 2022-07-16 LAB — CBC WITH DIFFERENTIAL/PLATELET
Abs Immature Granulocytes: 0.01 10*3/uL (ref 0.00–0.07)
Basophils Absolute: 0 10*3/uL (ref 0.0–0.1)
Basophils Relative: 2 %
Eosinophils Absolute: 0 10*3/uL (ref 0.0–0.5)
Eosinophils Relative: 1 %
HCT: 23.3 % — ABNORMAL LOW (ref 36.0–46.0)
Hemoglobin: 7.5 g/dL — ABNORMAL LOW (ref 12.0–15.0)
Immature Granulocytes: 1 %
Lymphocytes Relative: 11 %
Lymphs Abs: 0.1 10*3/uL — ABNORMAL LOW (ref 0.7–4.0)
MCH: 29.6 pg (ref 26.0–34.0)
MCHC: 32.2 g/dL (ref 30.0–36.0)
MCV: 92.1 fL (ref 80.0–100.0)
Monocytes Absolute: 0.1 10*3/uL (ref 0.1–1.0)
Monocytes Relative: 7 %
Neutro Abs: 1 10*3/uL — ABNORMAL LOW (ref 1.7–7.7)
Neutrophils Relative %: 78 %
Platelets: 102 10*3/uL — ABNORMAL LOW (ref 150–400)
RBC: 2.53 MIL/uL — ABNORMAL LOW (ref 3.87–5.11)
RDW: 16.9 % — ABNORMAL HIGH (ref 11.5–15.5)
WBC: 1.2 10*3/uL — CL (ref 4.0–10.5)
nRBC: 0 % (ref 0.0–0.2)

## 2022-07-16 LAB — FERRITIN: Ferritin: 176 ng/mL (ref 11–307)

## 2022-07-16 LAB — CBC
HCT: 22.7 % — ABNORMAL LOW (ref 36.0–46.0)
Hemoglobin: 7.5 g/dL — ABNORMAL LOW (ref 12.0–15.0)
MCH: 30 pg (ref 26.0–34.0)
MCHC: 33 g/dL (ref 30.0–36.0)
MCV: 90.8 fL (ref 80.0–100.0)
Platelets: 116 10*3/uL — ABNORMAL LOW (ref 150–400)
RBC: 2.5 MIL/uL — ABNORMAL LOW (ref 3.87–5.11)
RDW: 16.8 % — ABNORMAL HIGH (ref 11.5–15.5)
WBC: 1.5 10*3/uL — ABNORMAL LOW (ref 4.0–10.5)
nRBC: 0 % (ref 0.0–0.2)

## 2022-07-16 LAB — MAGNESIUM: Magnesium: 1.8 mg/dL (ref 1.7–2.4)

## 2022-07-16 LAB — URINALYSIS, ROUTINE W REFLEX MICROSCOPIC
Bilirubin Urine: NEGATIVE
Glucose, UA: NEGATIVE mg/dL
Hgb urine dipstick: NEGATIVE
Ketones, ur: 80 mg/dL — AB
Leukocytes,Ua: NEGATIVE
Nitrite: NEGATIVE
Protein, ur: NEGATIVE mg/dL
Specific Gravity, Urine: 1.013 (ref 1.005–1.030)
pH: 5 (ref 5.0–8.0)

## 2022-07-16 LAB — RETICULOCYTES
Immature Retic Fract: 8.8 % (ref 2.3–15.9)
RBC.: 2.58 MIL/uL — ABNORMAL LOW (ref 3.87–5.11)
Retic Count, Absolute: 24.3 10*3/uL (ref 19.0–186.0)
Retic Ct Pct: 0.9 % (ref 0.4–3.1)

## 2022-07-16 LAB — IRON AND TIBC
Iron: 47 ug/dL (ref 28–170)
Saturation Ratios: 23 % (ref 10.4–31.8)
TIBC: 203 ug/dL — ABNORMAL LOW (ref 250–450)
UIBC: 156 ug/dL

## 2022-07-16 LAB — FOLATE: Folate: 20.3 ng/mL (ref >5.9)

## 2022-07-16 LAB — PREPARE RBC (CROSSMATCH)

## 2022-07-16 LAB — VITAMIN B12: Vitamin B-12: 305 pg/mL (ref 180–914)

## 2022-07-16 MED ORDER — UMECLIDINIUM BROMIDE 62.5 MCG/ACT IN AEPB
1.0000 | INHALATION_SPRAY | Freq: Every day | RESPIRATORY_TRACT | Status: DC
  Administered 2022-07-16 – 2022-07-19 (×4): 1 via RESPIRATORY_TRACT
  Filled 2022-07-16: qty 7

## 2022-07-16 MED ORDER — HEPARIN SOD (PORK) LOCK FLUSH 100 UNIT/ML IV SOLN: 250.0000 [IU] | INTRAVENOUS | Status: DC | PRN

## 2022-07-16 MED ORDER — SODIUM CHLORIDE 0.9% FLUSH: 10.0000 mL | INTRAVENOUS | Status: DC | PRN

## 2022-07-16 MED ORDER — POTASSIUM CHLORIDE 10 MEQ/100ML IV SOLN
10.0000 meq | INTRAVENOUS | Status: AC
  Administered 2022-07-16 (×4): 10 meq via INTRAVENOUS
  Filled 2022-07-16 (×4): qty 100

## 2022-07-16 MED ORDER — SODIUM CHLORIDE 0.9% FLUSH: 3.0000 mL | INTRAVENOUS | Status: DC | PRN

## 2022-07-16 MED ORDER — HEPARIN SOD (PORK) LOCK FLUSH 100 UNIT/ML IV SOLN: 500.0000 [IU] | Freq: Every day | INTRAVENOUS | Status: DC | PRN

## 2022-07-16 MED ORDER — FUROSEMIDE 10 MG/ML IJ SOLN
20.0000 mg | Freq: Once | INTRAMUSCULAR | Status: AC
  Filled 2022-07-16: qty 2

## 2022-07-16 MED ORDER — FUROSEMIDE 10 MG/ML IJ SOLN
20.0000 mg | Freq: Once | INTRAMUSCULAR | Status: AC
  Administered 2022-07-16: 20 mg via INTRAVENOUS

## 2022-07-16 MED ORDER — MAGNESIUM SULFATE 2 GM/50ML IV SOLN
2.0000 g | Freq: Once | INTRAVENOUS | Status: AC
  Administered 2022-07-16: 2 g via INTRAVENOUS
  Filled 2022-07-16: qty 50

## 2022-07-16 MED ORDER — DIPHENHYDRAMINE HCL 50 MG/ML IJ SOLN: 6.2500 mg | Freq: Once | INTRAMUSCULAR | Status: DC

## 2022-07-16 MED ORDER — SODIUM CHLORIDE 0.9% IV SOLUTION: 250.0000 mL | Freq: Once | INTRAVENOUS | Status: DC

## 2022-07-16 NOTE — Progress Notes (Signed)
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  Patient with poor oral intake due to nausea and emesis.  Patient tried antiemetics and Carafate but unable to keep anything down.  Patient presented to the ED noted to be new onset A-fib and placed on a Cardizem drip.  Hematology/oncology consulted and following, GI consulted with consultation pending.   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 -Likely secondary to radiation treatments versus a GI etiology. -Patient unable to tolerate viscous lidocaine and has been changed to Vicodin elixir per oncology. 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 -Hematology/oncology feel from their standpoint, patient should be okay for upper endoscopy to rule out stricture. -Continue IV PPI every 12 hours. -GI following and appreciate their input and recommendations. -Continue gentle hydration, full liquid diet. -Supportive care. -Hematology/oncology, GI following and appreciate  input and recommendations.   2.  New onset atrial fibrillation -Questionable etiology. -2D echo done with EF > 75%, NWMA. Nl LA, no valvular abnormalities.  -Keep potassium approximately 4, magnesium approximately 2. -Due to dysphagia/odynophagia patient currently on a Cardizem drip. -Once dysphagia/oral intake improves could transition to oral Cardizem. -Continue full dose Lovenox for anticoagulation. -Once oral intake improves could transition to oral DOAC. -Will need outpatient follow-up with cardiology.  3.  GERD -Continue IV PPI.   4.  Non-small cell lung cancer -Seen by oncologist, Dr. Marin Olp. -Per oncology.  5.  Pancytopenia -Patient with a slight improvement with neutropenia with white count at 1.2 today from 0.9, hemoglobin at 7.5, platelet count of 102. -Likely secondary to chemotherapy/radiation treatment. -Patient with no overt bleeding, afebrile. -Chest x-ray with no acute abnormalities. -Patient with no urinary symptoms. -Check a UA with cultures and sensitivities. -Patient started on Neupogen/Granix per hematology/oncology. -Transfusion threshold hemoglobin < 7, platelet count , 20000. -Repeat hemoglobin this afternoon and if worse will transfuse 1 unit packed red blood cells. -Monitor on anticoagulation. -Per hematology/oncology.  6.  COPD -Continue Incruse, Dulera.   -Xopenex/Atrovent nebs as needed.  7.  Protein calorie malnutrition -Likely secondary to poor oral intake due to dysphagia/odynophagia in the setting of non-small cell lung cancer. -Status post IV albumin every 6 hours x24 hours. -Patient placed on nutritional supplementation. -Supportive care.  8.  Hyperlipidemia -Continue to hold statin and resume on discharge.      DVT prophylaxis: Lovenox Code Status: Full Family Communication: Updated patient and husband and daughter at bedside. Disposition: Likely home with home health when clinically improved.  Status is: Inpatient Remains  inpatient appropriate because: Severity of illness   Consultants:  Oncology: Dr. Marin Olp 07/14/2022 Gastroenterology: Dr. Randel Pigg 07/15/2022  Procedures:  Chest x-ray 07/14/2022. 2D echo 07/15/2022  Antimicrobials:  None   Subjective: Sitting up in bed.  Husband and daughter at bedside.  States still difficult swallowing.  Difficulty swallowing Carafate yesterday.  Patient.  Denies any chest pain.  States some shortness of breath.  Overall no significant change from admission.  Currently on Cardizem drip with better rate control.   Objective: Vitals:   07/16/22 0800 07/16/22 0838 07/16/22 0900 07/16/22 0935  BP: (!) 126/48  (!) 134/50   Pulse: 65  97   Resp: (!) 25  (!) 26   Temp:  99 F (37.2 C)    TempSrc:  Oral    SpO2: 95%  96% 94%  Weight:      Height:        Intake/Output Summary (Last 24 hours) at 07/16/2022 1016 Last data filed at 07/16/2022 0616 Gross per 24 hour  Intake 2300.14 ml  Output 1100 ml  Net 1200.14 ml    Filed Weights   07/14/22 1441 07/14/22 1908  Weight: 71.7 kg 73.3 kg    Examination:  General exam: NAD. Respiratory system: Lungs clear to auscultation bilaterally.  No wheezes, no crackles, no rhonchi.  Fair air movement.  Respiratory effort normal. Cardiovascular system: Irregularly irregular.  No JVD, no murmurs rubs or gallops.  No lower extremity edema.   Gastrointestinal system: Abdomen is soft, nontender, nondistended, positive bowel sounds.  No rebound.  No guarding.   Central nervous system: Alert and oriented. No focal neurological deficits. Extremities: Symmetric 5 x 5 power. Skin: No rashes, lesions or ulcers Psychiatry: Judgement and insight appear normal. Mood & affect appropriate.     Data Reviewed: I have personally reviewed following labs and imaging studies  CBC: Recent Labs  Lab 07/10/22 0904 07/14/22 1127 07/15/22 0315 07/16/22 0341  WBC 1.9* 1.2* 0.9* 1.2*  NEUTROABS 1.4* 1.0* 0.6* 1.0*  HGB 11.6* 11.4* 8.3*  7.5*  HCT 33.7* 33.2* 25.6* 23.3*  MCV 86.9 86.9 91.4 92.1  PLT 169 163 118* 102*     Basic Metabolic Panel: Recent Labs  Lab 07/10/22 0904 07/14/22 1127 07/15/22 0315 07/15/22 0812 07/16/22 0341  NA 137 138 139  --  139  K 3.9 4.3 3.7  --  3.3*  CL 103 103 110  --  111  CO2 25 27 24   --  22  GLUCOSE 125* 115* 86  --  80  BUN 21 23 22   --  14  CREATININE 1.19* 0.95 0.80  --  0.78  CALCIUM 9.5 9.4 8.3*  --  8.3*  MG  --   --   --  1.7 1.8     GFR: Estimated Creatinine Clearance: 55.6 mL/min (by C-G formula based on SCr of 0.78 mg/dL).  Liver Function Tests: Recent Labs  Lab 07/10/22 0904 07/14/22 1127 07/15/22 0315 07/16/22 0341  AST 16 16 16  13*  ALT 13 12 13 13   ALKPHOS 65 64 44 39  BILITOT 1.1 2.1* 1.8* 1.5*  PROT 6.9 6.9 5.0* 5.3*  ALBUMIN 4.0 4.1 2.9* 3.5     CBG: Recent Labs  Lab 07/14/22 1936  GLUCAP 90      Recent Results (from the past 240 hour(s))  MRSA Next Gen by PCR, Nasal     Status: None   Collection Time: 07/14/22 10:38 PM   Specimen: Nasal Mucosa; Nasal Swab  Result Value Ref Range Status   MRSA by PCR Next Gen  NOT DETECTED NOT DETECTED Final    Comment: (NOTE) The GeneXpert MRSA Assay (FDA approved for NASAL specimens only), is one component of a comprehensive MRSA colonization surveillance program. It is not intended to diagnose MRSA infection nor to guide or monitor treatment for MRSA infections. Test performance is not FDA approved in patients less than 68 years old. Performed at [facility], [place] [address] [place], [place] [address]          Radiology Studies: ECHOCARDIOGRAM COMPLETE  Result Date: 07/15/2022    ECHOCARDIOGRAM REPORT   Patient Name:   TISH BEGIN Date of Exam: 07/15/2022 Medical Rec #:  [id number]        Height:       63.0 in Accession #:    [id number]       Weight:       161.6 lb Date of Birth:  January 13, 1946        BSA:          1.766 m Patient Age:    59 years         BP:            113/47 mmHg Patient Gender: F                HR:           96 bpm. Exam Location:  Inpatient Procedure: 2D Echo, Cardiac Doppler and Color Doppler Indications:    Atrial fibrillation  History:        Patient has no prior history of Echocardiogram examinations.                 COPD; Risk Factors:Hypertension and Dyslipidemia.  Sonographer:    Jefferey Pica Referring Phys: [id number] Jonnie Finner  Sonographer Comments: Image acquisition challenging due to respiratory motion. IMPRESSIONS  1. Left ventricular ejection fraction, by estimation, is >75%. The left ventricle has hyperdynamic function. The left ventricle has no regional wall motion abnormalities. Left ventricular diastolic function could not be evaluated.  2. Right ventricular systolic function was not well visualized. The right ventricular size is not well visualized. Tricuspid regurgitation signal is inadequate for assessing PA pressure.  3. The mitral valve is grossly normal. No evidence of mitral valve regurgitation.  4. The aortic valve is tricuspid. Aortic valve regurgitation is not visualized. Aortic valve sclerosis is present, with no evidence of aortic valve stenosis.  5. The inferior vena cava is normal in size with greater than 50% respiratory variability, suggesting right atrial pressure of 3 mmHg. Comparison(s): No prior Echocardiogram. FINDINGS  Left Ventricle: Left ventricular ejection fraction, by estimation, is >75%. The left ventricle has hyperdynamic function. The left ventricle has no regional wall motion abnormalities. The left ventricular internal cavity size was normal in size. There is no left ventricular hypertrophy. Left ventricular diastolic function could not be evaluated due to atrial fibrillation. Left ventricular diastolic function could not be evaluated. Right Ventricle: The right ventricular size is not well visualized. Right vetricular wall thickness was not well visualized. Right ventricular systolic function was not  well visualized. Tricuspid regurgitation signal is inadequate for assessing PA pressure. Left Atrium: Left atrial size was normal in size. Right Atrium: Right atrial size was normal in size. Pericardium: There is no evidence of pericardial effusion. Mitral Valve: The mitral valve is grossly normal. No evidence of mitral valve regurgitation. Tricuspid Valve: The tricuspid valve is grossly normal. Tricuspid valve regurgitation is trivial. Aortic Valve: The aortic valve is tricuspid. Aortic  valve regurgitation is not visualized. Aortic valve sclerosis is present, with no evidence of aortic valve stenosis. Aortic valve peak gradient measures 11.5 mmHg. Pulmonic Valve: The pulmonic valve was normal in structure. Pulmonic valve regurgitation is not visualized. Aorta: The aortic root and ascending aorta are structurally normal, with no evidence of dilitation. Venous: The inferior vena cava is normal in size with greater than 50% respiratory variability, suggesting right atrial pressure of 3 mmHg. IAS/Shunts: No atrial level shunt detected by color flow Doppler.  LEFT VENTRICLE PLAX 2D LVIDd:         4.10 cm LVIDs:         1.40 cm LV PW:         0.90 cm LV IVS:        1.00 cm LVOT diam:     2.00 cm LV SV:         76 LV SV Index:   43 LVOT Area:     3.14 cm  IVC IVC diam: 1.30 cm LEFT ATRIUM             Index        RIGHT ATRIUM           Index LA diam:        3.70 cm 2.09 cm/m   RA Area:     12.30 cm LA Vol (A2C):   23.6 ml 13.36 ml/m  RA Volume:   29.60 ml  16.76 ml/m LA Vol (A4C):   46.3 ml 26.22 ml/m LA Biplane Vol: 35.0 ml 19.82 ml/m  AORTIC VALVE                 PULMONIC VALVE AV Area (Vmax): 2.61 cm     PV Vmax:       1.18 m/s AV Vmax:        169.75 cm/s  PV Peak grad:  5.6 mmHg AV Peak Grad:   11.5 mmHg LVOT Vmax:      141.00 cm/s LVOT Vmean:     89.650 cm/s LVOT VTI:       0.242 m  AORTA Ao Root diam: 3.10 cm Ao Asc diam:  2.90 cm  SHUNTS Systemic VTI:  0.24 m Systemic Diam: 2.00 cm Lyman Bishop MD  Electronically signed by Lyman Bishop MD Signature Date/Time: 07/15/2022/12:18:29 PM    Final    DG Chest Port 1 View  Result Date: 07/14/2022 CLINICAL DATA:  Nausea, vomiting, shortness of breath, weakness EXAM: PORTABLE CHEST 1 VIEW COMPARISON:  Portable exam 1418 hours compared to 05/22/2022 FINDINGS: Normal heart size, mediastinal contours, and pulmonary vascularity. Atherosclerotic calcification aorta. Postsurgical changes RIGHT lung base. Lungs otherwise clear. No pulmonary infiltrate, pleural effusion, or pneumothorax. Bones demineralized. IMPRESSION: Postsurgical changes inferior RIGHT hemithorax, stable. No acute intrathoracic abnormalities. Aortic Atherosclerosis (ICD10-I70.0). Electronically Signed   By: Lavonia Dana M.D.   On: 07/14/2022 14:25        Scheduled Meds:  Chlorhexidine Gluconate Cloth  6 each Topical Daily   enoxaparin (LOVENOX) injection  70 mg Subcutaneous Q12H   feeding supplement  1 Container Oral TID BM   mometasone-formoterol  2 puff Inhalation BID   mouth rinse  15 mL Mouth Rinse 4 times per day   pantoprazole (PROTONIX) IV  40 mg Intravenous Q12H   Tbo-Filgrastim  480 mcg Subcutaneous q1800   Continuous Infusions:  sodium chloride 100 mL/hr at 07/16/22 0545   albumin human 25 g (07/16/22 0616)   diltiazem (CARDIZEM) infusion 7.5 mg/hr (07/16/22 0545)  potassium chloride 10 mEq (07/16/22 1007)     LOS: 2 days    Time spent: 40 minutes    Irine Seal, MD [facility]   To contact the attending provider between 7A-7P or the covering provider during after hours 7P-7A, please log into the web site [email or web address] and access using universal [place] password for that web site. If you do not have the password, please call the hospital operator.  07/16/2022, 10:16 AM

## 2022-07-16 NOTE — Progress Notes (Signed)
Unfortunately, she still is having problems swallowing.  She cannot tolerate the Carafate.  She said that she threw that up.  I really think that she is going to need Gastroenterology to see her and to see about doing an upper endoscopy and to see about radiation esophagitis and stricture.  Is possible she is may have a significant stricture that could be affecting her swallowing.  Her white cell count is little bit better.  Her white count is 1.2.  Hemoglobin 7.5.  Platelet count 102,000.  Her ANC is 1000.  As such, I think it safe to have an upper endoscopy if needed.  I do think she is going need to be transfused.  Her hemoglobin is dropping.  There is no obvious bleeding.  I do think that a transfusion would make her feel better.  I talked her about a transfusion.  I told her that there is really no risk for Hepatitis or AIDS.  I explained how the transfusion is done.  She agrees to this.  She continues on the Lovenox for the atrial fibrillation.  She is still in atrial fibrillation.  Her rate is not bad.  She is on diltiazem.  Her electrolytes do show a low potassium 3.3.  Her BUN is 14 creatinine 0.78.  Calcium 8.3.  Her vital signs show temperature of 98.3.  Pulse 94.  Blood pressure 137/55.  Her head neck exam shows no ocular or oral lesions.  Her lungs are clear.  Cardiac exam is irregular but regular rate.  This is consistent with the atrial fibrillation.  Abdomen is soft.  Bowel sounds are decreased but present.  Extremity shows no clubbing, cyanosis or edema.  Neurological exam is nonfocal.  Amy [name] is a very charming 76 year old white female with locally recurrent non-small cell lung cancer.  She has been on carboplatinum/Taxol with radiation.  I would think that she should be done with treatment.  She has 1 more cycle of chemo and for more radiation treatments left.  However, I would think that given the significant side effects that she is having, I really would not think that  the benefit of 1 more cycle of chemo outweighs further toxicity.  Her real problem is this dysphagia.  Again, I would think that Gastroenterology would be able to do an upper endoscopy on her to see if there is a stricture that needs to be stretched.  I do think that her white cell count is adequate.  She has atrial fibrillation.  I would think cardiology would have to be involved at some point to try to help control the atrial fibrillation and to help with anticoagulation.  Dr. Julien Nordmann tomorrow to help further manage her hematologic and oncologic issues.  [name], MD  [place] 14:14

## 2022-07-16 NOTE — Progress Notes (Signed)
This RN noted order for type & screen was placed by Dr. Marin Olp at about 0800, but no orders to transfuse appeared in orders to be acknowledged. Opportunity to check results of type and screen did not arise until after 1600. Lab result was not showing up as collected. Further searching found the order to type and screen, as well as the order for informed consent for blood administration, to be listed on the patient care report page under post-discharge orders. Orders to transfuse 2 units of packed red blood cells and 2 orders for 20mg  of Lasix were found in signed and held orders. Lab notified and orders to type and screen/crossmatch were corrected so that lab was able to see the orders and come and collect, per Dr. Grandville Silos. Attempted to reach Dr. Marin Olp to clarify orders, however reply to page was never received.  Unable to modify order to obtain informed consent to appear as a task to occur during hospitalization versus post-discharge. Blood consent was signed by patient and placed in chart. Per Dr. Antonieta Pert note, patient was informed of risks and benefits of blood transfusion.   Charge RN and attending physician made aware, oncoming RN notified of transfusion delay. Lasix doses re-timed to be given on night-shift following blood transfusions, as specified in administration instructions.   Additionally, orders to heparin lock catheter (500u daily PRN and 250u as needed) were placed by Dr. Marin Olp. Patient only has peripheral access.   Patient's hemoglobin was 7.5 at 0341 and 1452, no signs of bleeding noted and patient remained hemodynamically stable.

## 2022-07-16 NOTE — Plan of Care (Signed)
  Problem: Elimination: Goal: Will not experience complications related to urinary retention Outcome: Progressing   Problem: Pain Managment: Goal: General experience of comfort will improve Outcome: Progressing   Problem: Safety: Goal: Ability to remain free from injury will improve Outcome: Progressing   Problem: Nutrition: Goal: Adequate nutrition will be maintained Outcome: Not Progressing

## 2022-07-16 NOTE — Progress Notes (Signed)
[facility] Progress Note  SUBJECTIVE:   Interval history: Kasyn Stouffer Fresquez was seen and evaluated today at bedside. She notes that she was unable to tolerate liquid carafate. During the course of today, she was able to swallow some pudding. She appears to be tolerating her secretions. She denied nausea, vomiting, abdominal pain, chest pain and shortness of breath. Oncology note reviewed, acknowledge concern for EGD procedure to evaluate esophageal mucosa further. Concern for instrumentation at this time and appears to be tolerating some oral intake.  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  Status post right lower lobe superior segmentectomy   Past Surgical History:  Procedure Laterality Date   APPENDECTOMY     EYE SURGERY     INTERCOSTAL NERVE BLOCK Right 03/17/2020   Procedure: Intercostal Nerve Block;  Surgeon: [name], MD;  Location: [place];  Service: Thoracic;  Laterality: Right;   LUNG SURGERY Right 03/17/2020   NODE DISSECTION Right 03/17/2020   Procedure: Node Dissection;  Surgeon: [name], MD;  Location: [place];  Service: Thoracic;  Laterality: Right;   TUBAL LIGATION     VIDEO BRONCHOSCOPY WITH ENDOBRONCHIAL ULTRASOUND N/A 05/22/2022   Procedure: VIDEO BRONCHOSCOPY WITH ENDOBRONCHIAL ULTRASOUND;  Surgeon: [name], MD;  Location: [place];  Service: Thoracic;  Laterality: N/A;   Current Facility-Administered Medications  Medication Dose Route Frequency Provider Last Rate Last Admin   0.9 %  sodium chloride infusion (Manually program via Guardrails IV Fluids)  250 mL Intravenous Once Volanda Napoleon, MD       0.9 %   sodium chloride infusion   Intravenous Continuous Eugenie Filler, MD   Stopped at 07/16/22 1358   Chlorhexidine Gluconate Cloth 2 % PADS 6 each  6 each Topical Daily Eugenie Filler, MD   6 each at 07/15/22 1604   diltiazem (CARDIZEM) 125 mg in dextrose 5% 125 mL (1 mg/mL) infusion  5-15 mg/hr Intravenous Titrated Kyle, Tyrone A, DO 7.5 mL/hr at 07/16/22 1419 7.5 mg/hr at 07/16/22 1419   enoxaparin (LOVENOX) injection 70 mg  70 mg Subcutaneous Q12H Adrian Saran, RPH   70 mg at 07/16/22 5053   feeding supplement (BOOST / RESOURCE BREEZE) liquid 1 Container  1 Container Oral TID BM Volanda Napoleon, MD   1 Container at 07/15/22 0950   furosemide (LASIX) injection 20 mg  20 mg Intravenous Once Volanda Napoleon, MD       furosemide (LASIX) injection 20 mg  20 mg Intravenous Once Volanda Napoleon, MD       heparin lock flush 100 unit/mL  500 Units Intracatheter Daily PRN Volanda Napoleon, MD       heparin lock flush 100 unit/mL  250 Units Intracatheter PRN Volanda Napoleon, MD       HYDROcodone-acetaminophen (HYCET) 7.5-325 mg/15 ml solution 10 mL  10 mL Oral Q4H PRN Volanda Napoleon, MD       mometasone-formoterol (DULERA) 200-5 MCG/ACT inhaler 2 puff  2 puff Inhalation BID Eugenie Filler, MD   2 puff at 07/16/22 [phone]  Oral care mouth rinse  15 mL Mouth Rinse 4 times per day Eugenie Filler, MD   15 mL at 07/16/22 0901   Oral care mouth rinse  15 mL Mouth Rinse PRN Eugenie Filler, MD       pantoprazole (PROTONIX) injection 40 mg  40 mg Intravenous Q12H Kyle, Tyrone A, DO   40 mg at 07/16/22 0836   prochlorperazine (COMPAZINE) injection 10 mg  10 mg Intravenous Q6H PRN Marylyn Ishihara, Tyrone A, DO   10 mg at 07/16/22 1140   sodium chloride flush (NS) 0.9 % injection 10 mL  10 mL Intracatheter PRN Volanda Napoleon, MD       sodium chloride flush (NS) 0.9 % injection 3 mL  3 mL Intracatheter PRN Volanda Napoleon, MD       Tbo-Filgrastim [facility]) injection 480 mcg  480 mcg Subcutaneous q1800  Volanda Napoleon, MD   480 mcg at 07/15/22 1744   umeclidinium bromide (INCRUSE ELLIPTA) 62.5 MCG/ACT 1 puff  1 puff Inhalation Daily Eugenie Filler, MD   1 puff at 07/16/22 1249   Allergies as of 07/14/2022   (No Known Allergies)   Review of Systems:  Review of Systems  Respiratory:  Negative for shortness of breath.   Cardiovascular:  Negative for chest pain.  Gastrointestinal:  Negative for abdominal pain, nausea and vomiting.    OBJECTIVE:   Temp:  [97.8 F (36.6 C)-99 F (37.2 C)] 98.4 F (36.9 C) (10/01 1700) Pulse Rate:  [65-107] 99 (10/01 1400) Resp:  [16-28] 28 (10/01 1400) BP: (118-155)/(44-69) 123/47 (10/01 1400) SpO2:  [94 %-97 %] 95 % (10/01 1400) Last BM Date :  (PTA) Physical Exam Constitutional:      General: She is not in acute distress.    Appearance: She is not ill-appearing, toxic-appearing or diaphoretic.  HENT:     Head:     Comments: Tolerating secretions. Cardiovascular:     Rate and Rhythm: Rhythm irregular.  Pulmonary:     Effort: No respiratory distress.     Breath sounds: Normal breath sounds.  Abdominal:     General: Bowel sounds are normal. There is no distension.     Palpations: Abdomen is soft.     Tenderness: There is no abdominal tenderness. There is no guarding.  Neurological:     Mental Status: She is alert.     Labs: Recent Labs    07/15/22 0315 07/16/22 0341 07/16/22 1452  WBC 0.9* 1.2* 1.5*  HGB 8.3* 7.5* 7.5*  HCT 25.6* 23.3* 22.7*  PLT 118* 102* 116*   BMET Recent Labs    07/14/22 1127 07/15/22 0315 07/16/22 0341  NA 138 139 139  K 4.3 3.7 3.3*  CL 103 110 111  CO2 27 24 22   GLUCOSE 115* 86 80  BUN 23 22 14   CREATININE 0.95 0.80 0.78  CALCIUM 9.4 8.3* 8.3*   LFT Recent Labs    07/16/22 0341  PROT 5.3*  ALBUMIN 3.5  AST 13*  ALT 13  ALKPHOS 39  BILITOT 1.5*   PT/INR No results for input(s): "LABPROT", "INR" in the last 72 hours. Diagnostic imaging: ECHOCARDIOGRAM COMPLETE  Result Date:  07/15/2022    ECHOCARDIOGRAM REPORT   Patient Name:   KORYNN KENEDY Date of Exam: 07/15/2022 Medical Rec #:  [id number]        Height:       63.0 in Accession #:    [id number]       Weight:  161.6 lb Date of Birth:  December 29, 1945        BSA:          1.766 m Patient Age:    84 years         BP:           113/47 mmHg Patient Gender: F                HR:           96 bpm. Exam Location:  Inpatient Procedure: 2D Echo, Cardiac Doppler and Color Doppler Indications:    Atrial fibrillation  History:        Patient has no prior history of Echocardiogram examinations.                 COPD; Risk Factors:Hypertension and Dyslipidemia.  Sonographer:    Jefferey Pica Referring Phys: [id number] Jonnie Finner  Sonographer Comments: Image acquisition challenging due to respiratory motion. IMPRESSIONS  1. Left ventricular ejection fraction, by estimation, is >75%. The left ventricle has hyperdynamic function. The left ventricle has no regional wall motion abnormalities. Left ventricular diastolic function could not be evaluated.  2. Right ventricular systolic function was not well visualized. The right ventricular size is not well visualized. Tricuspid regurgitation signal is inadequate for assessing PA pressure.  3. The mitral valve is grossly normal. No evidence of mitral valve regurgitation.  4. The aortic valve is tricuspid. Aortic valve regurgitation is not visualized. Aortic valve sclerosis is present, with no evidence of aortic valve stenosis.  5. The inferior vena cava is normal in size with greater than 50% respiratory variability, suggesting right atrial pressure of 3 mmHg. Comparison(s): No prior Echocardiogram. FINDINGS  Left Ventricle: Left ventricular ejection fraction, by estimation, is >75%. The left ventricle has hyperdynamic function. The left ventricle has no regional wall motion abnormalities. The left ventricular internal cavity size was normal in size. There is no left ventricular hypertrophy. Left  ventricular diastolic function could not be evaluated due to atrial fibrillation. Left ventricular diastolic function could not be evaluated. Right Ventricle: The right ventricular size is not well visualized. Right vetricular wall thickness was not well visualized. Right ventricular systolic function was not well visualized. Tricuspid regurgitation signal is inadequate for assessing PA pressure. Left Atrium: Left atrial size was normal in size. Right Atrium: Right atrial size was normal in size. Pericardium: There is no evidence of pericardial effusion. Mitral Valve: The mitral valve is grossly normal. No evidence of mitral valve regurgitation. Tricuspid Valve: The tricuspid valve is grossly normal. Tricuspid valve regurgitation is trivial. Aortic Valve: The aortic valve is tricuspid. Aortic valve regurgitation is not visualized. Aortic valve sclerosis is present, with no evidence of aortic valve stenosis. Aortic valve peak gradient measures 11.5 mmHg. Pulmonic Valve: The pulmonic valve was normal in structure. Pulmonic valve regurgitation is not visualized. Aorta: The aortic root and ascending aorta are structurally normal, with no evidence of dilitation. Venous: The inferior vena cava is normal in size with greater than 50% respiratory variability, suggesting right atrial pressure of 3 mmHg. IAS/Shunts: No atrial level shunt detected by color flow Doppler.  LEFT VENTRICLE PLAX 2D LVIDd:         4.10 cm LVIDs:         1.40 cm LV PW:         0.90 cm LV IVS:        1.00 cm LVOT diam:     2.00 cm LV SV:  76 LV SV Index:   43 LVOT Area:     3.14 cm  IVC IVC diam: 1.30 cm LEFT ATRIUM             Index        RIGHT ATRIUM           Index LA diam:        3.70 cm 2.09 cm/m   RA Area:     12.30 cm LA Vol (A2C):   23.6 ml 13.36 ml/m  RA Volume:   29.60 ml  16.76 ml/m LA Vol (A4C):   46.3 ml 26.22 ml/m LA Biplane Vol: 35.0 ml 19.82 ml/m  AORTIC VALVE                 PULMONIC VALVE AV Area (Vmax): 2.61 cm      PV Vmax:       1.18 m/s AV Vmax:        169.75 cm/s  PV Peak grad:  5.6 mmHg AV Peak Grad:   11.5 mmHg LVOT Vmax:      141.00 cm/s LVOT Vmean:     89.650 cm/s LVOT VTI:       0.242 m  AORTA Ao Root diam: 3.10 cm Ao Asc diam:  2.90 cm  SHUNTS Systemic VTI:  0.24 m Systemic Diam: 2.00 cm Lyman Bishop MD Electronically signed by Lyman Bishop MD Signature Date/Time: 07/15/2022/12:18:29 PM    Final     IMPRESSION: Dysphagia, acute on chronic  Odynophagia             - Suspect related to recent radiation therapy Locally recurrent non-small cell lung cancer COPD New onset atrial fibrillation  At risk for protein calorie malnutrition  PLAN: - Continue to attempt Carafate suspension, she was able to tolerate some oral intake today  - Will update CT chest order - Continue clear liquid diet  - Continue IV PPI Q12Hr - GI team will follow, depending on clinical course will discuss need for endoscopic evaluation   LOS: 2 days   Danton Clap, [facility]

## 2022-07-17 ENCOUNTER — Inpatient Hospital Stay: Payer: Medicare HMO

## 2022-07-17 ENCOUNTER — Inpatient Hospital Stay (HOSPITAL_COMMUNITY): Payer: Medicare HMO

## 2022-07-17 ENCOUNTER — Other Ambulatory Visit: Payer: Self-pay

## 2022-07-17 ENCOUNTER — Ambulatory Visit: Payer: Medicare HMO

## 2022-07-17 ENCOUNTER — Ambulatory Visit: Payer: Medicare HMO | Admitting: Physician Assistant

## 2022-07-17 ENCOUNTER — Inpatient Hospital Stay: Payer: Medicare HMO | Admitting: Physician Assistant

## 2022-07-17 DIAGNOSIS — R1310 Dysphagia, unspecified: Secondary | ICD-10-CM | POA: Diagnosis not present

## 2022-07-17 DIAGNOSIS — C3431 Malignant neoplasm of lower lobe, right bronchus or lung: Secondary | ICD-10-CM | POA: Diagnosis not present

## 2022-07-17 DIAGNOSIS — F419 Anxiety disorder, unspecified: Secondary | ICD-10-CM | POA: Diagnosis not present

## 2022-07-17 DIAGNOSIS — I4891 Unspecified atrial fibrillation: Secondary | ICD-10-CM | POA: Diagnosis not present

## 2022-07-17 DIAGNOSIS — E876 Hypokalemia: Secondary | ICD-10-CM

## 2022-07-17 LAB — CBC WITH DIFFERENTIAL/PLATELET
Abs Immature Granulocytes: 0.1 10*3/uL — ABNORMAL HIGH (ref 0.00–0.07)
Basophils Absolute: 0.1 10*3/uL (ref 0.0–0.1)
Basophils Relative: 2 %
Eosinophils Absolute: 0 10*3/uL (ref 0.0–0.5)
Eosinophils Relative: 1 %
HCT: 36.4 % (ref 36.0–46.0)
Hemoglobin: 12.3 g/dL (ref 12.0–15.0)
Immature Granulocytes: 3 %
Lymphocytes Relative: 6 %
Lymphs Abs: 0.2 10*3/uL — ABNORMAL LOW (ref 0.7–4.0)
MCH: 29.6 pg (ref 26.0–34.0)
MCHC: 33.8 g/dL (ref 30.0–36.0)
MCV: 87.7 fL (ref 80.0–100.0)
Monocytes Absolute: 0.4 10*3/uL (ref 0.1–1.0)
Monocytes Relative: 10 %
Neutro Abs: 3 10*3/uL (ref 1.7–7.7)
Neutrophils Relative %: 78 %
Platelets: 127 10*3/uL — ABNORMAL LOW (ref 150–400)
RBC: 4.15 MIL/uL (ref 3.87–5.11)
RDW: 18.4 % — ABNORMAL HIGH (ref 11.5–15.5)
WBC: 3.8 10*3/uL — ABNORMAL LOW (ref 4.0–10.5)
nRBC: 1.8 % — ABNORMAL HIGH (ref 0.0–0.2)

## 2022-07-17 LAB — COMPREHENSIVE METABOLIC PANEL
ALT: 13 U/L (ref 0–44)
AST: 15 U/L (ref 15–41)
Albumin: 4.5 g/dL (ref 3.5–5.0)
Alkaline Phosphatase: 50 U/L (ref 38–126)
Anion gap: 12 (ref 5–15)
BUN: 10 mg/dL (ref 8–23)
CO2: 26 mmol/L (ref 22–32)
Calcium: 9.3 mg/dL (ref 8.9–10.3)
Chloride: 100 mmol/L (ref 98–111)
Creatinine, Ser: 0.85 mg/dL (ref 0.44–1.00)
GFR, Estimated: >60 mL/min (ref >60)
Glucose, Bld: 94 mg/dL (ref 70–99)
Potassium: 3.1 mmol/L — ABNORMAL LOW (ref 3.5–5.1)
Sodium: 138 mmol/L (ref 135–145)
Total Bilirubin: 3.2 mg/dL — ABNORMAL HIGH (ref 0.3–1.2)
Total Protein: 6.7 g/dL (ref 6.5–8.1)

## 2022-07-17 LAB — MAGNESIUM: Magnesium: 1.5 mg/dL — ABNORMAL LOW (ref 1.7–2.4)

## 2022-07-17 MED ORDER — IOHEXOL 300 MG/ML  SOLN
100.0000 mL | Freq: Once | INTRAMUSCULAR | Status: AC | PRN
  Administered 2022-07-17: 100 mL via INTRAVENOUS

## 2022-07-17 MED ORDER — POTASSIUM CHLORIDE 10 MEQ/100ML IV SOLN
INTRAVENOUS | Status: AC
  Filled 2022-07-17: qty 100

## 2022-07-17 MED ORDER — FUROSEMIDE 10 MG/ML IJ SOLN
INTRAMUSCULAR | Status: AC
  Administered 2022-07-17: 20 mg via INTRAVENOUS
  Filled 2022-07-17: qty 2

## 2022-07-17 MED ORDER — SODIUM CHLORIDE (PF) 0.9 % IJ SOLN
INTRAMUSCULAR | Status: AC
  Filled 2022-07-17: qty 50

## 2022-07-17 MED ORDER — MAGNESIUM SULFATE 4 GM/100ML IV SOLN
4.0000 g | Freq: Once | INTRAVENOUS | Status: AC
  Administered 2022-07-17: 4 g via INTRAVENOUS
  Filled 2022-07-17: qty 100

## 2022-07-17 MED ORDER — POTASSIUM CHLORIDE 10 MEQ/100ML IV SOLN
10.0000 meq | INTRAVENOUS | Status: AC
  Administered 2022-07-17 (×4): 10 meq via INTRAVENOUS
  Filled 2022-07-17 (×4): qty 100

## 2022-07-17 MED ORDER — ENOXAPARIN SODIUM 80 MG/0.8ML IJ SOSY: 70.0000 mg | PREFILLED_SYRINGE | Freq: Two times a day (BID) | INTRAMUSCULAR | Status: DC

## 2022-07-17 MED ORDER — SODIUM CHLORIDE 0.9 % IV SOLN: INTRAVENOUS | Status: DC

## 2022-07-17 NOTE — Progress Notes (Signed)
DIAGNOSIS: Recurrent lung cancer initially diagnosed as stage IA (T1, N0, M0) non-small cell lung cancer, squamous cell carcinoma.  She presented with a right lower lobe lung nodule.  She was diagnosed in June 2021.    PRIOR THERAPY: Right lower lobe superior segmentectomy and lymph node dissection under the care of Dr. Roxan Hockey on 03/17/2020.   CURRENT THERAPY: Concurrent chemoradiation with weekly carboplatin for AUC of 2 and paclitaxel 45 Mg/M2.  First dose 06/05/2022.  Status post 6 cycles.  Subjective: The patient is seen and examined today.  Her daughter was at the bedside.  She is feeling a little bit better today but continues to have odynophagia.  She has been on a course of concurrent chemoradiation with weekly carboplatin and paclitaxel last dose of chemotherapy was given last week but the patient started having more radiation-induced esophagitis close to the end of her treatment course.  She has been on several medications for management of her dysphagia and odynophagia including Carafate but she does not take it at regular basis.  She also on Hycet for pain management as well as Protonix.  Her swallowing is a little bit better but not back to the normal.  She was seen by gastroenterology and there is expectation for upper endoscopy soon.  She denied having any other concerning complaints except for mild cough.  She has no nausea, vomiting, diarrhea or constipation.  She has no fever or chills.  Objective: Vital signs in last 24 hours: Temp:  [97.8 F (36.6 C)-99 F (37.2 C)] 98.8 F (37.1 C) (10/02 0615) Pulse Rate:  [55-100] 93 (10/02 0615) Resp:  [16-30] 25 (10/02 0615) BP: (118-155)/(38-68) 145/54 (10/02 0615) SpO2:  [91 %-97 %] 95 % (10/02 0615)  Intake/Output from previous day: 10/01 0701 - 10/02 0700 In: 2627.1 [I.V.:1294.6; Blood:836; IV Piggyback:496.4] Out: 3000 [Urine:3000] Intake/Output this shift: No intake/output data recorded.  General appearance: alert,  cooperative, fatigued, and no distress Resp: clear to auscultation bilaterally Cardio: regular rate and rhythm, S1, S2 normal, no murmur, click, rub or gallop GI: soft, non-tender; bowel sounds normal; no masses,  no organomegaly Extremities: extremities normal, atraumatic, no cyanosis or edema  Lab Results:  Recent Labs    07/16/22 0341 07/16/22 1452  WBC 1.2* 1.5*  HGB 7.5* 7.5*  HCT 23.3* 22.7*  PLT 102* 116*   BMET Recent Labs    07/15/22 0315 07/16/22 0341  NA 139 139  K 3.7 3.3*  CL 110 111  CO2 24 22  GLUCOSE 86 80  BUN 22 14  CREATININE 0.80 0.78  CALCIUM 8.3* 8.3*    Studies/Results: ECHOCARDIOGRAM COMPLETE  Result Date: 07/15/2022    ECHOCARDIOGRAM REPORT   Patient Name:   Amy [name] Date of Exam: 07/15/2022 Medical Rec #:  [id number]        Height:       63.0 in Accession #:    [id number]       Weight:       161.6 lb Date of Birth:  Mar 18, 1946        BSA:          1.766 m Patient Age:    76 years         BP:           113/47 mmHg Patient Gender: F                HR:           96 bpm. Exam Location:  Inpatient  Procedure: 2D Echo, Cardiac Doppler and Color Doppler Indications:    Atrial fibrillation  History:        Patient has no prior history of Echocardiogram examinations.                 COPD; Risk Factors:Hypertension and Dyslipidemia.  Sonographer:    Jefferey Pica Referring Phys: [id number] Jonnie Finner  Sonographer Comments: Image acquisition challenging due to respiratory motion. IMPRESSIONS  1. Left ventricular ejection fraction, by estimation, is >75%. The left ventricle has hyperdynamic function. The left ventricle has no regional wall motion abnormalities. Left ventricular diastolic function could not be evaluated.  2. Right ventricular systolic function was not well visualized. The right ventricular size is not well visualized. Tricuspid regurgitation signal is inadequate for assessing PA pressure.  3. The mitral valve is grossly normal. No evidence of  mitral valve regurgitation.  4. The aortic valve is tricuspid. Aortic valve regurgitation is not visualized. Aortic valve sclerosis is present, with no evidence of aortic valve stenosis.  5. The inferior vena cava is normal in size with greater than 50% respiratory variability, suggesting right atrial pressure of 3 mmHg. Comparison(s): No prior Echocardiogram. FINDINGS  Left Ventricle: Left ventricular ejection fraction, by estimation, is >75%. The left ventricle has hyperdynamic function. The left ventricle has no regional wall motion abnormalities. The left ventricular internal cavity size was normal in size. There is no left ventricular hypertrophy. Left ventricular diastolic function could not be evaluated due to atrial fibrillation. Left ventricular diastolic function could not be evaluated. Right Ventricle: The right ventricular size is not well visualized. Right vetricular wall thickness was not well visualized. Right ventricular systolic function was not well visualized. Tricuspid regurgitation signal is inadequate for assessing PA pressure. Left Atrium: Left atrial size was normal in size. Right Atrium: Right atrial size was normal in size. Pericardium: There is no evidence of pericardial effusion. Mitral Valve: The mitral valve is grossly normal. No evidence of mitral valve regurgitation. Tricuspid Valve: The tricuspid valve is grossly normal. Tricuspid valve regurgitation is trivial. Aortic Valve: The aortic valve is tricuspid. Aortic valve regurgitation is not visualized. Aortic valve sclerosis is present, with no evidence of aortic valve stenosis. Aortic valve peak gradient measures 11.5 mmHg. Pulmonic Valve: The pulmonic valve was normal in structure. Pulmonic valve regurgitation is not visualized. Aorta: The aortic root and ascending aorta are structurally normal, with no evidence of dilitation. Venous: The inferior vena cava is normal in size with greater than 50% respiratory variability, suggesting  right atrial pressure of 3 mmHg. IAS/Shunts: No atrial level shunt detected by color flow Doppler.  LEFT VENTRICLE PLAX 2D LVIDd:         4.10 cm LVIDs:         1.40 cm LV PW:         0.90 cm LV IVS:        1.00 cm LVOT diam:     2.00 cm LV SV:         76 LV SV Index:   43 LVOT Area:     3.14 cm  IVC IVC diam: 1.30 cm LEFT ATRIUM             Index        RIGHT ATRIUM           Index LA diam:        3.70 cm 2.09 cm/m   RA Area:     12.30 cm LA Vol (A2C):  23.6 ml 13.36 ml/m  RA Volume:   29.60 ml  16.76 ml/m LA Vol (A4C):   46.3 ml 26.22 ml/m LA Biplane Vol: 35.0 ml 19.82 ml/m  AORTIC VALVE                 PULMONIC VALVE AV Area (Vmax): 2.61 cm     PV Vmax:       1.18 m/s AV Vmax:        169.75 cm/s  PV Peak grad:  5.6 mmHg AV Peak Grad:   11.5 mmHg LVOT Vmax:      141.00 cm/s LVOT Vmean:     89.650 cm/s LVOT VTI:       0.242 m  AORTA Ao Root diam: 3.10 cm Ao Asc diam:  2.90 cm  SHUNTS Systemic VTI:  0.24 m Systemic Diam: 2.00 cm Lyman Bishop MD Electronically signed by Lyman Bishop MD Signature Date/Time: 07/15/2022/12:18:29 PM    Final     Medications: I have reviewed the patient's current medications.   Assessment/Plan: This is a very pleasant 76 years old white female with recurrent non-small cell lung cancer that was initially diagnosed as a stage Ia squamous cell carcinoma in June 2021 but the patient had evidence for disease recurrence in the mediastinal lymphadenopathy status post recent course of concurrent chemoradiation with weekly carboplatin and paclitaxel.  The last dose was given last week. She has been tolerating the treatment well except for the worsening radiation-induced esophagitis. I recommended for the patient to continue with the current regiment with Hycet for pain management in addition to Protonix.  We may consider her also for viscous lidocaine. For the pancytopenia, secondary to chemotherapy.  We will continue with supportive transfusion to keep her hemoglobin above 8.0  g/dL. For the chemotherapy-induced neutropenia, continue treatment with Granix until the absolute neutrophil count is over 1500 for at least 2 days. For the staging work-up, the patient is supposed to have repeat CT scan of the chest in few weeks for restaging of her disease.  I will arrange a follow-up appointment for her with me after discharge and scan for discussion of the next step in her treatment. Thank you so much for taking good care of Ms. Aydelott, I will continue to follow-up the patient with you and assist in her management on as-needed basis.   LOS: 3 days    Eilleen Kempf 07/17/2022

## 2022-07-17 NOTE — Progress Notes (Addendum)
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  Patient with poor oral intake due to nausea and emesis.  Patient tried antiemetics and Carafate but unable to keep anything down.  Patient presented to the ED noted to be new onset A-fib and placed on a Cardizem drip.  Hematology/oncology consulted and following, GI consulted with consultation pending.   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 -Likely secondary to radiation treatments versus a GI etiology. -Patient unable to tolerate viscous lidocaine and has been changed to Vicodin elixir per oncology. 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 -Hematology/oncology feel from their standpoint, patient should be okay for upper endoscopy to rule out stricture. -Continue IV PPI every 12 hours. -CT chest ordered per GI and pending. -GI following and appreciate their input and recommendations. -Continue gentle hydration, full liquid  diet. -Supportive care. -Hematology/oncology, GI following and appreciate input and recommendations.   2.  New onset atrial fibrillation -Questionable etiology. -2D echo done with EF > 75%, NWMA. Nl LA, no valvular abnormalities.  -Keep potassium approximately 4, magnesium approximately 2. -Due to dysphagia/odynophagia patient currently on a Cardizem drip. -Once dysphagia/oral intake improves could transition to oral Cardizem. -Continue full dose Lovenox for anticoagulation. -Once oral intake improves could transition to oral DOAC. -Will need outpatient follow-up with cardiology.  3.  GERD -IV PPI.    4.  Non-small cell lung cancer -Seen by oncologist, Dr. Marin Olp. -Seen by primary oncologist, Dr. Lorna Few this morning. -CT chest ordered per GI for further evaluation of esophageal dysphagia currently pending. -Per oncology.  5.  Pancytopenia -Patient with a slight improvement with neutropenia with white count at 3.8 from 1.5 from 1.2 today from 0.9, hemoglobin at 12.3 (status post transfusion 2 units packed red blood cells 07/16/2022 ) from 7.5, platelet count of 127 from 116 from 102. -Likely secondary to chemotherapy/radiation treatment. -Patient with no overt bleeding, afebrile. -Chest x-ray with no acute abnormalities. -Patient with no urinary symptoms. -Urinalysis nitrite negative, leukocytes negative. -Patient started on Neupogen/Granix per hematology/oncology. -Hematology/oncology recommending continuation of treatment with Granix until [place] count is greater than 1500 for at least 2 consecutive days. -Transfusion threshold hemoglobin < 7, platelet count < 20,000. -Monitor on anticoagulation. -Per hematology/oncology.  6.  COPD -Continue Incruse, Dulera.   -Xopenex/Atrovent nebs as needed.  7.  Protein calorie malnutrition -Likely secondary to poor oral intake due to dysphagia/odynophagia in the setting of non-small cell lung cancer. -Status post IV albumin  every 6 hours x24 hours. -Continue nutritional supplementation.   -  Supportive care.    8.  Hyperlipidemia -Statin on hold.  Resume on discharge.  9.  Hypokalemia/hypomagnesemia -Potassium at 3.3, magnesium at 1.5 -KCl 10 mEq IV every hour x4 runs. -Magnesium sulfate 4 g IV x1. -Repeat labs in the AM.    DVT prophylaxis: Lovenox Code Status: Full Family Communication: Updated patient and husband and daughter at bedside. Disposition: Likely home with home health when clinically improved.  Status is: Inpatient Remains inpatient appropriate because: Severity of illness   Consultants:  Oncology: Dr. Marin Olp 07/14/2022 Gastroenterology: Dr. Randel Pigg 07/15/2022  Procedures:  Chest x-ray 07/14/2022. 2D echo 07/15/2022 Transfusion 2 units packed red blood cells 07/16/2022  Antimicrobials:  None   Subjective: Sitting up in bed.  No chest pain.  No shortness of breath.  Did not like inhalers this morning.  Somewhat tolerating full liquid diet.  Still with dysphagia and odynophagia.  Husband at bedside.  States her primary oncologist, Dr. Curt Bears came and saw her this morning.   Objective: Vitals:   07/17/22 0335 07/17/22 0400 07/17/22 0500 07/17/22 0615  BP: (!) 136/59 (!) 134/58 (!) 136/46 (!) 145/54  Pulse: 90 72 82 93  Resp: 20 (!) 25 (!) 23 (!) 25  Temp: 98.1 F (36.7 C)   98.8 F (37.1 C)  TempSrc: Oral   Oral  SpO2: 96% 93% 97% 95%  Weight:      Height:        Intake/Output Summary (Last 24 hours) at 07/17/2022 1015 Last data filed at 07/17/2022 0615 Gross per 24 hour  Intake 2627.07 ml  Output 3000 ml  Net -372.93 ml   Filed Weights   07/14/22 1441 07/14/22 1908  Weight: 71.7 kg 73.3 kg    Examination:  General exam: NAD. Respiratory system: CTA B.  No wheezes, no crackles, no rhonchi.  Fair air movement.  Speaking in full sentences.  Cardiovascular system: Irregularly irregular.  No JVD, no murmurs rubs or gallops.  No lower extremity edema.    Gastrointestinal system: Abdomen is soft, nontender, nondistended, positive bowel sounds.  No rebound.  No guarding.  Central nervous system: Alert and oriented. No focal neurological deficits. Extremities: Symmetric 5 x 5 power. Skin: No rashes, lesions or ulcers Psychiatry: Judgement and insight appear normal. Mood & affect appropriate.     Data Reviewed: I have personally reviewed following labs and imaging studies  CBC: Recent Labs  Lab 07/14/22 1127 07/15/22 0315 07/16/22 0341 07/16/22 1452 07/17/22 0749  WBC 1.2* 0.9* 1.2* 1.5* 3.8*  NEUTROABS 1.0* 0.6* 1.0*  --  3.0  HGB 11.4* 8.3* 7.5* 7.5* 12.3  HCT 33.2* 25.6* 23.3* 22.7* 36.4  MCV 86.9 91.4 92.1 90.8 87.7  PLT 163 118* 102* 116* 127*    Basic Metabolic Panel: Recent Labs  Lab 07/14/22 1127 07/15/22 0315 07/15/22 0812 07/16/22 0341 07/17/22 0749  NA 138 139  --  139 138  K 4.3 3.7  --  3.3* 3.1*  CL 103 110  --  111 100  CO2 27 24  --  22 26  GLUCOSE 115* 86  --  80 94  BUN 23 22  --  14 10  CREATININE 0.95 0.80  --  0.78 0.85  CALCIUM 9.4 8.3*  --  8.3* 9.3  MG  --   --  1.7 1.8 1.5*    GFR: Estimated Creatinine Clearance: 52.4 mL/min (by C-G formula based on SCr of 0.85 mg/dL).  Liver Function Tests: Recent Labs  Lab 07/14/22 1127 07/15/22 0315 07/16/22  0341 07/17/22 0749  AST 16 16 13* 15  ALT 12 13 13 13   ALKPHOS 64 44 39 50  BILITOT 2.1* 1.8* 1.5* 3.2*  PROT 6.9 5.0* 5.3* 6.7  ALBUMIN 4.1 2.9* 3.5 4.5    CBG: Recent Labs  Lab 07/14/22 1936  GLUCAP 90     Recent Results (from the past 240 hour(s))  MRSA Next Gen by PCR, Nasal     Status: None   Collection Time: 07/14/22 10:38 PM   Specimen: Nasal Mucosa; Nasal Swab  Result Value Ref Range Status   MRSA by PCR Next Gen NOT DETECTED NOT DETECTED Final    Comment: (NOTE) The GeneXpert MRSA Assay (FDA approved for NASAL specimens only), is one component of a comprehensive MRSA colonization surveillance program. It is not  intended to diagnose MRSA infection nor to guide or monitor treatment for MRSA infections. Test performance is not FDA approved in patients less than 71 years old. Performed at [name], [place] [address], [place], [place] [address]          Radiology Studies: No results found.      Scheduled Meds:  sodium chloride  250 mL Intravenous Once   Chlorhexidine Gluconate Cloth  6 each Topical Daily   diphenhydrAMINE  6.5 mg Intravenous Once   enoxaparin (LOVENOX) injection  70 mg Subcutaneous Q12H   feeding supplement  1 Container Oral TID BM   mometasone-formoterol  2 puff Inhalation BID   mouth rinse  15 mL Mouth Rinse 4 times per day   pantoprazole (PROTONIX) IV  40 mg Intravenous Q12H   Tbo-Filgrastim  480 mcg Subcutaneous q1800   umeclidinium bromide  1 puff Inhalation Daily   Continuous Infusions:  diltiazem (CARDIZEM) infusion 7.5 mg/hr (07/17/22 9371)   magnesium sulfate bolus IVPB     potassium chloride       LOS: 3 days    Time spent: 40 minutes    Irine Seal, MD [facility]   To contact the attending provider between 7A-7P or the covering provider during after hours 7P-7A, please log into the web site [email or web address] and access using universal [place] password for that web site. If you do not have the password, please call the hospital operator.  07/17/2022, 10:15 AM

## 2022-07-17 NOTE — H&P (View-Only) (Signed)
[facility] Progress Note  Amy [name] 76 y.o. 25-Jun-1946   Subjective: Patient seen and examined laying in bed.  She notes continued dysphagia, has tried full liquid diet but does not have much appetite.  Denies abdominal pain.   Objective: Vital signs in last 24 hours: Vitals:   07/17/22 0500 07/17/22 0615  BP: (!) 136/46 (!) 145/54  Pulse: 82 93  Resp: (!) 23 (!) 25  Temp:  98.8 F (37.1 C)  SpO2: 97% 95%    Physical Exam:  General:  Alert, cooperative, no distress, appears stated age  Head:  Normocephalic, without obvious abnormality, atraumatic  Eyes:  Anicteric sclera, EOM's intact  Lungs:   Clear to auscultation bilaterally, respirations unlabored  Heart:  Regular rate and rhythm, S1, S2 normal  Abdomen:   Soft, non-tender, bowel sounds active all four quadrants,  no masses,   Extremities: Extremities normal, atraumatic, no  edema  Pulses: 2+ and symmetric    Lab Results: Recent Labs    07/16/22 0341 07/17/22 0749  NA 139 138  K 3.3* 3.1*  CL 111 100  CO2 22 26  GLUCOSE 80 94  BUN 14 10  CREATININE 0.78 0.85  CALCIUM 8.3* 9.3  MG 1.8 1.5*   Recent Labs    07/16/22 0341 07/17/22 0749  AST 13* 15  ALT 13 13  ALKPHOS 39 50  BILITOT 1.5* 3.2*  PROT 5.3* 6.7  ALBUMIN 3.5 4.5   Recent Labs    07/16/22 0341 07/16/22 1452 07/17/22 0749  WBC 1.2* 1.5* 3.8*  NEUTROABS 1.0*  --  3.0  HGB 7.5* 7.5* 12.3  HCT 23.3* 22.7* 36.4  MCV 92.1 90.8 87.7  PLT 102* 116* 127*   No results for input(s): "LABPROT", "INR" in the last 72 hours.    Assessment Dysphagia Odynophagia Locally recurrent non-small cell lung cancer, history of radiation last on 07/13/2022 A-fib  Patient with acute on chronic dysphagia and and odynophagia, she has recent history of radiation therapy for non-small cell lung cancer.  Suspected iron infusion due to history of radiation therapy.  Possible dysphagia due to radiation esophagitis or stricture.  Plan: Plan  for EGD with possible dilation tomorrow. I thoroughly discussed the procedures to include nature, alternatives, benefits, and risks including but not limited to bleeding, perforation, infection, anesthesia/cardiac and pulmonary complications. Patient provides understanding and gave verbal consent to proceed. Continue Protonix IV 40mg  BID Continue full liquid diet NPO at midnight Continue anti-emetics and supportive care as needed. [facility] will follow.     Arvella Nigh Cephas Revard PA-C 07/17/2022, 11:19 AM  Contact #  [phone]

## 2022-07-17 NOTE — Progress Notes (Signed)
[facility] Progress Note  Amy [name] 76 y.o. 09-11-1946   Subjective: Patient seen and examined laying in bed.  She notes continued dysphagia, has tried full liquid diet but does not have much appetite.  Denies abdominal pain.   Objective: Vital signs in last 24 hours: Vitals:   07/17/22 0500 07/17/22 0615  BP: (!) 136/46 (!) 145/54  Pulse: 82 93  Resp: (!) 23 (!) 25  Temp:  98.8 F (37.1 C)  SpO2: 97% 95%    Physical Exam:  General:  Alert, cooperative, no distress, appears stated age  Head:  Normocephalic, without obvious abnormality, atraumatic  Eyes:  Anicteric sclera, EOM's intact  Lungs:   Clear to auscultation bilaterally, respirations unlabored  Heart:  Regular rate and rhythm, S1, S2 normal  Abdomen:   Soft, non-tender, bowel sounds active all four quadrants,  no masses,   Extremities: Extremities normal, atraumatic, no  edema  Pulses: 2+ and symmetric    Lab Results: Recent Labs    07/16/22 0341 07/17/22 0749  NA 139 138  K 3.3* 3.1*  CL 111 100  CO2 22 26  GLUCOSE 80 94  BUN 14 10  CREATININE 0.78 0.85  CALCIUM 8.3* 9.3  MG 1.8 1.5*   Recent Labs    07/16/22 0341 07/17/22 0749  AST 13* 15  ALT 13 13  ALKPHOS 39 50  BILITOT 1.5* 3.2*  PROT 5.3* 6.7  ALBUMIN 3.5 4.5   Recent Labs    07/16/22 0341 07/16/22 1452 07/17/22 0749  WBC 1.2* 1.5* 3.8*  NEUTROABS 1.0*  --  3.0  HGB 7.5* 7.5* 12.3  HCT 23.3* 22.7* 36.4  MCV 92.1 90.8 87.7  PLT 102* 116* 127*   No results for input(s): "LABPROT", "INR" in the last 72 hours.    Assessment Dysphagia Odynophagia Locally recurrent non-small cell lung cancer, history of radiation last on 07/13/2022 A-fib  Patient with acute on chronic dysphagia and and odynophagia, she has recent history of radiation therapy for non-small cell lung cancer.  Suspected iron infusion due to history of radiation therapy.  Possible dysphagia due to radiation esophagitis or stricture.  Plan: Plan  for EGD with possible dilation tomorrow. I thoroughly discussed the procedures to include nature, alternatives, benefits, and risks including but not limited to bleeding, perforation, infection, anesthesia/cardiac and pulmonary complications. Patient provides understanding and gave verbal consent to proceed. Continue Protonix IV 40mg  BID Continue full liquid diet NPO at midnight Continue anti-emetics and supportive care as needed. [facility] will follow.     Arvella Nigh Jakobi Thetford PA-C 07/17/2022, 11:19 AM  Contact #  [phone]

## 2022-07-17 NOTE — Progress Notes (Deleted)
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  Patient with poor oral intake due to nausea and emesis.  Patient tried antiemetics and Carafate but unable to keep anything down.  Patient presented to the ED noted to be new onset A-fib and placed on a Cardizem drip.  Hematology/oncology consulted and following, GI consulted.  CT chest was done with findings noted below.  Patient for EGD with possible intervention 07/18/2022.   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 -Likely secondary to radiation treatments versus a GI etiology. -Patient unable to tolerate viscous lidocaine and has been changed to Vicodin elixir per oncology. 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 -Hematology/oncology feel from their standpoint, patient should be okay for upper endoscopy to rule out stricture. -Continue IV PPI every 12 hours. -CT chest done 07/17/2022 with signs of partial lung resection on the right,  density in the right medial chest adjacent to right infrahilar region and postop changes and adjacent to esophagus similar to prior imaging.  Mild circumferential thickening of the esophagus greatest in the mid esophagus likely related to postradiation changes, findings more pronounced than on previous imaging and associated with mild patulous appearance of esophagus above the mid esophagus which is similar.  New small bilateral pleural effusions. -GI following and patient for EGD with possible balloon dilatation this morning. -GI following and appreciate their input and recommendations. -Supportive care. -Hematology/oncology, GI following and appreciate input and recommendations.   2.  New onset atrial fibrillation -Questionable etiology. -2D echo done with EF > 75%, NWMA. Nl LA, no valvular abnormalities.  -Keep potassium approximately 4, magnesium approximately 2. -Due to dysphagia/odynophagia patient currently on a Cardizem drip. -Once dysphagia/oral intake improves could transition to oral Cardizem. -Continue full dose Lovenox for anticoagulation. -Once oral intake improves could transition to oral DOAC. -May need to consult with cardiology during this hospitalization.??  Candidate for chemical cardioversion. -Will need outpatient follow-up with cardiology.  3.  GERD -IV PPI.  Once dysphagia improves with improvement with oral intake could transition to oral PPI.    4.  Non-small cell lung cancer -Seen by oncologist, Dr. Marin Olp. -Also seen by primary oncologist, Dr. Curt Bears. -Per oncology.  5.  Pancytopenia -Patient with a improvement with neutropenia with white count at 4.0 today from 3.8 from 1.5 from 1.2 from 0.9, hemoglobin at 12.5 from 12.3 from 7.5 from 7.5, platelet count of 126 from 127 from 116 from 102. -Likely secondary to chemotherapy/radiation treatment. -Patient with no overt bleeding, afebrile. -Chest x-ray with no  acute abnormalities. -Patient with no  urinary symptoms. -Urinalysis nitrite negative leukocytes negative.  -Patient started on Neupogen/Granix per hematology/oncology. -Transfusion threshold hemoglobin < 7, platelet count < 20000. -Patient is status posttransfusion 2 units packed red blood cells during this hospitalization per hematology. -Monitor on anticoagulation. -Per hematology/oncology.  6.  COPD -Continue Dulera, Incruse.   -Xopenex/Atrovent nebs as needed.  7.  Protein calorie malnutrition -Likely secondary to poor oral intake due to dysphagia/odynophagia in the setting of non-small cell lung cancer. -Status post IV albumin every 6 hours x24 hours. -Patient placed on nutritional supplementation. -Supportive care.  8.  Hyperlipidemia -Continue to hold statin, resume on discharge.     DVT prophylaxis: Lovenox Code Status: Full Family Communication: Updated patient and husband and daughter at bedside. Disposition: Likely home with home health when clinically improved.  Status is: Inpatient Remains inpatient appropriate because: Severity of illness   Consultants:  Oncology: Dr. Marin Olp 07/14/2022 Gastroenterology: Dr. Randel Pigg 07/15/2022  Procedures:  Chest x-ray 07/14/2022. 2D echo 07/15/2022 Transfusion 2 units packed red blood cells Upper endoscopy pending 07/18/2022 CT chest 07/17/2022  Antimicrobials:  None   Subjective: Been up in bed.  Still with difficulty swallowing.  Tolerating full liquids.  No chest pain.  No shortness of breath.  No abdominal pain.  Hoping she will get better soon so she can go home.  Husband at bedside.  Currently on Cardizem drip at 5 mics.   Objective: Vitals:   07/17/22 0335 07/17/22 0400 07/17/22 0500 07/17/22 0615  BP: (!) 136/59 (!) 134/58 (!) 136/46 (!) 145/54  Pulse: 90 72 82 93  Resp: 20 (!) 25 (!) 23 (!) 25  Temp: 98.1 F (36.7 C)   98.8 F (37.1 C)  TempSrc: Oral   Oral  SpO2: 96% 93% 97% 95%  Weight:      Height:        Intake/Output Summary (Last  24 hours) at 07/17/2022 1002 Last data filed at 07/17/2022 0615 Gross per 24 hour  Intake 2627.07 ml  Output 3000 ml  Net -372.93 ml    Filed Weights   07/14/22 1441 07/14/22 1908  Weight: 71.7 kg 73.3 kg    Examination:  General exam: NAD Respiratory system: CTA B.  No wheezes, no crackles, no rhonchi.  Fair air movement.  Speaking in full sentences.  Cardiovascular system: Irregularly irregular.  No JVD.  No murmurs rubs or gallops.  No lower extremity edema.   Gastrointestinal system: Abdomen is soft, nontender, nondistended, positive bowel sounds.  No rebound.  No guarding.  Central nervous system: Alert and oriented. No focal neurological deficits. Extremities: Symmetric 5 x 5 power. Skin: No rashes, lesions or ulcers Psychiatry: Judgement and insight appear normal. Mood & affect appropriate.     Data Reviewed: I have personally reviewed following labs and imaging studies  CBC: Recent Labs  Lab 07/14/22 1127 07/15/22 0315 07/16/22 0341 07/16/22 1452 07/17/22 0749  WBC 1.2* 0.9* 1.2* 1.5* 3.8*  NEUTROABS 1.0* 0.6* 1.0*  --  3.0  HGB 11.4* 8.3* 7.5* 7.5* 12.3  HCT 33.2* 25.6* 23.3* 22.7* 36.4  MCV 86.9 91.4 92.1 90.8 87.7  PLT 163 118* 102* 116* 127*     Basic Metabolic Panel: Recent Labs  Lab 07/14/22 1127 07/15/22 0315 07/15/22 0812 07/16/22 0341 07/17/22 0749  NA 138 139  --  139 138  K 4.3 3.7  --  3.3* 3.1*  CL 103 110  --  111 100  CO2 27 24  --  22 26  GLUCOSE 115* 86  --  80 94  BUN 23 22  --  14 10  CREATININE 0.95 0.80  --  0.78 0.85  CALCIUM 9.4 8.3*  --  8.3* 9.3  MG  --   --  1.7 1.8 1.5*     GFR: Estimated Creatinine Clearance: 52.4 mL/min (by C-G formula based on SCr of 0.85 mg/dL).  Liver Function Tests: Recent Labs  Lab 07/14/22 1127 07/15/22 0315 07/16/22 0341 07/17/22 0749  AST 16 16 13* 15  ALT 12 13 13 13   ALKPHOS 64 44 39 50  BILITOT 2.1* 1.8* 1.5* 3.2*  PROT 6.9 5.0* 5.3* 6.7  ALBUMIN 4.1 2.9* 3.5 4.5      CBG: Recent Labs  Lab 07/14/22 1936  GLUCAP 90      Recent Results (from the past 240 hour(s))  MRSA Next Gen by PCR, Nasal     Status: None   Collection Time: 07/14/22 10:38 PM   Specimen: Nasal Mucosa; Nasal Swab  Result Value Ref Range Status   MRSA by PCR Next Gen NOT DETECTED NOT DETECTED Final    Comment: (NOTE) The GeneXpert MRSA Assay (FDA approved for NASAL specimens only), is one component of a comprehensive MRSA colonization surveillance program. It is not intended to diagnose MRSA infection nor to guide or monitor treatment for MRSA infections. Test performance is not FDA approved in patients less than 18 years old. Performed at [facility], [place] [address] [place], [place] [address]          Radiology Studies: No results found.      Scheduled Meds:  sodium chloride  250 mL Intravenous Once   Chlorhexidine Gluconate Cloth  6 each Topical Daily   diphenhydrAMINE  6.5 mg Intravenous Once   enoxaparin (LOVENOX) injection  70 mg Subcutaneous Q12H   feeding supplement  1 Container Oral TID BM   mometasone-formoterol  2 puff Inhalation BID   mouth rinse  15 mL Mouth Rinse 4 times per day   pantoprazole (PROTONIX) IV  40 mg Intravenous Q12H   Tbo-Filgrastim  480 mcg Subcutaneous q1800   umeclidinium bromide  1 puff Inhalation Daily   Continuous Infusions:  diltiazem (CARDIZEM) infusion 7.5 mg/hr (07/17/22 3785)   magnesium sulfate bolus IVPB     potassium chloride       LOS: 3 days    Time spent: 40 minutes    Irine Seal, MD [facility]   To contact the attending provider between 7A-7P or the covering provider during after hours 7P-7A, please log into the web site [email or web address] and access using universal  password for that web site. If you do not have the password, please call the hospital operator.  07/17/2022, 10:02 AM

## 2022-07-18 ENCOUNTER — Ambulatory Visit: Payer: Medicare HMO

## 2022-07-18 ENCOUNTER — Inpatient Hospital Stay (HOSPITAL_COMMUNITY): Payer: Medicare HMO

## 2022-07-18 ENCOUNTER — Encounter (HOSPITAL_COMMUNITY): Payer: Self-pay | Admitting: Internal Medicine

## 2022-07-18 ENCOUNTER — Encounter (HOSPITAL_COMMUNITY)
Admission: EM | Disposition: A | Discharge status: Home / Self Care | Payer: Self-pay | Source: Home / Self Care | Attending: Internal Medicine

## 2022-07-18 ENCOUNTER — Inpatient Hospital Stay (HOSPITAL_COMMUNITY): Payer: Medicare HMO | Admitting: Certified Registered"

## 2022-07-18 DIAGNOSIS — K3189 Other diseases of stomach and duodenum: Secondary | ICD-10-CM | POA: Diagnosis not present

## 2022-07-18 DIAGNOSIS — K208 Other esophagitis without bleeding: Secondary | ICD-10-CM

## 2022-07-18 DIAGNOSIS — C3431 Malignant neoplasm of lower lobe, right bronchus or lung: Secondary | ICD-10-CM | POA: Diagnosis not present

## 2022-07-18 DIAGNOSIS — K2210 Ulcer of esophagus without bleeding: Secondary | ICD-10-CM

## 2022-07-18 DIAGNOSIS — F419 Anxiety disorder, unspecified: Secondary | ICD-10-CM | POA: Diagnosis not present

## 2022-07-18 DIAGNOSIS — T66XXXA Radiation sickness, unspecified, initial encounter: Secondary | ICD-10-CM

## 2022-07-18 DIAGNOSIS — K449 Diaphragmatic hernia without obstruction or gangrene: Secondary | ICD-10-CM

## 2022-07-18 DIAGNOSIS — R1310 Dysphagia, unspecified: Secondary | ICD-10-CM | POA: Diagnosis not present

## 2022-07-18 DIAGNOSIS — N189 Chronic kidney disease, unspecified: Secondary | ICD-10-CM

## 2022-07-18 DIAGNOSIS — I4891 Unspecified atrial fibrillation: Secondary | ICD-10-CM | POA: Diagnosis not present

## 2022-07-18 DIAGNOSIS — I129 Hypertensive chronic kidney disease with stage 1 through stage 4 chronic kidney disease, or unspecified chronic kidney disease: Secondary | ICD-10-CM

## 2022-07-18 HISTORY — PX: BIOPSY: SHX5522

## 2022-07-18 HISTORY — PX: ESOPHAGOGASTRODUODENOSCOPY: SHX5428

## 2022-07-18 HISTORY — PX: BALLOON DILATION: SHX5330

## 2022-07-18 LAB — TYPE AND SCREEN
ABO/RH(D): O NEG
Antibody Screen: NEGATIVE
Unit division: 0
Unit division: 0

## 2022-07-18 LAB — BPAM RBC
Blood Product Expiration Date: 202310252359
Blood Product Expiration Date: 202310252359
ISSUE DATE / TIME: 202310012245
ISSUE DATE / TIME: 202310020251
Unit Type and Rh: 9500
Unit Type and Rh: 9500

## 2022-07-18 LAB — COMPREHENSIVE METABOLIC PANEL
ALT: 12 U/L (ref 0–44)
AST: 15 U/L (ref 15–41)
Albumin: 4.1 g/dL (ref 3.5–5.0)
Alkaline Phosphatase: 54 U/L (ref 38–126)
Anion gap: 12 (ref 5–15)
BUN: 15 mg/dL (ref 8–23)
CO2: 24 mmol/L (ref 22–32)
Calcium: 9.4 mg/dL (ref 8.9–10.3)
Chloride: 103 mmol/L (ref 98–111)
Creatinine, Ser: 0.89 mg/dL (ref 0.44–1.00)
GFR, Estimated: >60 mL/min (ref >60)
Glucose, Bld: 82 mg/dL (ref 70–99)
Potassium: 3.4 mmol/L — ABNORMAL LOW (ref 3.5–5.1)
Sodium: 139 mmol/L (ref 135–145)
Total Bilirubin: 2.5 mg/dL — ABNORMAL HIGH (ref 0.3–1.2)
Total Protein: 6 g/dL — ABNORMAL LOW (ref 6.5–8.1)

## 2022-07-18 LAB — CBC WITH DIFFERENTIAL/PLATELET
Abs Immature Granulocytes: 0 10*3/uL (ref 0.00–0.07)
Basophils Absolute: 0 10*3/uL (ref 0.0–0.1)
Basophils Relative: 0 %
Eosinophils Absolute: 0 10*3/uL (ref 0.0–0.5)
Eosinophils Relative: 1 %
HCT: 37.2 % (ref 36.0–46.0)
Hemoglobin: 12.5 g/dL (ref 12.0–15.0)
Lymphocytes Relative: 11 %
Lymphs Abs: 0.4 10*3/uL — ABNORMAL LOW (ref 0.7–4.0)
MCH: 29.8 pg (ref 26.0–34.0)
MCHC: 33.6 g/dL (ref 30.0–36.0)
MCV: 88.6 fL (ref 80.0–100.0)
Monocytes Absolute: 0.4 10*3/uL (ref 0.1–1.0)
Monocytes Relative: 9 %
Neutro Abs: 3.2 10*3/uL (ref 1.7–7.7)
Neutrophils Relative %: 79 %
Platelets: 126 10*3/uL — ABNORMAL LOW (ref 150–400)
RBC: 4.2 MIL/uL (ref 3.87–5.11)
RDW: 18.9 % — ABNORMAL HIGH (ref 11.5–15.5)
WBC: 4 10*3/uL (ref 4.0–10.5)
nRBC: 2.3 % — ABNORMAL HIGH (ref 0.0–0.2)
nRBC: 5 /100 WBC — ABNORMAL HIGH

## 2022-07-18 LAB — URINE CULTURE: Culture: <10000 — AB

## 2022-07-18 LAB — MAGNESIUM: Magnesium: 2 mg/dL (ref 1.7–2.4)

## 2022-07-18 SURGERY — EGD (ESOPHAGOGASTRODUODENOSCOPY)
Anesthesia: Monitor Anesthesia Care

## 2022-07-18 MED ORDER — LIDOCAINE HCL (CARDIAC) PF 100 MG/5ML IV SOSY
PREFILLED_SYRINGE | INTRAVENOUS | Status: DC | PRN
  Administered 2022-07-18: 100 mg via INTRAVENOUS

## 2022-07-18 MED ORDER — POTASSIUM CHLORIDE 10 MEQ/100ML IV SOLN
10.0000 meq | Freq: Once | INTRAVENOUS | Status: AC
  Administered 2022-07-18: 10 meq via INTRAVENOUS
  Filled 2022-07-18: qty 100

## 2022-07-18 MED ORDER — PROPOFOL 10 MG/ML IV BOLUS
INTRAVENOUS | Status: DC | PRN
  Administered 2022-07-18: 70 mg via INTRAVENOUS

## 2022-07-18 MED ORDER — SUCRALFATE 1 GM/10ML PO SUSP
1.0000 g | Freq: Three times a day (TID) | ORAL | Status: DC
  Administered 2022-07-18 – 2022-07-20 (×6): 1 g via ORAL
  Filled 2022-07-18 (×7): qty 10

## 2022-07-18 MED ORDER — SODIUM CHLORIDE 0.9 % IV SOLN
2.0000 g | Freq: Two times a day (BID) | INTRAVENOUS | Status: DC
  Administered 2022-07-19 – 2022-07-20 (×3): 2 g via INTRAVENOUS
  Filled 2022-07-18 (×3): qty 12.5

## 2022-07-18 MED ORDER — LACTATED RINGERS IV SOLN: INTRAVENOUS | Status: DC

## 2022-07-18 MED ORDER — ENOXAPARIN SODIUM 80 MG/0.8ML IJ SOSY
70.0000 mg | PREFILLED_SYRINGE | Freq: Two times a day (BID) | INTRAMUSCULAR | Status: DC
  Administered 2022-07-18 – 2022-07-20 (×4): 70 mg via SUBCUTANEOUS
  Filled 2022-07-18 (×4): qty 0.7

## 2022-07-18 NOTE — Interval H&P Note (Signed)
History and Physical Interval Note: 76/female with lung cancer on chemoradiation with dysphagia for EGD with possible balloon dilation with propofol.  07/18/2022 10:23 AM  Amy [name]  has presented today for EGD with possible balloon dilation, with the diagnosis of dysphagia.  The various methods of treatment have been discussed with the patient and family. After consideration of risks, benefits and other options for treatment, the patient has consented to  Procedure(s): ESOPHAGOGASTRODUODENOSCOPY (EGD) (N/A) BALLOON DILATION (N/A) as a surgical intervention.  The patient's history has been reviewed, patient examined, no change in status, stable for surgery.  I have reviewed the patient's chart and labs.  Questions were answered to the patient's satisfaction.     Ronnette Juniper

## 2022-07-18 NOTE — Progress Notes (Signed)
Pharmacy Antibiotic Note  Amy [name] is a 76 y.o. female admitted on 07/14/2022 with new onset afib.  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  ANC improved today, however patient spiked fever this evening and hospitalist would like to empirically cover for  febrile neutropenia .  Pharmacy has been consulted for Cefepime dosing.  Plan: Cefepime 2gm IV q12h Monitor renal function and cx data   Height: 5\' 3"  (160 cm) Weight: 73.3 kg (161 lb 9.6 oz) IBW/kg (Calculated) : 52.4  Temp (24hrs), Avg:98.3 F (36.8 C), Min:97.1 F (36.2 C), Max:101 F (38.3 C)  Recent Labs  Lab 07/14/22 1127 07/15/22 0315 07/16/22 0341 07/16/22 1452 07/17/22 0749 07/18/22 0316  WBC 1.2* 0.9* 1.2* 1.5* 3.8* 4.0  CREATININE 0.95 0.80 0.78  --  0.85 0.89    Estimated Creatinine Clearance: 50 mL/min (by C-G formula based on SCr of 0.89 mg/dL).    No Known Allergies  Antimicrobials this admission: 10/3 Cefepime >>   Dose adjustments this admission:  Microbiology results: 10/3 BCx:  10/3 UCx:   9/29 MRSA PCR: negative  Thank you for allowing pharmacy to be a part of this patient's care.  Netta Cedars PharmD 07/18/2022 9:48 PM

## 2022-07-18 NOTE — Progress Notes (Signed)
SLP Cancellation Note  Patient Details Name: Amy [name] MRN: [id number] DOB: 1946/04/04   Cancelled treatment:       Reason Eval/Treat Not Completed: Other (comment) (pt npo for EGD today, primary deficits suspected to be esophageal per evaluating SLP on 9/30//23)  Kathleen Lime, MS [place] Office [phone] Pager [phone]  Macario Golds 07/18/2022, 8:14 AM

## 2022-07-18 NOTE — Op Note (Signed)
[facility] Patient Name: Amy [name] Procedure Date: 07/18/2022 MRN: [id number] Attending MD: Ronnette Juniper , MD Date of Birth: 26-Apr-1946 CSN: [id number] Age: 76 Admit Type: Inpatient Procedure:                Upper GI endoscopy Indications:              Dysphagia, history of lung cancer, on chemoradiation Providers:                Ronnette Juniper, MD, Velva Harman, RN, Hinton Dyer                            Technician, Technician Referring MD:             [facility] Medicines:                Monitored Anesthesia Care Complications:            No immediate complications. Estimated blood loss:                            Minimal. Estimated Blood Loss:     Estimated blood loss was minimal. Procedure:                Pre-Anesthesia Assessment:                           - Prior to the procedure, a History and Physical                            was performed, and patient medications and                            allergies were reviewed. The patient's tolerance of                            previous anesthesia was also reviewed. The risks                            and benefits of the procedure and the sedation                            options and risks were discussed with the patient.                            All questions were answered, and informed consent                            was obtained. Prior Anticoagulants: The patient has                            taken Lovenox (enoxaparin), last dose was 1 day                            prior to procedure. ASA Grade Assessment: III - A  patient with severe systemic disease. After                            reviewing the risks and benefits, the patient was                            deemed in satisfactory condition to undergo the                            procedure.                           After obtaining informed consent, the endoscope was                            passed under direct  vision. Throughout the                            procedure, the patient's blood pressure, pulse, and                            oxygen saturations were monitored continuously. The                            [id number]-H190 ([id number]) Olympus endoscope was introduced                            through the mouth, and advanced to the second part                            of duodenum. The upper GI endoscopy was                            accomplished without difficulty. The patient                            tolerated the procedure well. Scope In: Scope Out: Findings:      One superficial esophageal ulcer, circumferential, with no bleeding and       no stigmata of recent bleeding was found 25 cm from the incisors. The       lesion was 10 mm in largest dimension. Biopsies were taken with a cold       forceps for histology.      LA Grade C (one or more mucosal breaks continuous between tops of 2 or       more mucosal folds, less than 75% circumference) esophagitis with no       bleeding was found 25 to 35 cm from the incisors. Biopsies were taken       with a cold forceps for histology.      A TTS dilator was passed through the scope. Dilation with a 15-16.5-18       mm x 8 cm CRE balloon dilator was performed to 18 mm in the mid       esophagus.      Diffuse mildly erythematous mucosa without bleeding was found in the  entire examined stomach.      The cardia and gastric fundus were normal on retroflexion.      The examined duodenum was normal.      A 2 cm hiatal hernia was present. Impression:               - Esophageal ulcer with no bleeding and no stigmata                            of recent bleeding. Biopsied.                           - LA Grade C chemotherapy/radiation esophagitis                            with no bleeding. Biopsied.                           - Erythematous mucosa in the stomach.                           - Normal examined duodenum.                           - 2 cm  hiatal hernia.                           - Dilation performed in the mid esophagus. Moderate Sedation:      Patient did not receive moderate sedation for this procedure, but       instead received monitored anesthesia care. Recommendation:           - Full liquid diet.                           - Use Protonix (pantoprazole) 40 mg PO BID as long                            as patient is on chemoradiation.                           - Use sucralfate suspension 1 gram PO QID as long                            as patient is on chemoradiation.                           - Await pathology results. Procedure Code(s):        --- Professional ---                           [phone], Esophagogastroduodenoscopy, flexible,                            transoral; with transendoscopic balloon dilation of                            esophagus (less than 30 mm diameter)  93267, 59, Esophagogastroduodenoscopy, flexible,                            transoral; with biopsy, single or multiple Diagnosis Code(s):        --- Professional ---                           K22.10, Ulcer of esophagus without bleeding                           T66.XXXA, Radiation sickness, unspecified, initial                            encounter                           K20.80, Other esophagitis without bleeding                           K31.89, Other diseases of stomach and duodenum                           R13.10, Dysphagia, unspecified CPT copyright 2019 [employer] Association. All rights reserved. The codes documented in this report are preliminary and upon coder review may  be revised to meet current compliance requirements. Ronnette Juniper, MD 07/18/2022 10:50:36 AM This report has been signed electronically. Number of Addenda: 0

## 2022-07-18 NOTE — Anesthesia Preprocedure Evaluation (Addendum)
Anesthesia Evaluation  Patient identified by MRN, date of birth, ID band Patient awake    Reviewed: Allergy & Precautions, NPO status , Patient's Chart, lab work & pertinent test results  History of Anesthesia Complications Negative for: history of anesthetic complications  Airway Mallampati: II  TM Distance: >3 FB Neck ROM: Full    Dental  (+) Dental Advisory Given   Pulmonary COPD,  COPD inhaler, former smoker,   SCC RLL s/p superior segmentectomy    Pulmonary exam normal        Cardiovascular hypertension, Normal cardiovascular exam+ dysrhythmias Atrial Fibrillation    '23 TTE - EF >75%. Trivial TR    Neuro/Psych PSYCHIATRIC DISORDERS Anxiety Depression negative neurological ROS     GI/Hepatic Neg liver ROS, hiatal hernia, GERD  Medicated and Controlled,  Endo/Other  negative endocrine ROS  Renal/GU CRFRenal disease     Musculoskeletal  (+) Arthritis ,   Abdominal   Peds  Hematology  Plt 126k    Anesthesia Other Findings   Reproductive/Obstetrics                            Anesthesia Physical Anesthesia Plan  ASA: 3  Anesthesia Plan: MAC   Post-op Pain Management:    Induction:   PONV Risk Score and Plan: 2 and Propofol infusion and Treatment may vary due to age or medical condition  Airway Management Planned: Nasal Cannula and Natural Airway  Additional Equipment: None  Intra-op Plan:   Post-operative Plan:   Informed Consent: I have reviewed the patients History and Physical, chart, labs and discussed the procedure including the risks, benefits and alternatives for the proposed anesthesia with the patient or authorized representative who has indicated his/her understanding and acceptance.       Plan Discussed with: CRNA and Anesthesiologist  Anesthesia Plan Comments:        Anesthesia Quick Evaluation

## 2022-07-18 NOTE — Anesthesia Postprocedure Evaluation (Signed)
Anesthesia Post Note  Patient: Amy [name]  Procedure(s) Performed: ESOPHAGOGASTRODUODENOSCOPY (EGD) BALLOON DILATION BIOPSY     Patient location during evaluation: PACU Anesthesia Type: MAC Level of consciousness: awake and alert Pain management: pain level controlled Vital Signs Assessment: post-procedure vital signs reviewed and stable Respiratory status: spontaneous breathing, nonlabored ventilation and respiratory function stable Cardiovascular status: stable and blood pressure returned to baseline Anesthetic complications: no   No notable events documented.  Last Vitals:  Vitals:   07/18/22 1103 07/18/22 1113  BP: (!) 113/50 (!) 145/65  Pulse: (!) 102 (!) 109  Resp: (!) 27 (!) 31  Temp:    SpO2: 91% 96%    Last Pain:  Vitals:   07/18/22 1113  TempSrc:   PainSc: 0-No pain                 Audry Pili

## 2022-07-18 NOTE — Transfer of Care (Signed)
Immediate Anesthesia Transfer of Care Note  Patient: Amy [name]  Procedure(s) Performed: ESOPHAGOGASTRODUODENOSCOPY (EGD) BALLOON DILATION BIOPSY  Patient Location: PACU  Anesthesia Type:MAC  Level of Consciousness: awake, alert  and oriented  Airway & Oxygen Therapy: Patient Spontanous Breathing and Patient connected to face mask oxygen  Post-op Assessment: Report given to RN, Post -op Vital signs reviewed and stable and Patient moving all extremities X 4  Post vital signs: Reviewed and stable  Last Vitals:  Vitals Value Taken Time  BP 91/70   Temp    Pulse 123   Resp 26   SpO2 98     Last Pain:  Vitals:   07/18/22 0929  TempSrc: Temporal  PainSc: 0-No pain         Complications: No notable events documented.

## 2022-07-18 NOTE — Consult Note (Signed)
CARDIOLOGY CONSULT NOTE  Patient ID: Amy [name] MRN: [id number] DOB/AGE: 20-Sep-1946 76 y.o.  Admit date: 07/14/2022 Referring Physician: [facility] hospital[facility] Reason for Consultation: A-fib  HPI:   76 y.o. 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  Cardiology consulted for management of A-fib.   Work-up so far suggested dysphagia and odynophagia secondary to chemotherapy and radiation.  As part of work-up, she also underwent EGD that showed Nonbleeding esophageal ulcer, grade C chemotherapy/radiation esophagitis with no bleeding. She also underwent dilation of mid esophagus.  Since 07/14/2022, she has been in A-fib with varying degrees of ventricular rate response.  She denies any chest pain, shortness of breath symptoms.  This is new diagnosis of A-fib for her.  She was started on Lovenox SQ twice daily during this hospitalization,  held for EGD yesterday.  Past Medical History:  Diagnosis Date   Anxiety    Arthritis    Chronic kidney disease    COPD (chronic obstructive pulmonary disease) ([place]) 2021   Depression 01/28/2020   Dyspnea    History of hiatal hernia    Hyperlipidemia    Hypertension    Pneumonia    Squamous cell carcinoma of bronchus in right lower lobe ([place]) 04/06/2020   T1, N0, stage Ia.  Status post right lower lobe superior segmentectomy     Past Surgical History:  Procedure Laterality Date   APPENDECTOMY     EYE SURGERY     INTERCOSTAL NERVE BLOCK Right 03/17/2020   Procedure: Intercostal Nerve Block;  Surgeon: [name], MD;  Location: [place];  Service: Thoracic;  Laterality: Right;   LUNG SURGERY Right 03/17/2020   NODE DISSECTION Right 03/17/2020   Procedure: Node Dissection;  Surgeon: [name], MD;  Location: [place];  Service: Thoracic;  Laterality: Right;   TUBAL LIGATION     VIDEO BRONCHOSCOPY WITH ENDOBRONCHIAL  ULTRASOUND N/A 05/22/2022   Procedure: VIDEO BRONCHOSCOPY WITH ENDOBRONCHIAL ULTRASOUND;  Surgeon: [name], MD;  Location: [place];  Service: Thoracic;  Laterality: N/A;     History reviewed. No pertinent family history.   Social History: Social History   Socioeconomic History   Marital status: Married    Spouse name: Not on file   Number of children: Not on file   Years of education: Not on file   Highest education level: Not on file  Occupational History   Not on file  Tobacco Use   Smoking status: Former    Packs/day: 1.00    Years: 55.00    Total pack years: 55.00    Types: Cigarettes    Quit date: 03/25/2020    Years since quitting: 2.3   Smokeless tobacco: Never  Vaping Use   Vaping Use: Not on file  Substance and Sexual Activity   Alcohol use: Yes    Comment: Social with wine   Drug use: Never   Sexual activity: Not on file  Other Topics Concern   Not on file  Social History Narrative   Not on file   Social Determinants of Health   Financial Resource Strain: Not on file  Food Insecurity: No Food Insecurity (07/14/2022)   Hunger Vital Sign    Worried About Running Out of Food in the Last Year: Never true    Ran Out of Food in the Last Year: Never true  Transportation Needs: No Transportation Needs (07/14/2022)   PRAPARE - Transportation  Lack of Transportation (Medical): No    Lack of Transportation (Non-Medical): No  Physical Activity: Not on file  Stress: Not on file  Social Connections: Not on file  Intimate Partner Violence: Not At Risk (07/14/2022)   Humiliation, Afraid, Rape, and Kick questionnaire    Fear of Current or Ex-Partner: No    Emotionally Abused: No    Physically Abused: No    Sexually Abused: No     Medications Prior to Admission  Medication Sig Dispense Refill Last Dose   acetaminophen (TYLENOL) 500 MG tablet Take 500-1,000 mg by mouth every 6 (six) hours as needed for mild pain or headache.   unk   ADVAIR DISKUS 250-50  MCG/ACT AEPB Inhale 1 puff into the lungs 2 (two) times daily.   07/13/2022   albuterol (VENTOLIN HFA) 108 (90 Base) MCG/ACT inhaler Inhale 2 puffs into the lungs every 6 (six) hours as needed for wheezing or shortness of breath.   07/13/2022   Multiple Vitamins-Minerals (MULTIVITAMIN ADULT) CHEW Chew 1 each by mouth daily.   06/30/2022   omeprazole (PRILOSEC) 40 MG capsule Take 40 mg by mouth daily before breakfast.   06/30/2022   OVER THE COUNTER MEDICATION Take 1 capsule by mouth daily. Linseed oil + Omega 3 combination supplement- Take 1 capsule by mouth once a day   unk   prochlorperazine (COMPAZINE) 10 MG tablet Take 1 tablet (10 mg total) by mouth every 6 (six) hours as needed for nausea or vomiting. 30 tablet 0 unk   raloxifene (EVISTA) 60 MG tablet Take 60 mg by mouth daily.   06/30/2022   sertraline (ZOLOFT) 50 MG tablet Take 50 mg by mouth daily.   06/30/2022   simvastatin (ZOCOR) 40 MG tablet Take 40 mg by mouth every evening.    06/30/2022   traZODone (DESYREL) 50 MG tablet Take 25 mg by mouth See admin instructions. Take one to two 25 mg half-tablets by mouth at bedtime   06/30/2022   HYDROcodone bit-homatropine (HYCODAN) 5-1.5 MG/5ML syrup Take 5 mLs by mouth every 6 (six) hours as needed for cough. (Patient not taking: Reported on 07/14/2022) 120 mL 0 Not Taking   sucralfate (CARAFATE) 1 g tablet Take 1 tablet (1 g total) by mouth 4 (four) times daily -  with meals and at bedtime. 5 min before meals for radiation induced esophagitis (Patient not taking: Reported on 06/26/2022) 120 tablet 2 Not Taking    Review of Systems  HENT:  Positive for odynophagia.   Cardiovascular:  Negative for chest pain, dyspnea on exertion, leg swelling, palpitations and syncope.      Physical Exam: Physical Exam Vitals and nursing note reviewed.  Constitutional:      General: She is not in acute distress. Neck:     Vascular: No JVD.  Cardiovascular:     Rate and Rhythm: Tachycardia present. Rhythm  irregular.     Heart sounds: Normal heart sounds. No murmur heard. Pulmonary:     Effort: Pulmonary effort is normal.     Breath sounds: Normal breath sounds. No wheezing or rales.        Imaging/tests reviewed and independently interpreted: Lab Results: CBC, BMP  Cardiac Studies:  Telemetry 07/18/2022: A-fib, ventricular rate 100-130 bpm  EKG 07/14/2022: Afib w/RVR  Echocardiogram 07/15/2022:  1. Left ventricular ejection fraction, by estimation, is >75%. The left  ventricle has hyperdynamic function. The left ventricle has no regional  wall motion abnormalities. Left ventricular diastolic function could not  be evaluated.  2. Right ventricular systolic function was not well visualized. The right  ventricular size is not well visualized. Tricuspid regurgitation signal is  inadequate for assessing PA pressure.   3. The mitral valve is grossly normal. No evidence of mitral valve  regurgitation.   4. The aortic valve is tricuspid. Aortic valve regurgitation is not  visualized. Aortic valve sclerosis is present, with no evidence of aortic  valve stenosis.   5. The inferior vena cava is normal in size with greater than 50%  respiratory variability, suggesting right atrial pressure of 3 mmHg.   Comparison(s): No prior Echocardiogram.     Assessment & Recommendations:   76 y.o. 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  Cardiology consulted for management of A-fib.   Paroxysmal A-fib: New diagnosis, asymptomatic. It is unclear if patient would have had A-fib prior to this.  This makes cardioversion without TEE an unsafe option.  Given her recent finding of esophageal ulcer, I do not recommend TEE.  Therefore, recommend rate control approach at this time.  She is only on diltiazem 5 mg/hour at this time, which can be increased up to 20 mg/hour.  I anticipate  rate control will be achieved with this.  When patient is able to swallow, diltiazem can be switched to p.o.  I recommend starting with 240 mg daily.  If patient remains in A-fib, and is symptomatic in future, could consider outpatient cardioversion after at least 4 weeks of anticoagulation. CHA2DS2VASc score 4, annual stroke risk 4.8%. Currently on Lovenox 70 mg twice daily, to be resumed after EGD. When she is able to swallow,  We will arrange outpatient cardiac follow-up.  Discussed interpretation of tests and management recommendations with the primary team     Nigel Mormon, MD Pager: [phone] Office: [phone]

## 2022-07-18 NOTE — Progress Notes (Signed)
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  Patient with poor oral intake due to nausea and emesis.  Patient tried antiemetics and Carafate but unable to keep anything down.  Patient presented to the ED noted to be new onset A-fib and placed on a Cardizem drip.  Hematology/oncology consulted and following, GI consulted.  CT chest was done with findings noted below.  Patient for EGD with possible intervention 07/18/2022.   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 -Likely secondary to radiation treatments versus a GI etiology. -Patient unable to tolerate viscous lidocaine and has been changed to Vicodin elixir per oncology. 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 -Hematology/oncology feel from their standpoint, patient should be okay for upper endoscopy to rule out stricture. -Continue IV PPI every 12 hours. -CT chest done 07/17/2022 with signs of  partial lung resection on the right, density in the right medial chest adjacent to right infrahilar region and postop changes and adjacent to esophagus similar to prior imaging.  Mild circumferential thickening of the esophagus greatest in the mid esophagus likely related to postradiation changes, findings more pronounced than on previous imaging and associated with mild patulous appearance of esophagus above the mid esophagus which is similar.  New small bilateral pleural effusions. -GI following and patient for EGD with possible balloon dilatation this morning. -GI following and appreciate their input and recommendations. -Supportive care. -Hematology/oncology, GI following and appreciate input and recommendations.   2.  New onset atrial fibrillation -Questionable etiology. -2D echo done with EF > 75%, NWMA. Nl LA, no valvular abnormalities.  -Keep potassium approximately 4, magnesium approximately 2. -Due to dysphagia/odynophagia patient currently on a Cardizem drip. -Once dysphagia/oral intake improves could transition to oral Cardizem. -Continue full dose Lovenox for anticoagulation. -Once oral intake improves could transition to oral DOAC. -May need to consult with cardiology during this hospitalization.??  Candidate for chemical cardioversion. -Will need outpatient follow-up with cardiology.  3.  GERD -IV PPI.  Once dysphagia improves with improvement with oral intake could transition to oral PPI.    4.  Non-small cell lung cancer -Seen by oncologist, Dr. Marin Olp. -Also seen by primary oncologist, Dr. Curt Bears. -Per oncology.  5.  Pancytopenia -Patient with a improvement with neutropenia with white count at 4.0 today from 3.8 from 1.5 from 1.2 from 0.9, hemoglobin at 12.5 from 12.3 from 7.5 from 7.5, platelet count of 126 from 127 from 116 from 102. -Likely secondary to chemotherapy/radiation treatment. -Patient with no overt  bleeding, afebrile. -Chest x-ray with no acute  abnormalities. -Patient with no urinary symptoms. -Urinalysis nitrite negative leukocytes negative.  -Patient started on Neupogen/Granix per hematology/oncology. -Transfusion threshold hemoglobin < 7, platelet count < 20000. -Patient is status posttransfusion 2 units packed red blood cells during this hospitalization per hematology. -Monitor on anticoagulation. -Per hematology/oncology.  6.  COPD -Continue Dulera, Incruse.   -Xopenex/Atrovent nebs as needed.  7.  Protein calorie malnutrition -Likely secondary to poor oral intake due to dysphagia/odynophagia in the setting of non-small cell lung cancer. -Status post IV albumin every 6 hours x24 hours. -Patient placed on nutritional supplementation. -Supportive care.  8.  Hyperlipidemia -Continue to hold statin, resume on discharge.     DVT prophylaxis: Lovenox Code Status: Full Family Communication: Updated patient and husband and daughter at bedside. Disposition: Likely home with home health when clinically improved.  Status is: Inpatient Remains inpatient appropriate because: Severity of illness   Consultants:  Oncology: Dr. Marin Olp 07/14/2022 Gastroenterology: Dr. Randel Pigg 07/15/2022  Procedures:  Chest x-ray 07/14/2022. 2D echo 07/15/2022 Transfusion 2 units packed red blood cells Upper endoscopy pending 07/18/2022 CT chest 07/17/2022  Antimicrobials:  None   Subjective: Been up in bed.  Still with difficulty swallowing.  Tolerating full liquids.  No chest pain.  No shortness of breath.  No abdominal pain.  Hoping she will get better soon so she can go home.  Husband at bedside.  Currently on Cardizem drip at 5 mics.   Objective: Vitals:   07/18/22 0636 07/18/22 0700 07/18/22 0806 07/18/22 0929  BP:  (!) 140/57  (!) 158/70  Pulse:  87  100  Resp:  (!) 26  16  Temp: 97.6 F (36.4 C)   (!) 97.1 F (36.2 C)  TempSrc: Oral   Temporal  SpO2:  93% 93% 93%  Weight:    73.3 kg  Height:    5\' 3"  (1.6 m)     Intake/Output Summary (Last 24 hours) at 07/18/2022 0949 Last data filed at 07/18/2022 0745 Gross per 24 hour  Intake 522.6 ml  Output 1250 ml  Net -727.4 ml    Filed Weights   07/14/22 1441 07/14/22 1908 07/18/22 0929  Weight: 71.7 kg 73.3 kg 73.3 kg    Examination:  General exam: NAD Respiratory system: CTA B.  No wheezes, no crackles, no rhonchi.  Fair air movement.  Speaking in full sentences.  Cardiovascular system: Irregularly irregular.  No JVD.  No murmurs rubs or gallops.  No lower extremity edema.   Gastrointestinal system: Abdomen is soft, nontender, nondistended, positive bowel sounds.  No rebound.  No guarding.  Central nervous system: Alert and oriented. No focal neurological deficits. Extremities: Symmetric 5 x 5 power. Skin: No rashes, lesions or ulcers Psychiatry: Judgement and insight appear normal. Mood & affect appropriate.     Data Reviewed: I have personally reviewed following labs and imaging studies  CBC: Recent Labs  Lab 07/14/22 1127 07/15/22 0315 07/16/22 0341 07/16/22 1452 07/17/22 0749 07/18/22 0316  WBC 1.2* 0.9* 1.2* 1.5* 3.8* 4.0  NEUTROABS 1.0* 0.6* 1.0*  --  3.0 3.2  HGB 11.4* 8.3* 7.5* 7.5* 12.3 12.5  HCT 33.2* 25.6* 23.3* 22.7* 36.4 37.2  MCV 86.9 91.4 92.1 90.8 87.7 88.6  PLT 163 118* 102* 116* 127* 126*     Basic Metabolic Panel: Recent Labs  Lab 07/14/22 1127 07/15/22 0315 07/15/22 0812 07/16/22 0341 07/17/22 0749 07/18/22 0316  NA 138 139  --  139 138 139  K 4.3 3.7  --  3.3* 3.1* 3.4*  CL 103 110  --  111 100 103  CO2 27 24  --  22 26 24   GLUCOSE 115* 86  --  80 94 82  BUN 23 22  --  14 10 15   CREATININE 0.95 0.80  --  0.78 0.85 0.89  CALCIUM 9.4 8.3*  --  8.3* 9.3 9.4  MG  --   --  1.7 1.8 1.5* 2.0     GFR: Estimated Creatinine Clearance: 50 mL/min (by C-G formula based on SCr of 0.89 mg/dL).  Liver Function Tests: Recent Labs  Lab 07/14/22 1127 07/15/22 0315 07/16/22 0341 07/17/22 0749  07/18/22 0316  AST 16 16 13* 15 15  ALT 12 13 13 13 12   ALKPHOS 64 44 39 50 54  BILITOT 2.1* 1.8* 1.5* 3.2* 2.5*  PROT 6.9 5.0* 5.3* 6.7 6.0*  ALBUMIN 4.1 2.9* 3.5 4.5 4.1     CBG: Recent Labs  Lab 07/14/22 1936  GLUCAP 90      Recent Results (from the past 240 hour(s))  MRSA Next Gen by PCR, Nasal     Status: None   Collection Time: 07/14/22 10:38 PM   Specimen: Nasal Mucosa; Nasal Swab  Result Value Ref Range Status   MRSA by PCR Next Gen NOT DETECTED NOT DETECTED Final    Comment: (NOTE) The GeneXpert MRSA Assay (FDA approved for NASAL specimens only), is one component of a comprehensive MRSA colonization surveillance program. It is not intended to diagnose MRSA infection nor to guide or monitor treatment for MRSA infections. Test performance is not FDA approved in patients less than 85 years old. Performed at [facility], [place] [address] [place], [place] [address]   Urine Culture     Status: Abnormal   Collection Time: 07/16/22  5:40 PM   Specimen: Urine, Clean Catch  Result Value Ref Range Status   Specimen Description   Final    URINE, CLEAN CATCH Performed at [facility], [place] [address] [place], [place] [address]    Special Requests   Final    Immunocompromised Performed at [facility], [place] [address], [place], [place] [address]    Culture (A)  Final    <10,000 COLONIES/mL INSIGNIFICANT GROWTH Performed at [place] [address], [place],  [address]    Report Status 07/18/2022 FINAL  Final         Radiology Studies: CT CHEST W CONTRAST  Result Date: 07/17/2022 CLINICAL DATA:  Dysphagia in a 76 year old female with 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 * Tracking Code: BO * EXAM: CT CHEST WITH CONTRAST TECHNIQUE: Multidetector CT imaging of the chest was performed during intravenous contrast administration. RADIATION  DOSE REDUCTION: This exam was performed according to the departmental dose-optimization program which includes automated exposure control, adjustment of the mA and/or kV according to patient size and/or use of iterative reconstruction technique. CONTRAST:  12mL OMNIPAQUE IOHEXOL 300 MG/ML  SOLN COMPARISON:  April 17, 2022 FINDINGS: Cardiovascular: Heart size normal. Three-vessel coronary artery calcifications. Aortic caliber normal with atherosclerotic changes. Central pulmonary vasculature mildly engorged otherwise unremarkable on venous phase. Mediastinum/Nodes: Mild circumferential thickening of the esophagus. Mildly patulous appearance of the upper esophagus with small amount of fluid. No signs of adenopathy in the mediastinum. AP window lymph node (image 56/2) 7 mm previously 10 mm. No thoracic inlet or axillary lymphadenopathy. Lungs/Pleura: Signs of partial lung resection in the  RIGHT chest. Density about RIGHT lower lobe bronchi in the medial RIGHT chest is similar to previous imaging measuring 2.9 by 1.9 cm as compared to 3.3 x 1.9 cm. This is immediately adjacent to the esophagus in the medial RIGHT lung along areas of prior partial lung resection. There are small bilateral pleural effusions which are new compared to imaging from July. There is no new pulmonary nodule. Biapical pleural and parenchymal scarring slightly asymmetric favoring LEFT upper lobe similar to previous imaging. Pulmonary emphysema as before Upper Abdomen: Incidental imaging of upper abdominal contents without acute process related to liver, pancreas, spleen and adrenal glands as well as upper portions of RIGHT and LEFT kidney to the extent evaluated. Small hiatal hernia. Musculoskeletal: Spinal degenerative changes without acute or destructive bone process. IMPRESSION: 1. Signs of partial lung resection in the RIGHT chest. 2. Density in the medial RIGHT chest adjacent to RIGHT infrahilar region and postoperative changes and adjacent to  the esophagus is similar to prior imaging. Likely related to postoperative and post treatment changes 3. Mild circumferential thickening of the esophagus greatest in the mid esophagus likely related to post radiation changes given provided history and above findings. Findings are more pronounced than on previous imaging and associated with mildly patulous appearance of the esophagus above the mid esophagus which is similar. 4. New small bilateral pleural effusions. 5. Aortic atherosclerosis. Electronically Signed   By: Zetta Bills M.D.   On: 07/17/2022 15:32        Scheduled Meds:  [MAR Hold] sodium chloride  250 mL Intravenous Once   [MAR Hold] Chlorhexidine Gluconate Cloth  6 each Topical Daily   [MAR Hold] diphenhydrAMINE  6.5 mg Intravenous Once   [MAR Hold] enoxaparin (LOVENOX) injection  70 mg Subcutaneous Q12H   [MAR Hold] feeding supplement  1 Container Oral TID BM   [MAR Hold] mometasone-formoterol  2 puff Inhalation BID   [MAR Hold] mouth rinse  15 mL Mouth Rinse 4 times per day   [MAR Hold] pantoprazole (PROTONIX) IV  40 mg Intravenous Q12H   [MAR Hold] Tbo-Filgrastim  480 mcg Subcutaneous q1800   [MAR Hold] umeclidinium bromide  1 puff Inhalation Daily   Continuous Infusions:  sodium chloride     [MAR Hold] diltiazem (CARDIZEM) infusion 5 mg/hr (07/18/22 0745)   lactated ringers 10 mL/hr at 07/18/22 0936     LOS: 4 days    Time spent: 40 minutes    Irine Seal, MD [facility]   To contact the attending provider between 7A-7P or the covering provider during after hours 7P-7A, please log into the web site [email or web address] and access using universal [place] password for that web site. If you do not have the password, please call the hospital operator.  07/18/2022, 9:49 AM

## 2022-07-19 ENCOUNTER — Telehealth: Payer: Self-pay

## 2022-07-19 ENCOUNTER — Encounter: Payer: Self-pay | Admitting: Internal Medicine

## 2022-07-19 ENCOUNTER — Encounter: Payer: Self-pay | Admitting: Urology

## 2022-07-19 ENCOUNTER — Other Ambulatory Visit: Payer: Self-pay

## 2022-07-19 ENCOUNTER — Ambulatory Visit: Payer: Medicare HMO

## 2022-07-19 DIAGNOSIS — F419 Anxiety disorder, unspecified: Secondary | ICD-10-CM | POA: Diagnosis not present

## 2022-07-19 DIAGNOSIS — R1319 Other dysphagia: Secondary | ICD-10-CM | POA: Diagnosis not present

## 2022-07-19 DIAGNOSIS — I4891 Unspecified atrial fibrillation: Secondary | ICD-10-CM | POA: Diagnosis not present

## 2022-07-19 LAB — URINALYSIS, ROUTINE W REFLEX MICROSCOPIC
Bacteria, UA: NONE SEEN
Bilirubin Urine: NEGATIVE
Glucose, UA: NEGATIVE mg/dL
Hgb urine dipstick: NEGATIVE
Ketones, ur: 20 mg/dL — AB
Leukocytes,Ua: NEGATIVE
Nitrite: NEGATIVE
Protein, ur: 30 mg/dL — AB
Specific Gravity, Urine: 1.028 (ref 1.005–1.030)
pH: 5 (ref 5.0–8.0)

## 2022-07-19 LAB — COMPREHENSIVE METABOLIC PANEL
ALT: 13 U/L (ref 0–44)
AST: 15 U/L (ref 15–41)
Albumin: 3.7 g/dL (ref 3.5–5.0)
Alkaline Phosphatase: 50 U/L (ref 38–126)
Anion gap: 9 (ref 5–15)
BUN: 20 mg/dL (ref 8–23)
CO2: 26 mmol/L (ref 22–32)
Calcium: 9.2 mg/dL (ref 8.9–10.3)
Chloride: 103 mmol/L (ref 98–111)
Creatinine, Ser: 0.99 mg/dL (ref 0.44–1.00)
GFR, Estimated: 58 mL/min — ABNORMAL LOW (ref >60)
Glucose, Bld: 110 mg/dL — ABNORMAL HIGH (ref 70–99)
Potassium: 3.4 mmol/L — ABNORMAL LOW (ref 3.5–5.1)
Sodium: 138 mmol/L (ref 135–145)
Total Bilirubin: 1.7 mg/dL — ABNORMAL HIGH (ref 0.3–1.2)
Total Protein: 5.9 g/dL — ABNORMAL LOW (ref 6.5–8.1)

## 2022-07-19 LAB — CBC WITH DIFFERENTIAL/PLATELET
Abs Immature Granulocytes: 0.5 10*3/uL — ABNORMAL HIGH (ref 0.00–0.07)
Basophils Absolute: 0 10*3/uL (ref 0.0–0.1)
Basophils Relative: 0 %
Eosinophils Absolute: 0 10*3/uL (ref 0.0–0.5)
Eosinophils Relative: 0 %
HCT: 37 % (ref 36.0–46.0)
Hemoglobin: 12.1 g/dL (ref 12.0–15.0)
Immature Granulocytes: 9 %
Lymphocytes Relative: 7 %
Lymphs Abs: 0.4 10*3/uL — ABNORMAL LOW (ref 0.7–4.0)
MCH: 29.5 pg (ref 26.0–34.0)
MCHC: 32.7 g/dL (ref 30.0–36.0)
MCV: 90.2 fL (ref 80.0–100.0)
Monocytes Absolute: 0.9 10*3/uL (ref 0.1–1.0)
Monocytes Relative: 16 %
Neutro Abs: 3.9 10*3/uL (ref 1.7–7.7)
Neutrophils Relative %: 68 %
Platelets: 136 10*3/uL — ABNORMAL LOW (ref 150–400)
RBC: 4.1 MIL/uL (ref 3.87–5.11)
RDW: 19.3 % — ABNORMAL HIGH (ref 11.5–15.5)
WBC: 5.7 10*3/uL (ref 4.0–10.5)
nRBC: 0.5 % — ABNORMAL HIGH (ref 0.0–0.2)

## 2022-07-19 LAB — SURGICAL PATHOLOGY

## 2022-07-19 MED ORDER — POTASSIUM CHLORIDE 10 MEQ/100ML IV SOLN
10.0000 meq | INTRAVENOUS | Status: AC
  Administered 2022-07-19 (×2): 10 meq via INTRAVENOUS
  Filled 2022-07-19 (×2): qty 100

## 2022-07-19 MED ORDER — DILTIAZEM HCL 60 MG PO TABS
90.0000 mg | ORAL_TABLET | Freq: Three times a day (TID) | ORAL | Status: DC
  Administered 2022-07-19 – 2022-07-20 (×2): 90 mg via ORAL
  Filled 2022-07-19 (×3): qty 1

## 2022-07-19 NOTE — Hospital Course (Signed)
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  Patient with poor oral intake due to nausea and emesis.  Patient tried antiemetics and Carafate but unable to keep anything down.  Patient presented to the ED noted to be new onset A-fib and placed on a Cardizem drip.  Hematology/oncology consulted and following, GI consulted.  CT chest was done with findings noted below.  Patient for EGD with possible intervention 07/18/2022.

## 2022-07-19 NOTE — Progress Notes (Signed)
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  Patient with poor oral intake due to nausea and emesis.  Patient tried antiemetics and Carafate but unable to keep anything down.  Patient presented to the ED noted to be new onset A-fib and placed on a Cardizem drip.  Hematology/oncology consulted and following, GI consulted.  CT chest was done with findings noted below.  Patient for EGD with possible intervention 07/18/2022.  Assessment and Plan: #1 dysphagia/odynophagia/nausea and vomiting -Patient presented with several weeks of dysphagia and odynophagia unable to keep anything down with poor oral intake. -Likely secondary to radiation treatments versus a GI etiology. -Patient unable to tolerate viscous lidocaine and has been changed to Vicodin elixir per oncology. 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 -Hematology/oncology feel from their standpoint, patient should be okay for upper endoscopy to rule out stricture. -Continue IV PPI every 12 hours. -CT chest done 07/17/2022 with signs of partial lung resection on the right, density in the right medial chest adjacent to right infrahilar region and postop changes and adjacent to esophagus similar to prior imaging.  Mild circumferential thickening of the esophagus greatest in the mid esophagus likely related to postradiation changes, findings more pronounced than on previous imaging and associated with mild patulous appearance of esophagus  above the mid esophagus which is similar.  New small bilateral pleural effusions. -GI following, pt now s/p EGD on 10/3 with findings of esophageal ulcer with no bleeding and no stigmata of recent bleed. Recs for protonix BID as long as pt is on chemoradiation and to use carafate 1g QID as long as pt is on chemoradiation    2.  New onset atrial fibrillation -Questionable etiology. -2D echo done with EF > 75%, NWMA. Nl LA, no valvular abnormalities.  -Keep potassium approximately 4, magnesium approximately 2. -Due to dysphagia/odynophagia patient had been on a Cardizem drip. -Continue full dose Lovenox for anticoagulation. -Likely transition to oral DOAC. - Candidate for chemical cardioversion. -Will need outpatient follow-up with cardiology. -Transition off cardizem gtt to PO cardizem   3.  GERD -IV PPI.  Once dysphagia improves with improvement with oral intake could transition to oral PPI.     4.  Non-small cell lung cancer -Seen by oncologist, Dr. Marin Olp. -Also seen by primary oncologist, Dr. Curt Bears. -Per oncology.   5.  Pancytopenia -Patient with a improvement with neutropenia with white count at 4.0 today from 3.8 from 1.5 from 1.2 from 0.9, hemoglobin at 12.5 from 12.3 from 7.5 from 7.5, platelet count of 126 from 127 from 116 from 102. -Likely secondary to chemotherapy/radiation treatment. -Patient with no overt bleeding, afebrile. -Chest x-ray with no acute abnormalities. -Patient with no urinary symptoms. -Urinalysis nitrite negative leukocytes negative.  -Patient started on Neupogen/Granix per hematology/oncology. -Transfusion threshold hemoglobin < 7, platelet count < 20000. -Patient is status posttransfusion 2 units packed red blood cells during this hospitalization per hematology. -Monitor on anticoagulation. -Per hematology/oncology.   6.  COPD -Continue Dulera, Incruse.   -Xopenex/Atrovent nebs as needed.  7.  Protein calorie malnutrition -Likely  secondary to poor oral intake due to dysphagia/odynophagia in the setting of non-small cell lung cancer. -Status post IV albumin every 6 hours x24 hours. -Patient placed on nutritional supplementation.   8.  Hyperlipidemia -Continue to hold statin, resume on discharge.       Subjective: Eager to go home soon  Physical Exam: Vitals:   07/19/22 1300 07/19/22 1400 07/19/22 1500 07/19/22 1606  BP:   (!) 125/97   Pulse: (!) 101 99 (!) 110   Resp: 18 (!) 27 (!) 21   Temp:    97.6 F (36.4 C)  TempSrc:    Oral  SpO2: 99% 94% 97%   Weight:      Height:       General exam: Awake, laying in bed, in nad Respiratory system: Normal respiratory effort, no wheezing Cardiovascular system: regular rate, s1, s2 Gastrointestinal system: Soft, nondistended, positive BS Central nervous system: CN2-12 grossly intact, strength intact Extremities: Perfused, no clubbing Skin: Normal skin turgor, no notable skin lesions seen Psychiatry: Mood normal // no visual hallucinations   Data Reviewed:  Labs reviewed: Na 138, K 3.4, Cr 0.99  Family Communication: Pt in room, family at bedside  Disposition: Status is: Inpatient Remains inpatient appropriate because: Severity of illness  Planned Discharge Destination: Home     Author: Marylu Lund, MD 07/19/2022 5:25 PM  For on call review [email or web address].

## 2022-07-19 NOTE — Progress Notes (Addendum)
  [facility] Progress Note  Amy [name] 76 y.o. 08/04/1946   Subjective: Seen and examined sitting in chair.  Notes swallowing is much improved today.  She feels she might be able to tolerate some pills.  She notes good improvement with Carafate.  She would like to make it known she does not want to continue radiation therapy.   Objective: Vital signs in last 24 hours: Vitals:   07/19/22 0716 07/19/22 0800  BP:  (!) 143/81  Pulse:  75  Resp:  18  Temp: (!) 97.5 F (36.4 C)   SpO2:  95%    Physical Exam:  General:  Alert, cooperative, no distress, appears stated age  Head:  Normocephalic, without obvious abnormality, atraumatic  Eyes:  Anicteric sclera, EOM's intact  Lungs:   Clear to auscultation bilaterally, respirations unlabored  Heart:  Regular rate and rhythm, S1, S2 normal  Abdomen:   Soft, non-tender, bowel sounds active all four quadrants,  no masses,   Extremities: Extremities normal, atraumatic, no  edema  Pulses: 2+ and symmetric    Lab Results: Recent Labs    07/17/22 0749 07/18/22 0316 07/19/22 0318  NA 138 139 138  K 3.1* 3.4* 3.4*  CL 100 103 103  CO2 26 24 26   GLUCOSE 94 82 110*  BUN 10 15 20   CREATININE 0.85 0.89 0.99  CALCIUM 9.3 9.4 9.2  MG 1.5* 2.0  --    Recent Labs    07/18/22 0316 07/19/22 0318  AST 15 15  ALT 12 13  ALKPHOS 54 50  BILITOT 2.5* 1.7*  PROT 6.0* 5.9*  ALBUMIN 4.1 3.7   Recent Labs    07/18/22 0316 07/19/22 0318  WBC 4.0 5.7  NEUTROABS 3.2 3.9  HGB 12.5 12.1  HCT 37.2 37.0  MCV 88.6 90.2  PLT 126* 136*   No results for input(s): "LABPROT", "INR" in the last 72 hours.    Assessment Dysphagia Odynophagia Locally recurrent non-small cell lung cancer, history of radiation last on 07/13/2022 A-fib   Patient with acute on chronic dysphagia and and odynophagia, she has recent history of radiation therapy for non-small cell lung cancer.   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  She notes improvement of dysphagia today.  She also has significant pain relief with Carafate.  Plan: 1. Okay for mechanical soft diet if she tolerates it well. Would avoid meat products.  2.  Continue pantoprazole 40 mg IV twice daily, patient may be able to tolerate pills at this time, can consider switching to oral administration.  3.  Continue Carafate suspension 1 g 3 times daily 4. [facility] will sign off.    FINAL MICROSCOPIC DIAGNOSIS:   A. ESOPHAGUS, MID, ULCER, BIOPSY:  - Severe acute nonspecific esophagitis with ulceration - Negative for dysplasia or malignancy   Amy Nigh Scheck PA-C 07/19/2022, 11:27 AM  Contact #  [phone]

## 2022-07-19 NOTE — Telephone Encounter (Signed)
RN called Mr. Musich to see if Mrs. Wasko would be able to continue radiation treatment and in harsh tone stated she will not be continuing and future treatments.  He feels treatments were causing more harm then good.  He said to let everyone know she will not be coming down to treatment today or any other day to cancel.  Message was relayed to [employer], PA-C and radiation team.

## 2022-07-20 ENCOUNTER — Ambulatory Visit: Payer: Medicare HMO

## 2022-07-20 DIAGNOSIS — I4891 Unspecified atrial fibrillation: Secondary | ICD-10-CM | POA: Diagnosis not present

## 2022-07-20 DIAGNOSIS — F419 Anxiety disorder, unspecified: Secondary | ICD-10-CM | POA: Diagnosis not present

## 2022-07-20 DIAGNOSIS — R1319 Other dysphagia: Secondary | ICD-10-CM | POA: Diagnosis not present

## 2022-07-20 DIAGNOSIS — K2101 Gastro-esophageal reflux disease with esophagitis, with bleeding: Secondary | ICD-10-CM | POA: Diagnosis not present

## 2022-07-20 LAB — COMPREHENSIVE METABOLIC PANEL
ALT: 17 U/L (ref 0–44)
AST: 19 U/L (ref 15–41)
Albumin: 3.4 g/dL — ABNORMAL LOW (ref 3.5–5.0)
Alkaline Phosphatase: 50 U/L (ref 38–126)
Anion gap: 10 (ref 5–15)
BUN: 22 mg/dL (ref 8–23)
CO2: 25 mmol/L (ref 22–32)
Calcium: 9.4 mg/dL (ref 8.9–10.3)
Chloride: 105 mmol/L (ref 98–111)
Creatinine, Ser: 0.8 mg/dL (ref 0.44–1.00)
GFR, Estimated: >60 mL/min (ref >60)
Glucose, Bld: 96 mg/dL (ref 70–99)
Potassium: 3.3 mmol/L — ABNORMAL LOW (ref 3.5–5.1)
Sodium: 140 mmol/L (ref 135–145)
Total Bilirubin: 1.2 mg/dL (ref 0.3–1.2)
Total Protein: 5.7 g/dL — ABNORMAL LOW (ref 6.5–8.1)

## 2022-07-20 LAB — URINE CULTURE: Culture: NO GROWTH

## 2022-07-20 LAB — MAGNESIUM: Magnesium: 1.6 mg/dL — ABNORMAL LOW (ref 1.7–2.4)

## 2022-07-20 MED ORDER — POTASSIUM CHLORIDE 20 MEQ PO PACK
60.0000 meq | PACK | ORAL | Status: DC
  Filled 2022-07-20: qty 3

## 2022-07-20 MED ORDER — DILTIAZEM HCL 90 MG PO TABS: 90.0000 mg | ORAL_TABLET | Freq: Three times a day (TID) | ORAL | 0 refills | Status: DC

## 2022-07-20 MED ORDER — MELATONIN 3 MG PO TABS
3.0000 mg | ORAL_TABLET | Freq: Once | ORAL | Status: AC
  Administered 2022-07-20: 3 mg via ORAL
  Filled 2022-07-20: qty 1

## 2022-07-20 MED ORDER — APIXABAN 5 MG PO TABS: 5.0000 mg | ORAL_TABLET | Freq: Two times a day (BID) | ORAL | Status: DC

## 2022-07-20 MED ORDER — APIXABAN 5 MG PO TABS: 5.0000 mg | ORAL_TABLET | Freq: Two times a day (BID) | ORAL | 0 refills | Status: DC

## 2022-07-20 MED ORDER — POTASSIUM CHLORIDE CRYS ER 10 MEQ PO TBCR
60.0000 meq | EXTENDED_RELEASE_TABLET | Freq: Once | ORAL | Status: AC
  Administered 2022-07-20: 60 meq via ORAL
  Filled 2022-07-20: qty 6

## 2022-07-20 MED ORDER — POTASSIUM CHLORIDE CRYS ER 10 MEQ PO TBCR: 10.0000 meq | EXTENDED_RELEASE_TABLET | Freq: Three times a day (TID) | ORAL | 0 refills | Status: DC

## 2022-07-20 MED ORDER — PANTOPRAZOLE SODIUM 40 MG PO TBEC: 40.0000 mg | DELAYED_RELEASE_TABLET | Freq: Two times a day (BID) | ORAL | 0 refills | Status: AC

## 2022-07-20 MED ORDER — POTASSIUM CHLORIDE CRYS ER 20 MEQ PO TBCR
40.0000 meq | EXTENDED_RELEASE_TABLET | ORAL | Status: DC
  Filled 2022-07-20: qty 2

## 2022-07-20 MED ORDER — DILTIAZEM HCL ER 240 MG PO TB24: 240.0000 mg | ORAL_TABLET | Freq: Every day | ORAL | 0 refills | Status: DC

## 2022-07-20 NOTE — Discharge Instructions (Signed)

## 2022-07-20 NOTE — Progress Notes (Signed)
Patient given all discharge instructions and verbalized an understanding of all discharge instructions. Patient able to tolerate a meal, after having trouble holding down pills. Patient is agreeable to discharge and given teaching on how to manage her radiation esophagitis.

## 2022-07-20 NOTE — Discharge Summary (Addendum)
Physician Discharge Summary   Patient: Amy [name] MRN: [id number] DOB: 01/30/1946  Admit date:     07/14/2022  Discharge date: 07/20/22  Discharge Physician: Marylu Lund   PCP: [name], MD   Recommendations at discharge:    Follow up with PCP in 1-2 weeks Follow up with Oncology as scheduled Follow up with Cardiology as scheduled  **07/25/2022** Received info from [facility] regarding a drug-drug interaction between simvasatin and cardizem. Please note that the statin drug was not in use during the hospitalization. Pharmacy recommended decreasing the statin dose to 10mg . Orders given.  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 *  Hospital Course: 76 year old female, history of locally recurrent squamous cell carcinoma of the lung, COPD, hypertension, hyperlipidemia, anxiety presenting to the ED with dysphagia and odynophagia that has been occurring for the last several weeks.  Patient with poor oral intake due to nausea and emesis.  Patient tried antiemetics and Carafate but unable to keep anything down.  Patient presented to the ED noted to be new onset A-fib and placed on a Cardizem drip.  Hematology/oncology consulted and following, GI consulted.  CT chest was done with findings noted below.  Patient for EGD with possible intervention 07/18/2022.  Assessment and Plan: #1 dysphagia/odynophagia/nausea and vomiting -Patient presented with several weeks of dysphagia and odynophagia unable to keep anything down with poor oral intake. -Likely secondary to radiation treatments versus a GI  etiology. -Patient unable to tolerate viscous lidocaine and has been changed to Vicodin elixir per oncology. 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 -Hematology/oncology feel from their standpoint, patient should be okay for upper endoscopy to rule out stricture. -continued on q12h PPI -CT chest done 07/17/2022 with signs of partial lung resection on the right, density in the right medial chest adjacent to right infrahilar region and postop changes and adjacent to esophagus similar to prior imaging.  Mild circumferential thickening of the esophagus greatest in the mid esophagus likely related to postradiation changes, findings more pronounced than on previous imaging and associated with mild patulous appearance of esophagus above the mid esophagus which is similar.  New small bilateral pleural effusions. -GI following, pt now s/p EGD on 10/3 with findings of esophageal ulcer with no bleeding and no stigmata of recent bleed. Recs for protonix BID as long as pt is on chemoradiation and to use carafate 1g QID as long as pt is on chemoradiation -Pt was able to tolerate soft diet and tolerated majority of medications with exception of some difficulty with potassium tabs    2.  New onset atrial fibrillation -Questionable etiology. -2D echo done with EF > 75%, NWMA. Nl LA, no valvular abnormalities.  -Keep potassium approximately 4, magnesium approximately 2. -Due to dysphagia/odynophagia patient had been on a Cardizem drip. -Had been on lovenox, transitioned to eliquis on d/c - Candidate for chemical cardioversion. -Will need outpatient follow-up with cardiology. -Transition off cardizem gtt to PO cardizem   3.  GERD -  transitioned to PO PPI with continued carafate     4.  Non-small cell lung cancer -Seen by oncologist, Dr. Marin Olp. -Also  seen by primary oncologist, Dr. Curt Bears. -Per oncology.   5.  Pancytopenia -Patient with a improvement with neutropenia with white count at 4.0 today from 3.8 from 1.5 from 1.2 from 0.9, hemoglobin at 12.5 from 12.3 from 7.5 from 7.5, platelet count of 126 from 127 from 116 from 102. -Likely secondary to chemotherapy/radiation treatment. -Patient with no overt bleeding, afebrile. -Chest x-ray with no acute abnormalities. -Patient with no urinary symptoms. -Urinalysis nitrite negative leukocytes negative.  -Patient started on Neupogen/Granix per hematology/oncology. -Transfusion threshold hemoglobin < 7, platelet count < 20000. -Patient is status posttransfusion 2 units packed red blood cells during this hospitalization per hematology. -Monitor on anticoagulation. -Per hematology/oncology.   6.  COPD -Continue Dulera, Incruse.   -Xopenex/Atrovent nebs as needed.   7.  Protein calorie malnutrition -Likely secondary to poor oral intake due to dysphagia/odynophagia in the setting of non-small cell lung cancer. -Status post IV albumin every 6 hours x24 hours. -Patient placed on nutritional supplementation.   8.  Hyperlipidemia -Continue to hold statin, resume on discharge.         Consultants: GI, Cardiology Procedures performed: EGD  Disposition: Home Diet recommendation:  Soft diet DISCHARGE MEDICATION: Allergies as of 07/20/2022   No Known Allergies      Medication List     STOP taking these medications    omeprazole 40 MG capsule Commonly known as: PRILOSEC       TAKE these medications    acetaminophen 500 MG tablet Commonly known as: TYLENOL Take 500-1,000 mg by mouth every 6 (six) hours as needed for mild pain or headache.   Advair Diskus 250-50 MCG/ACT Aepb Generic drug: fluticasone-salmeterol Inhale 1 puff into the lungs 2 (two) times daily.   albuterol 108 (90 Base) MCG/ACT inhaler Commonly known as: VENTOLIN HFA Inhale 2 puffs into the  lungs every 6 (six) hours as needed for wheezing or shortness of breath.   diltiazem 90 MG tablet Commonly known as: CARDIZEM Take 1 tablet (90 mg total) by mouth every 8 (eight) hours for 1 day.   diltiazem 240 MG 24 hr tablet Commonly known as: Cardizem LA Take 1 tablet (240 mg total) by mouth daily. Start taking on: July 21, 2022   HYDROcodone bit-homatropine 5-1.5 MG/5ML syrup Commonly known as: Hycodan Take 5 mLs by mouth every 6 (six) hours as needed for cough.   Multivitamin Adult Chew Chew 1 each by mouth daily.   OVER THE COUNTER MEDICATION Take 1 capsule by mouth daily. Linseed oil + Omega 3 combination supplement- Take 1 capsule by mouth once a day   pantoprazole 40 MG tablet Commonly known as: Protonix Take 1 tablet (40 mg total) by mouth 2 (two) times daily.   prochlorperazine 10 MG tablet Commonly known as: COMPAZINE Take 1 tablet (10 mg total) by mouth every 6 (six) hours as needed for nausea or vomiting.   raloxifene 60 MG tablet Commonly known as: EVISTA Take 60 mg by mouth daily.   sertraline 50 MG tablet Commonly known as: ZOLOFT Take 50 mg by mouth daily.   simvastatin 40 MG tablet Commonly known as: ZOCOR Take 40 mg by mouth every evening.   sucralfate 1 g tablet Commonly known as: Carafate Take 1 tablet (1 g total) by mouth 4 (four) times daily -  with meals and at bedtime. 5 min before meals for radiation  induced esophagitis   traZODone 50 MG tablet Commonly known as: DESYREL Take 25 mg by mouth See admin instructions. Take one to two 25 mg half-tablets by mouth at bedtime        Follow-up Information     Nigel Mormon, MD Follow up on 08/21/2022.   Specialties: Cardiology, Radiology Why: 10 AM Contact information: [place] [address] [phone]         [name], MD Follow up in 2 week(s).   Specialty: Family Medicine Why: Hospital follow up Contact information: [place] [address] [phone]                Discharge Exam: Danley Danker Weights   07/14/22 1441 07/14/22 1908 07/18/22 0929  Weight: 71.7 kg 73.3 kg 73.3 kg   General exam: Awake, laying in bed, in nad Respiratory system: Normal respiratory effort, no wheezing Cardiovascular system: regular rate, s1, s2 Gastrointestinal system: Soft, nondistended, positive BS Central nervous system: CN2-12 grossly intact, strength intact Extremities: Perfused, no clubbing Skin: Normal skin turgor, no notable skin lesions seen Psychiatry: Mood normal // no visual hallucinations   Condition at discharge: fair  The results of significant diagnostics from this hospitalization (including imaging, microbiology, ancillary and laboratory) are listed below for reference.   Imaging Studies: DG CHEST PORT 1 VIEW  Result Date: 07/18/2022 CLINICAL DATA:  Fevers EXAM: PORTABLE CHEST 1 VIEW COMPARISON:  07/14/2022, CT from the previous day. FINDINGS: Cardiac shadow is stable. Aortic calcifications are noted. Postsurgical changes in the right base are again seen and stable. No focal infiltrate or effusion is seen. No bony abnormality is noted. IMPRESSION: No acute abnormality noted. Electronically Signed   By: Inez Catalina M.D.   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 * Tracking Code: BO * EXAM: CT CHEST WITH CONTRAST TECHNIQUE: Multidetector CT imaging of the chest was performed during intravenous contrast administration. RADIATION DOSE REDUCTION: This exam was performed according to the departmental dose-optimization program which includes automated exposure control, adjustment of the mA and/or kV according to patient size and/or use of iterative reconstruction technique. CONTRAST:  186mL OMNIPAQUE IOHEXOL 300 MG/ML   SOLN COMPARISON:  April 17, 2022 FINDINGS: Cardiovascular: Heart size normal. Three-vessel coronary artery calcifications. Aortic caliber normal with atherosclerotic changes. Central pulmonary vasculature mildly engorged otherwise unremarkable on venous phase. Mediastinum/Nodes: Mild circumferential thickening of the esophagus. Mildly patulous appearance of the upper esophagus with small amount of fluid. No signs of adenopathy in the mediastinum. AP window lymph node (image 56/2) 7 mm previously 10 mm. No thoracic inlet or axillary lymphadenopathy. Lungs/Pleura: Signs of partial lung resection in the RIGHT chest. Density about RIGHT lower lobe bronchi in the medial RIGHT chest is similar to previous imaging measuring 2.9 by 1.9 cm as compared to 3.3 x 1.9 cm. This is immediately adjacent to the esophagus in the medial RIGHT lung along areas of prior partial lung resection. There are small bilateral pleural effusions which are new compared to imaging from July. There is no new pulmonary nodule. Biapical pleural and parenchymal scarring slightly asymmetric favoring LEFT upper lobe similar to previous imaging. Pulmonary emphysema as before Upper Abdomen: Incidental imaging of upper abdominal contents without acute process related to liver, pancreas, spleen and adrenal glands as well as  upper portions of RIGHT and LEFT kidney to the extent evaluated. Small hiatal hernia. Musculoskeletal: Spinal degenerative changes without acute or destructive bone process. IMPRESSION: 1. Signs of partial lung resection in the RIGHT chest. 2. Density in the medial RIGHT chest adjacent to RIGHT infrahilar region and postoperative changes and adjacent to the esophagus is similar to prior imaging. Likely related to postoperative and post treatment changes 3. Mild circumferential thickening of the esophagus greatest in the mid esophagus likely related to post radiation changes given provided history and above findings. Findings are more  pronounced than on previous imaging and associated with mildly patulous appearance of the esophagus above the mid esophagus which is similar. 4. New small bilateral pleural effusions. 5. Aortic atherosclerosis. Electronically Signed   By: Zetta Bills M.D.   On: 07/17/2022 15:32   ECHOCARDIOGRAM COMPLETE  Result Date: 07/15/2022    ECHOCARDIOGRAM REPORT   Patient Name:   MYANNA ZIESMER Date of Exam: 07/15/2022 Medical Rec #:  [id number]        Height:       63.0 in Accession #:    [id number]       Weight:       161.6 lb Date of Birth:  14-Apr-1946        BSA:          1.766 m Patient Age:    49 years         BP:           113/47 mmHg Patient Gender: F                HR:           96 bpm. Exam Location:  Inpatient Procedure: 2D Echo, Cardiac Doppler and Color Doppler Indications:    Atrial fibrillation  History:        Patient has no prior history of Echocardiogram examinations.                 COPD; Risk Factors:Hypertension and Dyslipidemia.  Sonographer:    Jefferey Pica Referring Phys: [id number] Jonnie Finner  Sonographer Comments: Image acquisition challenging due to respiratory motion. IMPRESSIONS  1. Left ventricular ejection fraction, by estimation, is >75%. The left ventricle has hyperdynamic function. The left ventricle has no regional wall motion abnormalities. Left ventricular diastolic function could not be evaluated.  2. Right ventricular systolic function was not well visualized. The right ventricular size is not well visualized. Tricuspid regurgitation signal is inadequate for assessing PA pressure.  3. The mitral valve is grossly normal. No evidence of mitral valve regurgitation.  4. The aortic valve is tricuspid. Aortic valve regurgitation is not visualized. Aortic valve sclerosis is present, with no evidence of aortic valve stenosis.  5. The inferior vena cava is normal in size with greater than 50% respiratory variability, suggesting right atrial pressure of 3 mmHg. Comparison(s): No prior  Echocardiogram. FINDINGS  Left Ventricle: Left ventricular ejection fraction, by estimation, is >75%. The left ventricle has hyperdynamic function. The left ventricle has no regional wall motion abnormalities. The left ventricular internal cavity size was normal in size. There is no left ventricular hypertrophy. Left ventricular diastolic function could not be evaluated due to atrial fibrillation. Left ventricular diastolic function could not be evaluated. Right Ventricle: The right ventricular size is not well visualized. Right vetricular wall thickness was not well visualized. Right ventricular systolic function was not well visualized. Tricuspid regurgitation signal is inadequate for assessing PA pressure. Left Atrium: Left atrial  size was normal in size. Right Atrium: Right atrial size was normal in size. Pericardium: There is no evidence of pericardial effusion. Mitral Valve: The mitral valve is grossly normal. No evidence of mitral valve regurgitation. Tricuspid Valve: The tricuspid valve is grossly normal. Tricuspid valve regurgitation is trivial. Aortic Valve: The aortic valve is tricuspid. Aortic valve regurgitation is not visualized. Aortic valve sclerosis is present, with no evidence of aortic valve stenosis. Aortic valve peak gradient measures 11.5 mmHg. Pulmonic Valve: The pulmonic valve was normal in structure. Pulmonic valve regurgitation is not visualized. Aorta: The aortic root and ascending aorta are structurally normal, with no evidence of dilitation. Venous: The inferior vena cava is normal in size with greater than 50% respiratory variability, suggesting right atrial pressure of 3 mmHg. IAS/Shunts: No atrial level shunt detected by color flow Doppler.  LEFT VENTRICLE PLAX 2D LVIDd:         4.10 cm LVIDs:         1.40 cm LV PW:         0.90 cm LV IVS:        1.00 cm LVOT diam:     2.00 cm LV SV:         76 LV SV Index:   43 LVOT Area:     3.14 cm  IVC IVC diam: 1.30 cm LEFT ATRIUM              Index        RIGHT ATRIUM           Index LA diam:        3.70 cm 2.09 cm/m   RA Area:     12.30 cm LA Vol (A2C):   23.6 ml 13.36 ml/m  RA Volume:   29.60 ml  16.76 ml/m LA Vol (A4C):   46.3 ml 26.22 ml/m LA Biplane Vol: 35.0 ml 19.82 ml/m  AORTIC VALVE                 PULMONIC VALVE AV Area (Vmax): 2.61 cm     PV Vmax:       1.18 m/s AV Vmax:        169.75 cm/s  PV Peak grad:  5.6 mmHg AV Peak Grad:   11.5 mmHg LVOT Vmax:      141.00 cm/s LVOT Vmean:     89.650 cm/s LVOT VTI:       0.242 m  AORTA Ao Root diam: 3.10 cm Ao Asc diam:  2.90 cm  SHUNTS Systemic VTI:  0.24 m Systemic Diam: 2.00 cm Lyman Bishop MD Electronically signed by Lyman Bishop MD Signature Date/Time: 07/15/2022/12:18:29 PM    Final    DG Chest Port 1 View  Result Date: 07/14/2022 CLINICAL DATA:  Nausea, vomiting, shortness of breath, weakness EXAM: PORTABLE CHEST 1 VIEW COMPARISON:  Portable exam 1418 hours compared to 05/22/2022 FINDINGS: Normal heart size, mediastinal contours, and pulmonary vascularity. Atherosclerotic calcification aorta. Postsurgical changes RIGHT lung base. Lungs otherwise clear. No pulmonary infiltrate, pleural effusion, or pneumothorax. Bones demineralized. IMPRESSION: Postsurgical changes inferior RIGHT hemithorax, stable. No acute intrathoracic abnormalities. Aortic Atherosclerosis (ICD10-I70.0). Electronically Signed   By: Lavonia Dana M.D.   On: 07/14/2022 14:25    Microbiology: Results for orders placed or performed during the hospital encounter of 07/14/22  MRSA Next Gen by PCR, Nasal     Status: None   Collection Time: 07/14/22 10:38 PM   Specimen: Nasal Mucosa; Nasal Swab  Result Value Ref Range Status  MRSA by PCR Next Gen NOT DETECTED NOT DETECTED Final    Comment: (NOTE) The GeneXpert MRSA Assay (FDA approved for NASAL specimens only), is one component of a comprehensive MRSA colonization surveillance program. It is not intended to diagnose MRSA infection nor to guide or monitor  treatment for MRSA infections. Test performance is not FDA approved in patients less than 29 years old. Performed at [facility], [place] [address], [place], [place] [address]   Urine Culture     Status: Abnormal   Collection Time: 07/16/22  5:40 PM   Specimen: Urine, Clean Catch  Result Value Ref Range Status   Specimen Description   Final    URINE, CLEAN CATCH Performed at [facility], [place] [address] [place], [place] [address]    Special Requests   Final    Immunocompromised Performed at [facility], [place] [address] [place], [place] [address]    Culture (A)  Final    <10,000 COLONIES/mL INSIGNIFICANT GROWTH Performed at [facility] [address] [place], [place] [address]    Report Status 07/18/2022 FINAL  Final  Urine Culture     Status: None   Collection Time: 07/18/22  9:43 PM   Specimen: Urine, Catheterized  Result Value Ref Range Status   Specimen Description   Final    URINE, CATHETERIZED Performed at [facility] [address], [place], [place] [address]    Special Requests   Final    NONE Performed at [facility], [place] [address] [place], [place] [address]    Culture   Final    NO GROWTH Performed at [facility], [place] [address], [place], [place] [address]    Report Status 07/20/2022 FINAL  Final  Culture, blood (Routine X 2) w Reflex to ID Panel     Status: None (Preliminary result)   Collection Time: 07/18/22 10:17 PM   Specimen: BLOOD  Result Value Ref Range Status   Specimen Description   Final    BLOOD RIGHT ANTECUBITAL Performed at [facility] [address], [place], [place] [address]    Special Requests   Final    BOTTLES DRAWN AEROBIC AND ANAEROBIC Blood Culture adequate volume Performed at [facility] [address], [place], [place] [address]    Culture   Final    NO GROWTH 1 DAY Performed  at [facility], [place] [address], [place], [place] [address]    Report Status PENDING  Incomplete  Culture, blood (Routine X 2) w Reflex to ID Panel     Status: None (Preliminary result)   Collection Time: 07/18/22 10:18 PM   Specimen: BLOOD  Result Value Ref Range Status   Specimen Description   Final    BLOOD BLOOD RIGHT HAND Performed at [facility] [address] [place], [place] [address]    Special Requests   Final    BOTTLES DRAWN AEROBIC AND ANAEROBIC Blood Culture adequate volume Performed at [facility] [address] [place], [place] [address]    Culture   Final    NO GROWTH 1 DAY Performed at [facility], [place] [address] [place], [place] [address]    Report Status PENDING  Incomplete    Labs: CBC: Recent Labs  Lab 07/15/22 0315 07/16/22 0341 07/16/22 1452 07/17/22 0749 07/18/22 0316 07/19/22 0318  WBC 0.9* 1.2* 1.5* 3.8* 4.0 5.7  NEUTROABS 0.6* 1.0*  --  3.0 3.2 3.9  HGB 8.3* 7.5* 7.5* 12.3 12.5 12.1  HCT 25.6* 23.3* 22.7* 36.4 37.2 37.0  MCV 91.4 92.1 90.8 87.7 88.6 90.2  PLT 118* 102* 116* 127* 126* 675*   Basic Metabolic Panel: Recent Labs  Lab 07/15/22 0812 07/16/22 0341 07/17/22 0749 07/18/22 0316 07/19/22 0318 07/20/22 0311  NA  --  139 138 139 138 140  K  --  3.3* 3.1* 3.4* 3.4* 3.3*  CL  --  111 100 103 103 105  CO2  --  22 26 24 26 25   GLUCOSE  --  80 94 82 110* 96  BUN  --  14 10 15 20 22   CREATININE  --  0.78 0.85 0.89 0.99 0.80  CALCIUM  --  8.3* 9.3 9.4 9.2 9.4  MG 1.7 1.8 1.5* 2.0  --  1.6*   Liver Function Tests: Recent Labs  Lab 07/16/22 0341 07/17/22 0749 07/18/22 0316 07/19/22 0318 07/20/22 0311  AST 13* 15 15 15 19   ALT 13 13 12 13 17   ALKPHOS 39 50 54 50 50  BILITOT 1.5* 3.2* 2.5* 1.7* 1.2  PROT 5.3* 6.7 6.0* 5.9* 5.7*  ALBUMIN 3.5 4.5 4.1 3.7 3.4*   CBG: Recent Labs  Lab 07/14/22 1936  GLUCAP 90    Discharge time spent: less than 30  minutes.  Signed: Marylu Lund, MD [facility] 07/20/2022

## 2022-07-20 NOTE — Progress Notes (Signed)
ANTICOAGULATION CONSULT NOTE - Initial Consult  Pharmacy Consult for Eliquis Indication: atrial fibrillation  No Known Allergies  Patient Measurements: Height: 5\' 3"  (160 cm) Weight: 73.3 kg (161 lb 9.6 oz) IBW/kg (Calculated) : 52.4  Vital Signs: Temp: 97.3 F (36.3 C) (10/05 0800) Temp Source: Oral (10/05 0800) BP: 147/60 (10/05 0800) Pulse Rate: 82 (10/05 0800)  Labs: Recent Labs    07/18/22 0316 07/19/22 0318 07/20/22 0311  HGB 12.5 12.1  --   HCT 37.2 37.0  --   PLT 126* 136*  --   CREATININE 0.89 0.99 0.80    Estimated Creatinine Clearance: 55.6 mL/min (by C-G formula based on SCr of 0.8 mg/dL).   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  Status post right lower lobe superior segmentectomy    Medications:  Scheduled:   sodium chloride  250 mL Intravenous Once   apixaban  5 mg Oral BID   Chlorhexidine Gluconate Cloth  6 each Topical Daily   diltiazem  90 mg Oral Q8H   diphenhydrAMINE  6.5 mg Intravenous Once   feeding supplement  1 Container Oral TID BM   mometasone-formoterol  2 puff Inhalation BID   mouth rinse  15 mL Mouth Rinse 4 times per day   pantoprazole (PROTONIX) IV  40 mg Intravenous Q12H   potassium chloride  60 mEq Oral Q4H   sucralfate  1 g Oral TID WC & HS   umeclidinium bromide  1 puff Inhalation Daily   Infusions:   ceFEPime (MAXIPIME) IV Stopped (07/20/22 0133)   diltiazem (CARDIZEM) infusion Stopped (07/19/22 2100)   PRN: HYDROcodone-acetaminophen, mouth rinse, prochlorperazine, sodium chloride flush, sodium chloride flush  Assessment: 76 yo female with new onset afib receiving full dose lovenox.   Pharmacy now consulted to transition to Eliquis.  Age, weight and SCr appropriate for standard dose.  Goal of Therapy:  Therapeutic anticoagulation   Plan:  D/c lovenox Begin eliquis 5mg  PO bid Provide 30 day free coupon voucher prior to discharge  Peggyann Juba, PharmD, [place]: [phone] 07/20/2022,11:28 AM

## 2022-07-21 ENCOUNTER — Telehealth: Payer: Self-pay

## 2022-07-21 ENCOUNTER — Ambulatory Visit: Payer: Medicare HMO

## 2022-07-21 ENCOUNTER — Other Ambulatory Visit: Payer: Self-pay | Admitting: Urology

## 2022-07-21 MED ORDER — SUCRALFATE 1 GM/10ML PO SUSP: 1.0000 g | Freq: Three times a day (TID) | ORAL | 0 refills | Status: DC

## 2022-07-21 NOTE — Telephone Encounter (Signed)
RN called patient spoke with husband after confirming identity inform him that medication request for liquid Carafate was sent to their pharmacy of choice.  He was appreciative and thankful for the call.

## 2022-07-23 ENCOUNTER — Encounter (HOSPITAL_COMMUNITY): Payer: Self-pay | Admitting: Gastroenterology

## 2022-07-24 ENCOUNTER — Ambulatory Visit: Payer: Medicare HMO

## 2022-07-24 LAB — CULTURE, BLOOD (ROUTINE X 2)
Culture: NO GROWTH
Culture: NO GROWTH
Special Requests: ADEQUATE
Special Requests: ADEQUATE

## 2022-07-25 ENCOUNTER — Ambulatory Visit: Payer: Medicare HMO

## 2022-07-26 ENCOUNTER — Ambulatory Visit (HOSPITAL_COMMUNITY)
Admission: RE | Admit: 2022-07-26 | Discharge: 2022-07-26 | Disposition: A | Discharge status: Home / Self Care | Payer: Medicare HMO | Source: Ambulatory Visit | Attending: Physician Assistant | Admitting: Physician Assistant

## 2022-07-26 ENCOUNTER — Inpatient Hospital Stay (HOSPITAL_COMMUNITY)
Admission: RE | Admit: 2022-07-26 | Discharge: 2022-07-26 | Disposition: A | Discharge status: Home / Self Care | Payer: Medicare HMO | Source: Ambulatory Visit | Attending: Nurse Practitioner | Admitting: Nurse Practitioner

## 2022-07-26 VITALS — BP 130/64 | HR 125 | Ht 63.0 in | Wt 156.8 lb

## 2022-07-26 DIAGNOSIS — F419 Anxiety disorder, unspecified: Secondary | ICD-10-CM | POA: Diagnosis not present

## 2022-07-26 DIAGNOSIS — D6869 Other thrombophilia: Secondary | ICD-10-CM

## 2022-07-26 DIAGNOSIS — E860 Dehydration: Secondary | ICD-10-CM | POA: Insufficient documentation

## 2022-07-26 DIAGNOSIS — J449 Chronic obstructive pulmonary disease, unspecified: Secondary | ICD-10-CM | POA: Diagnosis not present

## 2022-07-26 DIAGNOSIS — Z7901 Long term (current) use of anticoagulants: Secondary | ICD-10-CM | POA: Diagnosis not present

## 2022-07-26 DIAGNOSIS — D0220 Carcinoma in situ of unspecified bronchus and lung: Secondary | ICD-10-CM | POA: Insufficient documentation

## 2022-07-26 DIAGNOSIS — E785 Hyperlipidemia, unspecified: Secondary | ICD-10-CM | POA: Insufficient documentation

## 2022-07-26 DIAGNOSIS — I4891 Unspecified atrial fibrillation: Secondary | ICD-10-CM | POA: Insufficient documentation

## 2022-07-26 DIAGNOSIS — I10 Essential (primary) hypertension: Secondary | ICD-10-CM | POA: Insufficient documentation

## 2022-07-26 DIAGNOSIS — R69 Illness, unspecified: Secondary | ICD-10-CM | POA: Diagnosis not present

## 2022-07-27 NOTE — Progress Notes (Signed)
Primary Care Physician: [name], MD Referring Physician: [facility] f/u    Amy [name] is a 76 y.o. 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  Patient with poor oral intake due to nausea and emesis.  Patient tried antiemetics and Carafate but unable to keep anything down.  Patient presented to the ED noted to be new onset A-fib and placed on a Cardizem drip.  Hematology/oncology consulted and following, GI consulted.  CT chest was done with findings noted below.  Patient for EGD with possible intervention 07/18/2022.  Ptis now in the afib clinic for f/u. She appears to be in a sinus tach at 120 bpm today and reports still with poor oral intake . Vomiting still on a daily to several times a week basis. Many foods/liquids burn her esophagus on swallowing. She is unaware of her heart rhythm. She is on eliquis 5 mg bid with diltiazem 240 mg daily.    Today, she denies symptoms of palpitations, chest pain, shortness of breath, orthopnea, PND, lower extremity edema, dizziness, presyncope, syncope, or neurologic sequela. The patient is tolerating medications without difficulties and is otherwise without complaint today.   Past Medical History:  Diagnosis Date   Anxiety    Arthritis    Chronic kidney disease    COPD (chronic obstructive pulmonary disease) ([place]) 2021   Depression 01/28/2020   Dyspnea    History of hiatal hernia    Hyperlipidemia    Hypertension    Pneumonia    Squamous cell carcinoma of bronchus in right lower lobe ([place]) 04/06/2020   T1, N0, stage Ia.  Status post right lower lobe superior segmentectomy   Past Surgical History:  Procedure Laterality Date   APPENDECTOMY     BALLOON DILATION N/A 07/18/2022   Procedure: BALLOON DILATION;  Surgeon: Ronnette Juniper, MD;  Location: [facility];   Service: Gastroenterology;  Laterality: N/A;   BIOPSY  07/18/2022   Procedure: BIOPSY;  Surgeon: Ronnette Juniper, MD;  Location: [facility];  Service: Gastroenterology;;   ESOPHAGOGASTRODUODENOSCOPY N/A 07/18/2022   Procedure: ESOPHAGOGASTRODUODENOSCOPY (EGD);  Surgeon: Ronnette Juniper, MD;  Location: [facility];  Service: Gastroenterology;  Laterality: N/A;   EYE SURGERY     INTERCOSTAL NERVE BLOCK Right 03/17/2020   Procedure: Intercostal Nerve Block;  Surgeon: [name], MD;  Location: [facility];  Service: Thoracic;  Laterality: Right;   LUNG SURGERY Right 03/17/2020   NODE DISSECTION Right 03/17/2020   Procedure: Node Dissection;  Surgeon: [name], MD;  Location: [facility];  Service: Thoracic;  Laterality: Right;   TUBAL LIGATION     VIDEO BRONCHOSCOPY WITH ENDOBRONCHIAL ULTRASOUND N/A 05/22/2022   Procedure: VIDEO BRONCHOSCOPY WITH ENDOBRONCHIAL ULTRASOUND;  Surgeon: [name], MD;  Location: [facility];  Service: Thoracic;  Laterality: N/A;    Current Outpatient Medications  Medication Sig Dispense Refill   acetaminophen (TYLENOL) 500 MG tablet Take 500-1,000 mg by mouth every 6 (six) hours as needed for mild pain or headache.     ADVAIR DISKUS 250-50 MCG/ACT AEPB Inhale 1 puff into the lungs 2 (two) times daily.     albuterol (VENTOLIN HFA) 108 (90 Base) MCG/ACT inhaler Inhale 2 puffs into the lungs every 6 (six) hours as needed for wheezing or shortness of breath.     apixaban (ELIQUIS) 5 MG TABS tablet Take 1 tablet (  5 mg total) by mouth 2 (two) times daily. 60 tablet 0   colchicine 0.6 MG tablet Take 0.6 mg by mouth 2 (two) times daily.     diltiazem (CARDIZEM LA) 240 MG 24 hr tablet Take 1 tablet (240 mg total) by mouth daily. 30 tablet 0   HYDROcodone bit-homatropine (HYCODAN) 5-1.5 MG/5ML syrup Take 5 mLs by mouth every 6 (six) hours as needed for cough. 120 mL 0   Multiple Vitamins-Minerals (MULTIVITAMIN ADULT) CHEW Chew 1 each by mouth daily.     OVER THE  COUNTER MEDICATION Take 1 capsule by mouth daily. Linseed oil + Omega 3 combination supplement- Take 1 capsule by mouth once a day     pantoprazole (PROTONIX) 40 MG tablet Take 1 tablet (40 mg total) by mouth 2 (two) times daily. 60 tablet 0   prochlorperazine (COMPAZINE) 10 MG tablet Take 1 tablet (10 mg total) by mouth every 6 (six) hours as needed for nausea or vomiting. 30 tablet 0   sertraline (ZOLOFT) 50 MG tablet Take 50 mg by mouth daily.     simvastatin (ZOCOR) 10 MG tablet Take 10 mg by mouth at bedtime.     sucralfate (CARAFATE) 1 GM/10ML suspension Take 10 mLs (1 g total) by mouth 4 (four) times daily -  with meals and at bedtime. 420 mL 0   raloxifene (EVISTA) 60 MG tablet Take 60 mg by mouth daily. (Patient not taking: Reported on 07/26/2022)     traZODone (DESYREL) 50 MG tablet Take 25 mg by mouth See admin instructions. Take one to two 25 mg half-tablets by mouth at bedtime (Patient not taking: Reported on 07/26/2022)     No current facility-administered medications for this encounter.    No Known Allergies  Social History   Socioeconomic History   Marital status: Married    Spouse name: Not on file   Number of children: Not on file   Years of education: Not on file   Highest education level: Not on file  Occupational History   Not on file  Tobacco Use   Smoking status: Former    Packs/day: 1.00    Years: 55.00    Total pack years: 55.00    Types: Cigarettes    Quit date: 03/25/2020    Years since quitting: 2.3   Smokeless tobacco: Never  Vaping Use   Vaping Use: Not on file  Substance and Sexual Activity   Alcohol use: Yes    Comment: Social with wine   Drug use: Never   Sexual activity: Not on file  Other Topics Concern   Not on file  Social History Narrative   Not on file   Social Determinants of Health   Financial Resource Strain: Not on file  Food Insecurity: No Food Insecurity (07/14/2022)   Hunger Vital Sign    Worried About Running Out of Food  in the Last Year: Never true    Ran Out of Food in the Last Year: Never true  Transportation Needs: No Transportation Needs (07/14/2022)   PRAPARE - Hydrologist (Medical): No    Lack of Transportation (Non-Medical): No  Physical Activity: Not on file  Stress: Not on file  Social Connections: Not on file  Intimate Partner Violence: Not At Risk (07/14/2022)   Humiliation, Afraid, Rape, and Kick questionnaire    Fear of Current or Ex-Partner: No    Emotionally Abused: No    Physically Abused: No    Sexually Abused: No  No family history on file.  ROS- All systems are reviewed and negative except as per the HPI above  Physical Exam: Vitals:   07/26/22 1338  BP: 130/64  Pulse: (!) 125  Weight: 71.1 kg  Height: 5\' 3"  (1.6 m)   Wt Readings from Last 3 Encounters:  07/26/22 71.1 kg  07/18/22 73.3 kg  07/14/22 72 kg    Labs: Lab Results  Component Value Date   NA 140 07/20/2022   K 3.3 (L) 07/20/2022   CL 105 07/20/2022   CO2 25 07/20/2022   GLUCOSE 96 07/20/2022   BUN 22 07/20/2022   CREATININE 0.80 07/20/2022   CALCIUM 9.4 07/20/2022   MG 1.6 (L) 07/20/2022   Lab Results  Component Value Date   INR 1.0 05/22/2022   No results found for: "CHOL", "HDL", "LDLCALC", "TRIG"   GEN- The patient is well appearing, alert and oriented x 3 today.   Head- normocephalic, atraumatic Eyes-  Sclera clear, conjunctiva pink Ears- hearing intact Oropharynx- clear Neck- supple, no JVP Lymph- no cervical lymphadenopathy Lungs- Clear to ausculation bilaterally, normal work of breathing Heart-Fast Regular rate and rhythm, no murmurs, rubs or gallops, PMI not laterally displaced GI- soft, NT, ND, + BS Extremities- no clubbing, cyanosis, or edema MS- no significant deformity or atrophy Skin- no rash or lesion Psych- euthymic mood, full affect Neuro- strength and sensation are intact  EKG-sinus tachycardia at 125 bpm. Pr int 160 ms, qrs int 80 ms,  qtc 435 ms   Assessment and Plan:   1. Afib  New onset in the ED associated with poor oral intake and dehydration  Now in Sinus tach still think higher heart rate is associated with poor oral intake/dehydration/ cancer treatment  She will continue diltiazem 240 mg daily  Continue eliquis 5 mg bid for a CHA2DS2VASc score of at least 3 I will place a 2 week zio patch to further understand arrythmia  I will see back in 3 weeks  Butch Penny C. Ayame Rena, [facility] [address] [place], [place] [id number] [phone]

## 2022-07-31 DIAGNOSIS — Z09 Encounter for follow-up examination after completed treatment for conditions other than malignant neoplasm: Secondary | ICD-10-CM | POA: Diagnosis not present

## 2022-07-31 DIAGNOSIS — R1310 Dysphagia, unspecified: Secondary | ICD-10-CM | POA: Diagnosis not present

## 2022-07-31 DIAGNOSIS — D6869 Other thrombophilia: Secondary | ICD-10-CM | POA: Diagnosis not present

## 2022-07-31 DIAGNOSIS — I480 Paroxysmal atrial fibrillation: Secondary | ICD-10-CM | POA: Diagnosis not present

## 2022-07-31 DIAGNOSIS — I700 Atherosclerosis of aorta: Secondary | ICD-10-CM | POA: Diagnosis not present

## 2022-08-09 ENCOUNTER — Ambulatory Visit (HOSPITAL_COMMUNITY)
Admission: RE | Admit: 2022-08-09 | Discharge: 2022-08-09 | Disposition: A | Discharge status: Home / Self Care | Payer: Medicare HMO | Source: Ambulatory Visit | Attending: Internal Medicine | Admitting: Internal Medicine

## 2022-08-09 DIAGNOSIS — C3490 Malignant neoplasm of unspecified part of unspecified bronchus or lung: Secondary | ICD-10-CM | POA: Diagnosis not present

## 2022-08-09 DIAGNOSIS — J984 Other disorders of lung: Secondary | ICD-10-CM | POA: Diagnosis not present

## 2022-08-09 MED ORDER — IOHEXOL 300 MG/ML  SOLN
75.0000 mL | Freq: Once | INTRAMUSCULAR | Status: AC | PRN
  Administered 2022-08-09: 75 mL via INTRAVENOUS

## 2022-08-13 NOTE — Progress Notes (Unsigned)
[facility] OFFICE PROGRESS NOTE  [place] [id number]  DIAGNOSIS: Recurrent lung cancer initially diagnosed as stage IA (T1, N0, M0) non-small cell lung cancer, squamous cell carcinoma.  She presented with a right lower lobe lung nodule.  She was diagnosed in June 2021.   PDL1 Expression 1%  PRIOR THERAPY: 1) Right lower lobe superior segmentectomy and lymph node dissection under the care of Dr. Roxan Hockey on 03/17/2020. 2) Concurrent chemoradiation with weekly carboplatin for AUC of 2 and paclitaxel 45 Mg/M2. Last dose on 07/10/22  Status post 6 cycles.  CURRENT THERAPY: Consolidation immunotherapy with Imfinzi 1500 mg IV every 4 weeks.  First dose expected on 08/22/2022.  INTERVAL HISTORY: Amy [name] 76 y.o. female returns to the clinic today for a follow-up visit accompanied by her husband.  In August 2023, the patient was unfortunately found to have evidence of disease recurrence.  Therefore, she started on treatment with concurrent chemoradiation.  Her last dose of treatment was on 07/10/2022.  Unfortunately, the patient had increasing fatigue, weakness, odynophagia, and dysphagia with treatment.  She eventually presented to the emergency room on 9/29 and was found to have new onset atrial fibrillation.  Throughout the course of her hospitalization, she was evaluated by GI due to her dysphagia and odynophagia and had a EGD which showed esophageal ulcer.  She also had pancytopenia.   Since being discharged from the hospital on 07/20/22, she is feeling "better".  She reports her swallowing has improved. She reports worsening belching and burping, which she she had prior to treatment, then stopped and now has started again. She reports vomiting when she left the hospital but that has improved. She reports diarrhea secondary to a gout medication that has cleared. She reports constipation that she controls with medication. Today she  denies any fever, chills, night sweats. She reports 15 lbs of weight loss from the hospitalization. She reports no concerns with her breathing. She reports no worsening of her shortness of breath, but notes is occurs with exertion sometimes. She denies chest pain, cough, hemoptysis. She denies HA or visual changes. She denies rash.   She also reports an incident of bilateral leg swelling after going on a 4 hr trip. She reports the swelling diminished when she raised her feet. She reports taking the blood thinner every day for her newly diagnosed afib.   She recently had a restaging CT scan performed.  She is here today for evaluation and for more detailed discussion about her current condition and recommended treatment options.    MEDICAL HISTORY: Past Medical History:  Diagnosis Date   Anxiety    Arthritis    Chronic kidney disease    COPD (chronic obstructive pulmonary disease) ([place]) 2021   Depression 01/28/2020   Dyspnea    History of hiatal hernia    Hyperlipidemia    Hypertension    Pneumonia    Squamous cell carcinoma of bronchus in right lower lobe ([place]) 04/06/2020   T1, N0, stage Ia.  Status post right lower lobe superior segmentectomy    ALLERGIES:  has No Known Allergies.  MEDICATIONS:  Current Outpatient Medications  Medication Sig Dispense Refill   acetaminophen (TYLENOL) 500 MG tablet Take 500-1,000 mg by mouth every 6 (six) hours as needed for mild pain or headache.     ADVAIR DISKUS 250-50 MCG/ACT AEPB Inhale 1 puff into the lungs 2 (two) times daily.     albuterol (VENTOLIN HFA)  108 (90 Base) MCG/ACT inhaler Inhale 2 puffs into the lungs every 6 (six) hours as needed for wheezing or shortness of breath.     apixaban (ELIQUIS) 5 MG TABS tablet Take 1 tablet (5 mg total) by mouth 2 (two) times daily. 60 tablet 0   colchicine 0.6 MG tablet Take 0.6 mg by mouth 2 (two) times daily.     diltiazem (CARDIZEM LA) 240 MG 24 hr tablet Take 1 tablet (240 mg total) by mouth  daily. 30 tablet 0   HYDROcodone bit-homatropine (HYCODAN) 5-1.5 MG/5ML syrup Take 5 mLs by mouth every 6 (six) hours as needed for cough. 120 mL 0   Multiple Vitamins-Minerals (MULTIVITAMIN ADULT) CHEW Chew 1 each by mouth daily.     OVER THE COUNTER MEDICATION Take 1 capsule by mouth daily. Linseed oil + Omega 3 combination supplement- Take 1 capsule by mouth once a day     pantoprazole (PROTONIX) 40 MG tablet Take 1 tablet (40 mg total) by mouth 2 (two) times daily. 60 tablet 0   prochlorperazine (COMPAZINE) 10 MG tablet Take 1 tablet (10 mg total) by mouth every 6 (six) hours as needed for nausea or vomiting. 30 tablet 0   raloxifene (EVISTA) 60 MG tablet Take 60 mg by mouth daily.     sertraline (ZOLOFT) 50 MG tablet Take 50 mg by mouth daily.     simvastatin (ZOCOR) 10 MG tablet Take 10 mg by mouth at bedtime.     sucralfate (CARAFATE) 1 GM/10ML suspension Take 10 mLs (1 g total) by mouth 4 (four) times daily -  with meals and at bedtime. 420 mL 0   traZODone (DESYREL) 50 MG tablet Take 25 mg by mouth See admin instructions. Take one to two 25 mg half-tablets by mouth at bedtime     No current facility-administered medications for this visit.    SURGICAL HISTORY:  Past Surgical History:  Procedure Laterality Date   APPENDECTOMY     BALLOON DILATION N/A 07/18/2022   Procedure: BALLOON DILATION;  Surgeon: Ronnette Juniper, MD;  Location: [facility];  Service: Gastroenterology;  Laterality: N/A;   BIOPSY  07/18/2022   Procedure: BIOPSY;  Surgeon: Ronnette Juniper, MD;  Location: [facility];  Service: Gastroenterology;;   ESOPHAGOGASTRODUODENOSCOPY N/A 07/18/2022   Procedure: ESOPHAGOGASTRODUODENOSCOPY (EGD);  Surgeon: Ronnette Juniper, MD;  Location: [facility];  Service: Gastroenterology;  Laterality: N/A;   EYE SURGERY     INTERCOSTAL NERVE BLOCK Right 03/17/2020   Procedure: Intercostal Nerve Block;  Surgeon: [name], MD;  Location: [facility];  Service: Thoracic;  Laterality: Right;    LUNG SURGERY Right 03/17/2020   NODE DISSECTION Right 03/17/2020   Procedure: Node Dissection;  Surgeon: [name], MD;  Location: [facility];  Service: Thoracic;  Laterality: Right;   TUBAL LIGATION     VIDEO BRONCHOSCOPY WITH ENDOBRONCHIAL ULTRASOUND N/A 05/22/2022   Procedure: VIDEO BRONCHOSCOPY WITH ENDOBRONCHIAL ULTRASOUND;  Surgeon: [name], MD;  Location: [facility];  Service: Thoracic;  Laterality: N/A;    REVIEW OF SYSTEMS:   Review of Systems  Constitutional: Negative for appetite change, chills, fatigue, fever. pos for weight loss.  HENT:  Positive for improving dysphagia. Negative for mouth sores, nosebleeds, sore throat and trouble swallowing.   Eyes: Negative for eye problems and icterus.  Respiratory: Negative for cough, hemoptysis, shortness of breath and wheezing.   Cardiovascular: Negative for chest pain and leg swelling.  Gastrointestinal: Negative for abdominal pain, diarrhea, nausea and vomiting. Pos for constipation  Genitourinary: Negative for bladder incontinence, difficulty urinating, dysuria, frequency and hematuria.   Musculoskeletal: Negative for back pain, gait problem, neck pain and neck stiffness.  Skin: Negative for itching and rash.  Neurological: Negative for dizziness, extremity weakness, gait problem, headaches, light-headedness and seizures.  Hematological: Negative for adenopathy. Does not bruise/bleed easily.  Psychiatric/Behavioral: Negative for confusion, depression and sleep disturbance. The patient is not nervous/anxious.     PHYSICAL EXAMINATION:  Blood pressure (!) 146/81, pulse 92, temperature 97.7 F (36.5 C), temperature source Oral, resp. rate 16, weight 154 lb 14.4 oz (70.3 kg), SpO2 97 %.  ECOG PERFORMANCE STATUS: 1  Physical Exam  Constitutional: Oriented to person, place, and time and well-developed, well-nourished, and in no distress.  HENT:  Head: Normocephalic and atraumatic.  Mouth/Throat: Oropharynx is clear and  moist. No oropharyngeal exudate.  Eyes: Conjunctivae are normal. Right eye exhibits no discharge. Left eye exhibits no discharge. No scleral icterus.  Neck: Normal range of motion. Neck supple.  Cardiovascular: Normal rate, regular rhythm, normal heart sounds and intact distal pulses.   Pulmonary/Chest: Effort normal and breath sounds normal. No respiratory distress. No wheezes. No rales.  Abdominal: Soft. Bowel sounds are normal. Exhibits no distension and no mass. There is no tenderness.  Musculoskeletal: Normal range of motion. Exhibits no edema.  Lymphadenopathy:    No cervical adenopathy.  Neurological: Alert and oriented to person, place, and time. Exhibits normal muscle tone. Coordination normal. In wheel chair  Skin: Skin is warm and dry. No rash noted. Not diaphoretic. No erythema. No pallor.  Psychiatric: Mood, memory and judgment normal.  Vitals reviewed.  LABORATORY DATA: Lab Results  Component Value Date   WBC 5.8 08/15/2022   HGB 12.3 08/15/2022   HCT 36.0 08/15/2022   MCV 91.6 08/15/2022   PLT 182 08/15/2022      Chemistry      Component Value Date/Time   NA 142 08/15/2022 1107   K 3.6 08/15/2022 1107   CL 105 08/15/2022 1107   CO2 30 08/15/2022 1107   BUN 13 08/15/2022 1107   CREATININE 1.20 (H) 08/15/2022 1107      Component Value Date/Time   CALCIUM 9.1 08/15/2022 1107   ALKPHOS 60 08/15/2022 1107   AST 17 08/15/2022 1107   ALT 13 08/15/2022 1107   BILITOT 0.8 08/15/2022 1107       RADIOGRAPHIC STUDIES:  CT Chest W Contrast  Result Date: 08/11/2022 CLINICAL DATA:  Non-small-cell lung cancer.  Restaging. EXAM: CT CHEST WITH CONTRAST TECHNIQUE: Multidetector CT imaging of the chest was performed during intravenous contrast administration. RADIATION DOSE REDUCTION: This exam was performed according to the departmental dose-optimization program which includes automated exposure control, adjustment of the mA and/or kV according to patient size and/or use  of iterative reconstruction technique. CONTRAST:  47m OMNIPAQUE IOHEXOL 300 MG/ML  SOLN COMPARISON:  07/17/2022 FINDINGS: Cardiovascular: The heart size is normal. No substantial pericardial effusion. Coronary artery calcification is evident. Moderate atherosclerotic calcification is noted in the wall of the thoracic aorta. Mediastinum/Nodes: No mediastinal lymphadenopathy. There is no hilar lymphadenopathy. The esophagus has normal imaging features. Debris in the upper esophagus may be related to dysmotility or reflux. There is no axillary lymphadenopathy. Lungs/Pleura: Biapical pleuroparenchymal scarring again noted, left greater than right. Post surgical scarring in the posterior right lung is stable. Stable scattered bilateral tiny pulmonary nodules. No new suspicious pulmonary nodule or mass. Upper Abdomen: Unremarkable. Musculoskeletal: No worrisome lytic or sclerotic osseous abnormality. IMPRESSION: 1. Stable exam. No new  or progressive findings to suggest recurrent or metastatic disease. 2. Post treatment scarring in the posterior and medial right lung is stable in the interval. 3. Stable scattered bilateral tiny pulmonary nodules. 4. Debris in the upper esophagus may be related to dysmotility or reflux. Circumferential wall thickening noted previously in the esophagus is less prominent today. 5. Tiny bilateral pleural effusions seen previously have resolved on the left and decreased on the right. 6.  Aortic Atherosclerosis (ICD10-I70.0). Electronically Signed   By: Misty Stanley M.D.   On: 08/11/2022 15:01   DG CHEST PORT 1 VIEW  Result Date: 07/18/2022 CLINICAL DATA:  Fevers EXAM: PORTABLE CHEST 1 VIEW COMPARISON:  07/14/2022, CT from the previous day. FINDINGS: Cardiac shadow is stable. Aortic calcifications are noted. Postsurgical changes in the right base are again seen and stable. No focal infiltrate or effusion is seen. No bony abnormality is noted. IMPRESSION: No acute abnormality noted.  Electronically Signed   By: Inez Catalina M.D.   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 * Tracking Code: BO * EXAM: CT CHEST WITH CONTRAST TECHNIQUE: Multidetector CT imaging of the chest was performed during intravenous contrast administration. RADIATION DOSE REDUCTION: This exam was performed according to the departmental dose-optimization program which includes automated exposure control, adjustment of the mA and/or kV according to patient size and/or use of iterative reconstruction technique. CONTRAST:  168m OMNIPAQUE IOHEXOL 300 MG/ML  SOLN COMPARISON:  April 17, 2022 FINDINGS: Cardiovascular: Heart size normal. Three-vessel coronary artery calcifications. Aortic caliber normal with atherosclerotic changes. Central pulmonary vasculature mildly engorged otherwise unremarkable on venous phase. Mediastinum/Nodes: Mild circumferential thickening of the esophagus. Mildly patulous appearance of the upper esophagus with small amount of fluid. No signs of adenopathy in the mediastinum. AP window lymph node (image 56/2) 7 mm previously 10 mm. No thoracic inlet or axillary lymphadenopathy. Lungs/Pleura: Signs of partial lung resection in the RIGHT chest. Density about RIGHT lower lobe bronchi in the medial RIGHT chest is similar to previous imaging measuring 2.9 by 1.9 cm as compared to 3.3 x 1.9 cm. This is immediately adjacent to the esophagus in the medial RIGHT lung along areas of prior partial lung resection. There are small bilateral pleural effusions which are new compared to imaging from July. There is no new pulmonary nodule. Biapical pleural and parenchymal scarring slightly asymmetric favoring LEFT upper lobe similar to previous imaging. Pulmonary emphysema as before Upper Abdomen: Incidental imaging of upper abdominal contents without  acute process related to liver, pancreas, spleen and adrenal glands as well as upper portions of RIGHT and LEFT kidney to the extent evaluated. Small hiatal hernia. Musculoskeletal: Spinal degenerative changes without acute or destructive bone process. IMPRESSION: 1. Signs of partial lung resection in the RIGHT chest. 2. Density in the medial RIGHT chest adjacent to RIGHT infrahilar region and postoperative changes and adjacent to the esophagus is similar to prior imaging. Likely related to postoperative and post treatment changes 3. Mild circumferential thickening of the esophagus greatest in the mid esophagus likely related to post radiation changes given provided history and above findings. Findings are more pronounced than on previous imaging and associated with mildly patulous appearance of the esophagus above the mid esophagus which is similar. 4. New small bilateral pleural effusions. 5. Aortic atherosclerosis. Electronically Signed   By: GJewel BaizeD.  On: 07/17/2022 15:32     ASSESSMENT/PLAN:  This is a very pleasant 76 year old Caucasian female initially diagnosed as a stage Ia (T1 a, N0, M0) non-small cell lung cancer, squamous cell carcinoma.  The patient initially presented with a right lower lobe lung nodule in June 2021.  She is status post right lower lobe superior segmentectomy with lymph node dissection under the care of Dr. Roxan Hockey on 6/2/thousand 21.  The patient had evidence of disease recurrence in July 2023.  She had new right infrahilar soft tissue lesion encasing the bronchus intermedius and disease recurrence cannot be completely excluded.  There is also new mild bilateral lower paratracheal lymphadenopathy that is nonspecific and nodal metastatic disease cannot be excluded.  She had a PET scan that showed intensely hypermetabolic recurrence in the right infrahilar region with mild prominent left paratracheal and paraesophageal lymph nodes also mildly hypermetabolic for  size and suspicious for nodal metastasis but there is no evidence of distant metastatic disease.  The patient had a repeat bronchoscopy and EBUS under the care of Dr. Roxan Hockey and the final pathology was consistent with recurrent squamous cell carcinoma.  She started treatment with concurrent chemoradiation with carboplatin for an AUC of 2 and paclitaxel 45 mg per metered square.  She status post 6 cycles of treatment.  She had a challenging time with treatment due to radiation-induced esophagitis as well as fatigue and weakness.  She was hospitalized after cycle #6 due to new onset atrial fibrillation and pancytopenia.  The patient was also evaluated by GI with an EGD on 10/3 which showed esophageal ulcer.  Since being discharged from the hospital patient is feeling "better" and reports her swallowing has improved but she still sometimes feels a "lump" in her throat.   The patient recently had a restaging CT scan performed.  Dr. Julien Nordmann personally independently reviewed the scan discussed results with the patient today.  The scan showed improvement in her disease when compared to her pretreatment scan.    Dr. Julien Nordmann had a lengthy discussion with the patient today about her current condition and next steps in her care.  Dr. Julien Nordmann recommends that she consider consolidation immunotherapy with Imfinzi 1500 mg IV every 4 weeks.  The patient is interested in this option and she is expected to receive her first dose of treatment on 08/23/2022.  I discussed with her the adverse effect of the immunotherapy including but not limited to immunotherapy mediated skin rash, diarrhea, inflammation of the lung, kidney, liver, thyroid or other endocrine dysfunction  We will see her back for follow-up visit in 5 weeks for evaluation and repeat blood work before starting cycle #2.  The patient was advised to call immediately if she has any concerning symptoms in the interval. The patient voices understanding of  current disease status and treatment options and is in agreement with the current care plan. All questions were answered. The patient knows to call the clinic with any problems, questions or concerns. We can certainly see the patient much sooner if necessary   No orders of the defined types were placed in this encounter.     Clark Cuff L Mihir Flanigan, PA-C 08/15/22  ADDENDUM: Hematology/Oncology Attending: I had a face-to-face encounter with the patient today.  I reviewed her records, lab, scan and recommended her care plan.  This is a very pleasant 76 years old white female with recurrent non-small cell lung cancer presented as a stage III with recurrent disease in the mediastinal lymph nodes.  This was initially  diagnosed as a stage Ia squamous cell carcinoma in June 2021 status post right lower lobe superior segmentectomy with lymph node dissection.  The patient had evidence for disease recurrence in August 2023.  Molecular studies showed no actionable mutations and PD-L1 expression was 1%.  She underwent a course of concurrent chemoradiation with weekly carboplatin for AUC of 2 and paclitaxel 45 Mg/M2.  She tolerated her treatment well except for the fatigue and the radiation-induced odynophagia and dysphagia. She was admitted to the hospital few weeks ago with the same complaint and she was seen by gastroenterology and underwent esophageal dilatation. She is followed by Dr. Therisa Doyne from [facility] gastroenterology. The patient had repeat CT scan of the chest performed recently which was compared to the most recent the scan after her treatment and that showed a stable disease but I personally reviewed this scan in comparison to the initial scan before her treatment and that showed improvement in the mediastinal lymphadenopathy. I discussed the scan results with the patient and her husband. I recommended for her treatment with consolidation immunotherapy with Imfinzi 1500 Mg/M2 every 4 weeks for a  total of 1 year if she has no evidence for disease progression or unacceptable toxicity.  She was also given the option of continuous observation and monitoring.  The patient and her husband are interested in proceeding with the treatment.  I discussed with her the adverse effect of this treatment including but not limited to immunotherapy mediated the skin rash, diarrhea, inflammation of the lung, kidney, liver, thyroid or other endocrine dysfunction. The patient is expected to start the first cycle of this treatment next week. She will come back for follow-up visit in 5 weeks with the start of cycle #2. She was advised to call immediately if she has any other concerning symptoms in the interval. The total time spent in the appointment was 35 minutes. Disclaimer: This note was dictated with voice recognition software. Similar sounding words can inadvertently be transcribed and may be missed upon review. Eilleen Kempf, MD

## 2022-08-15 ENCOUNTER — Inpatient Hospital Stay: Payer: Medicare HMO | Attending: Physician Assistant

## 2022-08-15 ENCOUNTER — Other Ambulatory Visit: Payer: Self-pay

## 2022-08-15 ENCOUNTER — Other Ambulatory Visit: Payer: Self-pay | Admitting: Family Medicine

## 2022-08-15 ENCOUNTER — Inpatient Hospital Stay (HOSPITAL_BASED_OUTPATIENT_CLINIC_OR_DEPARTMENT_OTHER): Payer: Medicare HMO | Admitting: Physician Assistant

## 2022-08-15 VITALS — BP 146/81 | HR 92 | Temp 97.7°F | Resp 16 | Wt 154.9 lb

## 2022-08-15 DIAGNOSIS — Z9221 Personal history of antineoplastic chemotherapy: Secondary | ICD-10-CM | POA: Insufficient documentation

## 2022-08-15 DIAGNOSIS — I4891 Unspecified atrial fibrillation: Secondary | ICD-10-CM | POA: Diagnosis not present

## 2022-08-15 DIAGNOSIS — Z5111 Encounter for antineoplastic chemotherapy: Secondary | ICD-10-CM

## 2022-08-15 DIAGNOSIS — Z7901 Long term (current) use of anticoagulants: Secondary | ICD-10-CM | POA: Insufficient documentation

## 2022-08-15 DIAGNOSIS — Z923 Personal history of irradiation: Secondary | ICD-10-CM | POA: Diagnosis not present

## 2022-08-15 DIAGNOSIS — C3431 Malignant neoplasm of lower lobe, right bronchus or lung: Secondary | ICD-10-CM

## 2022-08-15 DIAGNOSIS — Z1231 Encounter for screening mammogram for malignant neoplasm of breast: Secondary | ICD-10-CM

## 2022-08-15 LAB — CMP (CANCER CENTER ONLY)
ALT: 13 U/L (ref 0–44)
AST: 17 U/L (ref 15–41)
Albumin: 3.7 g/dL (ref 3.5–5.0)
Alkaline Phosphatase: 60 U/L (ref 38–126)
Anion gap: 7 (ref 5–15)
BUN: 13 mg/dL (ref 8–23)
CO2: 30 mmol/L (ref 22–32)
Calcium: 9.1 mg/dL (ref 8.9–10.3)
Chloride: 105 mmol/L (ref 98–111)
Creatinine: 1.2 mg/dL — ABNORMAL HIGH (ref 0.44–1.00)
GFR, Estimated: 46 mL/min — ABNORMAL LOW (ref >60)
Glucose, Bld: 90 mg/dL (ref 70–99)
Potassium: 3.6 mmol/L (ref 3.5–5.1)
Sodium: 142 mmol/L (ref 135–145)
Total Bilirubin: 0.8 mg/dL (ref 0.3–1.2)
Total Protein: 6.1 g/dL — ABNORMAL LOW (ref 6.5–8.1)

## 2022-08-15 LAB — CBC WITH DIFFERENTIAL (CANCER CENTER ONLY)
Abs Immature Granulocytes: 0.02 10*3/uL (ref 0.00–0.07)
Basophils Absolute: 0 10*3/uL (ref 0.0–0.1)
Basophils Relative: 1 %
Eosinophils Absolute: 0.1 10*3/uL (ref 0.0–0.5)
Eosinophils Relative: 2 %
HCT: 36 % (ref 36.0–46.0)
Hemoglobin: 12.3 g/dL (ref 12.0–15.0)
Immature Granulocytes: 0 %
Lymphocytes Relative: 11 %
Lymphs Abs: 0.6 10*3/uL — ABNORMAL LOW (ref 0.7–4.0)
MCH: 31.3 pg (ref 26.0–34.0)
MCHC: 34.2 g/dL (ref 30.0–36.0)
MCV: 91.6 fL (ref 80.0–100.0)
Monocytes Absolute: 0.5 10*3/uL (ref 0.1–1.0)
Monocytes Relative: 9 %
Neutro Abs: 4.5 10*3/uL (ref 1.7–7.7)
Neutrophils Relative %: 77 %
Platelet Count: 182 10*3/uL (ref 150–400)
RBC: 3.93 MIL/uL (ref 3.87–5.11)
RDW: 19.3 % — ABNORMAL HIGH (ref 11.5–15.5)
WBC Count: 5.8 10*3/uL (ref 4.0–10.5)
nRBC: 0 % (ref 0.0–0.2)

## 2022-08-15 NOTE — Patient Instructions (Signed)
-  We covered a lot of important information at your appointment today regarding what the treatment plan is moving forward. Here are the the main points that were discussed at your office visit with [place] today:  -The treatment will consist of a new medication. This is not chemotherapy. This new drug is a type of Immunotherapy called Imfinzi (Durvalumab).  -We are planning on starting your treatment next week on 08/23/22  -Your treatment will be given once every 4 weeks. You will receive this treatment every four weeks for a total of 1 year (13 total treatments) unless you experience unacceptable toxicity or if there is evidence on your routine CT scans that the cancer is growing  -We will get a CT scan after every 3 treatments to check on the progress of treatment  Side Effects:  -The adverse effect of the immunotherapy including but not limited to immunotherapy mediated skin rash, diarrhea, inflammation of the lung, kidney, liver, thyroid or other endocrine dysfunction  Follow up:  -We will see you back for a follow up visit in 5 weeks with cycle #2.

## 2022-08-15 NOTE — Progress Notes (Signed)
DISCONTINUE ON PATHWAY REGIMEN - Non-Small Cell Lung     A cycle is every 7 days, concurrent with RT:     Paclitaxel      Carboplatin   **Always confirm dose/schedule in your pharmacy ordering system**  REASON: Continuation Of Treatment PRIOR TREATMENT: [id number]: Carboplatin AUC=2 + Paclitaxel 45 mg/m2 Weekly During Radiation TREATMENT RESPONSE: Partial Response (PR)  START ON PATHWAY REGIMEN - Non-Small Cell Lung     A cycle is every 28 days:     Durvalumab   **Always confirm dose/schedule in your pharmacy ordering system**  Patient Characteristics: Preoperative or Nonsurgical Candidate (Clinical Staging), Stage III - Nonsurgical Candidate (Nonsquamous and Squamous), PS = 0, 1 Therapeutic Status: Preoperative or Nonsurgical Candidate (Clinical Staging) AJCC T Category: cT1c AJCC N Category: cN2 AJCC M Category: cM0 AJCC 8 Stage Grouping: IIIA ECOG Performance Status: 1 Intent of Therapy: Non-Curative / Palliative Intent, Discussed with Patient

## 2022-08-16 ENCOUNTER — Other Ambulatory Visit: Payer: Self-pay

## 2022-08-17 ENCOUNTER — Encounter (HOSPITAL_COMMUNITY): Payer: Self-pay | Admitting: Nurse Practitioner

## 2022-08-17 ENCOUNTER — Other Ambulatory Visit: Payer: Self-pay

## 2022-08-17 ENCOUNTER — Ambulatory Visit (HOSPITAL_COMMUNITY)
Admission: RE | Admit: 2022-08-17 | Discharge: 2022-08-17 | Disposition: A | Discharge status: Home / Self Care | Payer: Medicare HMO | Source: Ambulatory Visit | Attending: Nurse Practitioner | Admitting: Nurse Practitioner

## 2022-08-17 VITALS — BP 124/70 | HR 70 | Ht 63.0 in | Wt 155.2 lb

## 2022-08-17 DIAGNOSIS — J449 Chronic obstructive pulmonary disease, unspecified: Secondary | ICD-10-CM | POA: Diagnosis not present

## 2022-08-17 DIAGNOSIS — Z79899 Other long term (current) drug therapy: Secondary | ICD-10-CM | POA: Insufficient documentation

## 2022-08-17 DIAGNOSIS — Z7901 Long term (current) use of anticoagulants: Secondary | ICD-10-CM | POA: Insufficient documentation

## 2022-08-17 DIAGNOSIS — I10 Essential (primary) hypertension: Secondary | ICD-10-CM | POA: Insufficient documentation

## 2022-08-17 DIAGNOSIS — F419 Anxiety disorder, unspecified: Secondary | ICD-10-CM | POA: Diagnosis not present

## 2022-08-17 DIAGNOSIS — I4891 Unspecified atrial fibrillation: Secondary | ICD-10-CM

## 2022-08-17 DIAGNOSIS — D6869 Other thrombophilia: Secondary | ICD-10-CM | POA: Diagnosis not present

## 2022-08-17 DIAGNOSIS — E785 Hyperlipidemia, unspecified: Secondary | ICD-10-CM | POA: Insufficient documentation

## 2022-08-17 DIAGNOSIS — R69 Illness, unspecified: Secondary | ICD-10-CM | POA: Diagnosis not present

## 2022-08-17 NOTE — Progress Notes (Signed)
Primary Care Physician: [name], MD Referring Physician: [facility] f/u    Amy [name] is a 76 y.o. 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  Patient with poor oral intake due to nausea and emesis.  Patient tried antiemetics and Carafate but unable to keep anything down.  Patient presented to the ED noted to be new onset A-fib and placed on a Cardizem drip.  Hematology/oncology consulted and following, GI consulted.  CT chest was done with findings noted below.  Patient for EGD with possible intervention 07/18/2022.  Ptis now in the afib clinic for f/u. She appears to be in a sinus tach at 120 bpm today and reports still with poor oral intake . Vomiting still on a daily to several times a week basis. Many foods/liquids burn her esophagus on swallowing. She is unaware of her heart rhythm. She is on eliquis 5 mg bid with diltiazem 240 mg daily.   F/u in afib clinic 08/17/22. She has been able to start drinking and eating better since I saw her last and she has a Heart rate in SR in the 70's. She looks like she feels better. She is in SR today and has not noted any irregularity to her heart rate.    Today, she denies symptoms of palpitations, chest pain, shortness of breath, orthopnea, PND, lower extremity edema, dizziness, presyncope, syncope, or neurologic sequela. The patient is tolerating medications without difficulties and is otherwise without complaint today.   Past Medical History:  Diagnosis Date   Anxiety    Arthritis    Chronic kidney disease    COPD (chronic obstructive pulmonary disease) ([place]) 2021   Depression 01/28/2020   Dyspnea    History of hiatal hernia    Hyperlipidemia    Hypertension    Pneumonia    Squamous cell carcinoma of bronchus in right lower lobe ([place]) 04/06/2020   T1,  N0, stage Ia.  Status post right lower lobe superior segmentectomy   Past Surgical History:  Procedure Laterality Date   APPENDECTOMY     BALLOON DILATION N/A 07/18/2022   Procedure: BALLOON DILATION;  Surgeon: Ronnette Juniper, MD;  Location: [facility];  Service: Gastroenterology;  Laterality: N/A;   BIOPSY  07/18/2022   Procedure: BIOPSY;  Surgeon: Ronnette Juniper, MD;  Location: [facility];  Service: Gastroenterology;;   ESOPHAGOGASTRODUODENOSCOPY N/A 07/18/2022   Procedure: ESOPHAGOGASTRODUODENOSCOPY (EGD);  Surgeon: Ronnette Juniper, MD;  Location: [facility];  Service: Gastroenterology;  Laterality: N/A;   EYE SURGERY     INTERCOSTAL NERVE BLOCK Right 03/17/2020   Procedure: Intercostal Nerve Block;  Surgeon: [name], MD;  Location: [facility];  Service: Thoracic;  Laterality: Right;   LUNG SURGERY Right 03/17/2020   NODE DISSECTION Right 03/17/2020   Procedure: Node Dissection;  Surgeon: [name], MD;  Location: [facility];  Service: Thoracic;  Laterality: Right;   TUBAL LIGATION     VIDEO BRONCHOSCOPY WITH ENDOBRONCHIAL ULTRASOUND N/A 05/22/2022   Procedure: VIDEO BRONCHOSCOPY WITH ENDOBRONCHIAL ULTRASOUND;  Surgeon: [name], MD;  Location: [facility];  Service: Thoracic;  Laterality: N/A;    Current Outpatient Medications  Medication Sig Dispense Refill   acetaminophen (TYLENOL) 500 MG tablet Take 500-1,000 mg by mouth every 6 (six) hours as needed for mild pain or headache.     ADVAIR DISKUS 250-50 MCG/ACT  AEPB Inhale 1 puff into the lungs 2 (two) times daily.     albuterol (VENTOLIN HFA) 108 (90 Base) MCG/ACT inhaler Inhale 2 puffs into the lungs every 6 (six) hours as needed for wheezing or shortness of breath.     apixaban (ELIQUIS) 5 MG TABS tablet Take 1 tablet (5 mg total) by mouth 2 (two) times daily. 60 tablet 0   diltiazem (CARDIZEM LA) 240 MG 24 hr tablet Take 1 tablet (240 mg total) by mouth daily. 30 tablet 0   OVER THE COUNTER MEDICATION Take 1  capsule by mouth daily. Linseed oil + Omega 3 combination supplement- Take 1 capsule by mouth once a day     pantoprazole (PROTONIX) 40 MG tablet Take 1 tablet (40 mg total) by mouth 2 (two) times daily. 60 tablet 0   prochlorperazine (COMPAZINE) 10 MG tablet Take 1 tablet (10 mg total) by mouth every 6 (six) hours as needed for nausea or vomiting. 30 tablet 0   raloxifene (EVISTA) 60 MG tablet Take 60 mg by mouth daily.     sertraline (ZOLOFT) 50 MG tablet Take 50 mg by mouth daily.     simvastatin (ZOCOR) 10 MG tablet Take 10 mg by mouth at bedtime.     traZODone (DESYREL) 50 MG tablet Take 25 mg by mouth See admin instructions. Take one to two 25 mg half-tablets by mouth as needed at bedtime     No current facility-administered medications for this encounter.    No Known Allergies  Social History   Socioeconomic History   Marital status: Married    Spouse name: Not on file   Number of children: Not on file   Years of education: Not on file   Highest education level: Not on file  Occupational History   Not on file  Tobacco Use   Smoking status: Former    Packs/day: 1.00    Years: 55.00    Total pack years: 55.00    Types: Cigarettes    Quit date: 03/25/2020    Years since quitting: 2.3   Smokeless tobacco: Never  Vaping Use   Vaping Use: Not on file  Substance and Sexual Activity   Alcohol use: Yes    Comment: Social with wine   Drug use: Never   Sexual activity: Not on file  Other Topics Concern   Not on file  Social History Narrative   Not on file   Social Determinants of Health   Financial Resource Strain: Not on file  Food Insecurity: No Food Insecurity (07/14/2022)   Hunger Vital Sign    Worried About Running Out of Food in the Last Year: Never true    Ran Out of Food in the Last Year: Never true  Transportation Needs: No Transportation Needs (07/14/2022)   PRAPARE - Hydrologist (Medical): No    Lack of Transportation  (Non-Medical): No  Physical Activity: Not on file  Stress: Not on file  Social Connections: Not on file  Intimate Partner Violence: Not At Risk (07/14/2022)   Humiliation, Afraid, Rape, and Kick questionnaire    Fear of Current or Ex-Partner: No    Emotionally Abused: No    Physically Abused: No    Sexually Abused: No    No family history on file.  ROS- All systems are reviewed and negative except as per the HPI above  Physical Exam: Vitals:   08/17/22 1404  BP: 124/70  Pulse: 70  Weight: 70.4 kg  Height: 5\' 3"  (1.6 m)   Wt Readings from Last 3 Encounters:  08/17/22 70.4 kg  08/15/22 70.3 kg  07/26/22 71.1 kg    Labs: Lab Results  Component Value Date   NA 142 08/15/2022   K 3.6 08/15/2022   CL 105 08/15/2022   CO2 30 08/15/2022   GLUCOSE 90 08/15/2022   BUN 13 08/15/2022   CREATININE 1.20 (H) 08/15/2022   CALCIUM 9.1 08/15/2022   MG 1.6 (L) 07/20/2022   Lab Results  Component Value Date   INR 1.0 05/22/2022   No results found for: "CHOL", "HDL", "LDLCALC", "TRIG"   GEN- The patient is well appearing, alert and oriented x 3 today.   Head- normocephalic, atraumatic Eyes-  Sclera clear, conjunctiva pink Ears- hearing intact Oropharynx- clear Neck- supple, no JVP Lymph- no cervical lymphadenopathy Lungs- Clear to ausculation bilaterally, normal work of breathing Heart- Regular rate and rhythm, no murmurs, rubs or gallops, PMI not laterally displaced GI- soft, NT, ND, + BS Extremities- no clubbing, cyanosis, or edema MS- no significant deformity or atrophy Skin- no rash or lesion Psych- euthymic mood, full affect Neuro- strength and sensation are intact  EKG- sinus rhythm at 70 bpm, pr in 172 ms, qrs int 76 ms, qtc 401 ms   Assessment and Plan:   1. Afib  New onset in the ED associated with poor oral intake and dehydration  Now in Sinus rhythm  with normal v rates, pt has been able to resume eating and drinking per normal, feels she had  irritation  in her esophagus that finally healed now that radiation treatments are over. She will continue diltiazem 240 mg daily  Continue eliquis 5 mg bid for a CHA2DS2VASc score of at least 3 Results from  2 week zio patch is pending and I will notify pt once results are in    F/u with Dr. Virgina Jock as scheduled    Geroge Baseman. Elivia Robotham, [facility] [address] [place], [place] [address] [phone]

## 2022-08-18 DIAGNOSIS — I4891 Unspecified atrial fibrillation: Secondary | ICD-10-CM | POA: Diagnosis not present

## 2022-08-18 NOTE — Addendum Note (Signed)
Encounter addended by: Juluis Mire, RN on: 08/18/2022 11:05 AM  Actions taken: Imaging Exam ended

## 2022-08-21 ENCOUNTER — Ambulatory Visit: Payer: Medicare HMO | Admitting: Cardiology

## 2022-08-21 ENCOUNTER — Encounter: Payer: Self-pay | Admitting: Cardiology

## 2022-08-21 ENCOUNTER — Telehealth: Payer: Self-pay | Admitting: Internal Medicine

## 2022-08-21 VITALS — BP 110/68 | HR 87 | Resp 16 | Ht 63.0 in | Wt 157.0 lb

## 2022-08-21 DIAGNOSIS — I480 Paroxysmal atrial fibrillation: Secondary | ICD-10-CM | POA: Diagnosis not present

## 2022-08-21 MED ORDER — DILTIAZEM HCL ER 240 MG PO TB24: 240.0000 mg | ORAL_TABLET | Freq: Every day | ORAL | 3 refills | Status: DC

## 2022-08-21 MED ORDER — APIXABAN 5 MG PO TABS: 5.0000 mg | ORAL_TABLET | Freq: Two times a day (BID) | ORAL | 3 refills | Status: DC

## 2022-08-21 NOTE — Telephone Encounter (Signed)
Scheduled per 10/31 los, patient has been called and notified of upcoming appointments.

## 2022-08-21 NOTE — Progress Notes (Signed)
Follow up visit  Subjective:   Amy [name], female    DOB: October 25, 1945, 76 y.o.   MRN: [id number]   HPI  Chief Complaint  Patient presents with   Atrial Fibrillation   Follow-up    3-4 week   76 y.o. Caucasian female  with hypertension, hyperlipidemia, recurrent squamous cell carcinoma of right lower lobe lung, former smoker, COPD, odynophagia, PAF  Patient is doing well Odynophagia has improved on pantoprazole. No bleeding issues noted. She is going to undergo chemotherapy infusion for her cancer.    Current Outpatient Medications:    acetaminophen (TYLENOL) 500 MG tablet, Take 500-1,000 mg by mouth every 6 (six) hours as needed for mild pain or headache., Disp: , Rfl:    ADVAIR DISKUS 250-50 MCG/ACT AEPB, Inhale 1 puff into the lungs 2 (two) times daily., Disp: , Rfl:    albuterol (VENTOLIN HFA) 108 (90 Base) MCG/ACT inhaler, Inhale 2 puffs into the lungs every 6 (six) hours as needed for wheezing or shortness of breath., Disp: , Rfl:    apixaban (ELIQUIS) 5 MG TABS tablet, Take 1 tablet (5 mg total) by mouth 2 (two) times daily., Disp: 60 tablet, Rfl: 0   diltiazem (CARDIZEM LA) 240 MG 24 hr tablet, Take 1 tablet (240 mg total) by mouth daily., Disp: 30 tablet, Rfl: 0   pantoprazole (PROTONIX) 40 MG tablet, Take 1 tablet (40 mg total) by mouth 2 (two) times daily., Disp: 60 tablet, Rfl: 0   prochlorperazine (COMPAZINE) 10 MG tablet, Take 1 tablet (10 mg total) by mouth every 6 (six) hours as needed for nausea or vomiting., Disp: 30 tablet, Rfl: 0   raloxifene (EVISTA) 60 MG tablet, Take 60 mg by mouth daily., Disp: , Rfl:    sertraline (ZOLOFT) 50 MG tablet, Take 50 mg by mouth daily., Disp: , Rfl:    simvastatin (ZOCOR) 10 MG tablet, Take 10 mg by mouth at bedtime., Disp: , Rfl:    traZODone (DESYREL) 50 MG tablet, Take 25 mg by mouth See admin instructions. Take one to two 25 mg half-tablets by mouth as needed at bedtime, Disp: , Rfl:    Cardiovascular & other pertient  studies:  Reviewed external labs and tests, independently interpreted  EKG 08/17/2022: Sinus rhythm with Premature atrial complexes Otherwise normal ECG  Zio patch 08/18/2022: Patch Wear Time:  13 days and 18 hours  Predominant rhythm was sinus rhythm 1806 SVT episodes, longest 16.5 seconds Frequent PACs, 22.4% Less than 1% ventricular ectopy No triggered events recorded  CT chest 07/17/2022: 1. Stable exam. No new or progressive findings to suggest recurrent or metastatic disease. 2. Post treatment scarring in the posterior and medial right lung is stable in the interval. 3. Stable scattered bilateral tiny pulmonary nodules. 4. Debris in the upper esophagus may be related to dysmotility or reflux. Circumferential wall thickening noted previously in the esophagus is less prominent today. 5. Tiny bilateral pleural effusions seen previously have resolved on the left and decreased on the right. 6.  Aortic Atherosclerosis (ICD10-I70.0).   Echocardiogram 07/15/2022: 1. Left ventricular ejection fraction, by estimation, is >75%. The left  ventricle has hyperdynamic function. The left ventricle has no regional  wall motion abnormalities. Left ventricular diastolic function could not  be evaluated.   2. Right ventricular systolic function was not well visualized. The right  ventricular size is not well visualized. Tricuspid regurgitation signal is  inadequate for assessing PA pressure.   3. The mitral valve is grossly normal. No evidence  of mitral valve  regurgitation.   4. The aortic valve is tricuspid. Aortic valve regurgitation is not  visualized. Aortic valve sclerosis is present, with no evidence of aortic  valve stenosis.   5. The inferior vena cava is normal in size with greater than 50%  respiratory variability, suggesting right atrial pressure of 3 mmHg.    Recent labs: 08/15/2022: Glucose 90, BUN/Cr 13/1.20. EGFR 46. Na/K 142/3.6. Rest of the CMP normal H/H 12/36. MCV  91. Platelets 182   Review of Systems  Cardiovascular:  Negative for chest pain, dyspnea on exertion, leg swelling, palpitations and syncope.        Vitals:   08/21/22 1016  BP: 110/68  Pulse: 87  Resp: 16  SpO2: 95%    Body mass index is 27.81 kg/m. Filed Weights   08/21/22 1016  Weight: 157 lb (71.2 kg)     Objective:   Physical Exam Vitals and nursing note reviewed.  Constitutional:      General: She is not in acute distress. Neck:     Vascular: No JVD.  Cardiovascular:     Rate and Rhythm: Normal rate and regular rhythm.     Heart sounds: Normal heart sounds. No murmur heard. Pulmonary:     Effort: Pulmonary effort is normal.     Breath sounds: Normal breath sounds. No wheezing or rales.          Visit diagnoses:   ICD-10-CM   1. Paroxysmal atrial fibrillation (HCC)  I48.0           Medication changes this visit: Medications Discontinued During This Encounter  Medication Reason   OVER THE COUNTER MEDICATION    diltiazem (CARDIZEM LA) 240 MG 24 hr tablet Reorder   apixaban (ELIQUIS) 5 MG TABS tablet Reorder    Meds ordered this encounter  Medications   diltiazem (CARDIZEM LA) 240 MG 24 hr tablet    Sig: Take 1 tablet (240 mg total) by mouth daily.    Dispense:  180 tablet    Refill:  3   apixaban (ELIQUIS) 5 MG TABS tablet    Sig: Take 1 tablet (5 mg total) by mouth 2 (two) times daily.    Dispense:  180 tablet    Refill:  3     Assessment & Recommendations:   76 y.o. Caucasian female  with hypertension, hyperlipidemia, recurrent squamous cell carcinoma of right lower lobe lung, former smoker, COPD, odynophagia, PAF  PAF: Currently in sinus rhythm Continue diltiazem 240 mg daily. CHA2DS2VASc score 4, annual stroke risk 4.8%.  Continue Eliquis 5 mg bid.  F/u in 1 year    Nigel Mormon, MD Pager: [phone] Office: [phone]

## 2022-08-22 ENCOUNTER — Other Ambulatory Visit: Payer: Self-pay

## 2022-08-22 ENCOUNTER — Inpatient Hospital Stay: Payer: Medicare HMO | Attending: Internal Medicine

## 2022-08-22 ENCOUNTER — Inpatient Hospital Stay: Payer: Medicare HMO

## 2022-08-22 VITALS — BP 130/51 | HR 64 | Temp 98.3°F | Resp 18

## 2022-08-22 DIAGNOSIS — C3431 Malignant neoplasm of lower lobe, right bronchus or lung: Secondary | ICD-10-CM | POA: Insufficient documentation

## 2022-08-22 DIAGNOSIS — Z79899 Other long term (current) drug therapy: Secondary | ICD-10-CM | POA: Diagnosis not present

## 2022-08-22 DIAGNOSIS — Z5112 Encounter for antineoplastic immunotherapy: Secondary | ICD-10-CM | POA: Insufficient documentation

## 2022-08-22 LAB — CBC WITH DIFFERENTIAL (CANCER CENTER ONLY)
Abs Immature Granulocytes: 0.01 10*3/uL (ref 0.00–0.07)
Basophils Absolute: 0 10*3/uL (ref 0.0–0.1)
Basophils Relative: 1 %
Eosinophils Absolute: 0.1 10*3/uL (ref 0.0–0.5)
Eosinophils Relative: 2 %
HCT: 33.3 % — ABNORMAL LOW (ref 36.0–46.0)
Hemoglobin: 11.5 g/dL — ABNORMAL LOW (ref 12.0–15.0)
Immature Granulocytes: 0 %
Lymphocytes Relative: 11 %
Lymphs Abs: 0.6 10*3/uL — ABNORMAL LOW (ref 0.7–4.0)
MCH: 32.2 pg (ref 26.0–34.0)
MCHC: 34.5 g/dL (ref 30.0–36.0)
MCV: 93.3 fL (ref 80.0–100.0)
Monocytes Absolute: 0.4 10*3/uL (ref 0.1–1.0)
Monocytes Relative: 9 %
Neutro Abs: 3.9 10*3/uL (ref 1.7–7.7)
Neutrophils Relative %: 77 %
Platelet Count: 201 10*3/uL (ref 150–400)
RBC: 3.57 MIL/uL — ABNORMAL LOW (ref 3.87–5.11)
RDW: 18.8 % — ABNORMAL HIGH (ref 11.5–15.5)
WBC Count: 5.1 10*3/uL (ref 4.0–10.5)
nRBC: 0 % (ref 0.0–0.2)

## 2022-08-22 LAB — CMP (CANCER CENTER ONLY)
ALT: 12 U/L (ref 0–44)
AST: 17 U/L (ref 15–41)
Albumin: 3.7 g/dL (ref 3.5–5.0)
Alkaline Phosphatase: 61 U/L (ref 38–126)
Anion gap: 8 (ref 5–15)
BUN: 17 mg/dL (ref 8–23)
CO2: 26 mmol/L (ref 22–32)
Calcium: 9.3 mg/dL (ref 8.9–10.3)
Chloride: 107 mmol/L (ref 98–111)
Creatinine: 1.22 mg/dL — ABNORMAL HIGH (ref 0.44–1.00)
GFR, Estimated: 45 mL/min — ABNORMAL LOW (ref >60)
Glucose, Bld: 133 mg/dL — ABNORMAL HIGH (ref 70–99)
Potassium: 3.3 mmol/L — ABNORMAL LOW (ref 3.5–5.1)
Sodium: 141 mmol/L (ref 135–145)
Total Bilirubin: 0.8 mg/dL (ref 0.3–1.2)
Total Protein: 6.1 g/dL — ABNORMAL LOW (ref 6.5–8.1)

## 2022-08-22 LAB — TSH: TSH: 2.034 u[IU]/mL (ref 0.350–4.500)

## 2022-08-22 MED ORDER — SODIUM CHLORIDE 0.9 % IV SOLN: Freq: Once | INTRAVENOUS | Status: AC

## 2022-08-22 MED ORDER — SODIUM CHLORIDE 0.9 % IV SOLN
1500.0000 mg | Freq: Once | INTRAVENOUS | Status: AC
  Administered 2022-08-22: 1500 mg via INTRAVENOUS
  Filled 2022-08-22: qty 30

## 2022-08-22 NOTE — Patient Instructions (Addendum)
[facility]  Discharge Instructions: Thank you for choosing [facility] to provide your oncology and hematology care.   If you have a lab appointment with the [facility], please go directly to the [facility] and check in at the registration area.   Wear comfortable clothing and clothing appropriate for easy access to any Portacath or PICC line.   We strive to give you quality time with your provider. You may need to reschedule your appointment if you arrive late (15 or more minutes).  Arriving late affects you and other patients whose appointments are after yours.  Also, if you miss three or more appointments without notifying the office, you may be dismissed from the clinic at the provider's discretion.      For prescription refill requests, have your pharmacy contact our office and allow 72 hours for refills to be completed.    Today you received the following chemotherapy and/or immunotherapy agents: Durvalumab (Imfinzi)     To help prevent nausea and vomiting after your treatment, we encourage you to take your nausea medication as directed.  BELOW ARE SYMPTOMS THAT SHOULD BE REPORTED IMMEDIATELY: *FEVER GREATER THAN 100.4 F (38 C) OR HIGHER *CHILLS OR SWEATING *NAUSEA AND VOMITING THAT IS NOT CONTROLLED WITH YOUR NAUSEA MEDICATION *UNUSUAL SHORTNESS OF BREATH *UNUSUAL BRUISING OR BLEEDING *URINARY PROBLEMS (pain or burning when urinating, or frequent urination) *BOWEL PROBLEMS (unusual diarrhea, constipation, pain near the anus) TENDERNESS IN MOUTH AND THROAT WITH OR WITHOUT PRESENCE OF ULCERS (sore throat, sores in mouth, or a toothache) UNUSUAL RASH, SWELLING OR PAIN  UNUSUAL VAGINAL DISCHARGE OR ITCHING   Items with * indicate a potential emergency and should be followed up as soon as possible or go to the Emergency Department if any problems should occur.  Please show the CHEMOTHERAPY ALERT CARD or IMMUNOTHERAPY ALERT CARD at  check-in to the Emergency Department and triage nurse.  Should you have questions after your visit or need to cancel or reschedule your appointment, please contact [facility]  Dept: [phone]  and follow the prompts.  Office hours are 8:00 a.m. to 4:30 p.m. Monday - Friday. Please note that voicemails left after 4:00 p.m. may not be returned until the following business day.  We are closed weekends and major holidays. You have access to a nurse at all times for urgent questions. Please call the main number to the clinic Dept: [phone] and follow the prompts.   For any non-urgent questions, you may also contact your provider using MyChart. We now offer e-Visits for anyone 74 and older to request care online for non-urgent symptoms. For details visit [email or web address].   Also download the MyChart app! Go to the app store, search "MyChart", open the app, select [facility], and log in with your MyChart username and password.  Masks are optional in the cancer centers. If you would like for your care team to wear a mask while they are taking care of you, please let them know. You may have one support person who is at least 78 years old accompany you for your appointments.

## 2022-08-23 ENCOUNTER — Telehealth: Payer: Self-pay

## 2022-08-23 ENCOUNTER — Encounter (HOSPITAL_COMMUNITY): Payer: Self-pay | Admitting: *Deleted

## 2022-08-23 DIAGNOSIS — C3431 Malignant neoplasm of lower lobe, right bronchus or lung: Secondary | ICD-10-CM

## 2022-08-23 NOTE — Telephone Encounter (Signed)
Amy [name] states she is doing fine. She is eating, drinking, and urinating well. She knows to call the office at [phone] if she has questions or concerns.

## 2022-08-23 NOTE — Telephone Encounter (Signed)
-----   Message from Rafael Bihari, RN sent at 08/22/2022  4:18 PM EST ----- Regarding: Dr Julien Nordmann pt, first time Imfinzi Dr Julien Nordmann pt came in 11/7 for first time Imfinzi. Tolerated infusion well. Needs call back.

## 2022-09-05 ENCOUNTER — Encounter: Payer: Self-pay | Admitting: Internal Medicine

## 2022-09-05 NOTE — Progress Notes (Signed)
  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  Beams/energy:   A five field 3D conformal treatment arrangement was used delivering 6 and 10 MV photons.  Daily image-guidance CT was used to align the treatment with the targeted volume  Narrative: The patient tolerated radiation treatment relatively well.  She experienced some esophagitis characterized as moderate-severe.  The patient also noted fatigue. She presented to the ED on 07/14/22 with c/o difficulty swallowing and dehydration and it was at that time that she and her family decided to discontinue the radiation treatments.  Plan: The patient has completed 28 of the planned 33 radiation treatments and is adamant that she does not wish to proceed with further radiation treatments and instead will continue in routine follow up under the care and direction of Dr. Julien Nordmann for continued disease management with systemic therapy.  ________________________________  Sheral Apley Tammi Klippel, M.D.

## 2022-09-06 ENCOUNTER — Ambulatory Visit
Admission: RE | Admit: 2022-09-06 | Discharge: 2022-09-06 | Disposition: A | Discharge status: Home / Self Care | Payer: Medicare HMO | Source: Ambulatory Visit | Attending: Family Medicine | Admitting: Family Medicine

## 2022-09-06 DIAGNOSIS — Z1231 Encounter for screening mammogram for malignant neoplasm of breast: Secondary | ICD-10-CM

## 2022-09-19 ENCOUNTER — Inpatient Hospital Stay: Payer: Medicare HMO | Attending: Internal Medicine

## 2022-09-19 ENCOUNTER — Inpatient Hospital Stay: Payer: Medicare HMO

## 2022-09-19 ENCOUNTER — Inpatient Hospital Stay (HOSPITAL_BASED_OUTPATIENT_CLINIC_OR_DEPARTMENT_OTHER): Payer: Medicare HMO | Admitting: Internal Medicine

## 2022-09-19 DIAGNOSIS — R918 Other nonspecific abnormal finding of lung field: Secondary | ICD-10-CM | POA: Insufficient documentation

## 2022-09-19 DIAGNOSIS — N289 Disorder of kidney and ureter, unspecified: Secondary | ICD-10-CM | POA: Diagnosis not present

## 2022-09-19 DIAGNOSIS — C3431 Malignant neoplasm of lower lobe, right bronchus or lung: Secondary | ICD-10-CM

## 2022-09-19 DIAGNOSIS — Z5112 Encounter for antineoplastic immunotherapy: Secondary | ICD-10-CM | POA: Insufficient documentation

## 2022-09-19 DIAGNOSIS — M549 Dorsalgia, unspecified: Secondary | ICD-10-CM | POA: Insufficient documentation

## 2022-09-19 LAB — CBC WITH DIFFERENTIAL (CANCER CENTER ONLY)
Abs Immature Granulocytes: 0.03 10*3/uL (ref 0.00–0.07)
Basophils Absolute: 0 10*3/uL (ref 0.0–0.1)
Basophils Relative: 1 %
Eosinophils Absolute: 0.1 10*3/uL (ref 0.0–0.5)
Eosinophils Relative: 1 %
HCT: 34.7 % — ABNORMAL LOW (ref 36.0–46.0)
Hemoglobin: 12 g/dL (ref 12.0–15.0)
Immature Granulocytes: 0 %
Lymphocytes Relative: 9 %
Lymphs Abs: 0.7 10*3/uL (ref 0.7–4.0)
MCH: 33.9 pg (ref 26.0–34.0)
MCHC: 34.6 g/dL (ref 30.0–36.0)
MCV: 98 fL (ref 80.0–100.0)
Monocytes Absolute: 0.7 10*3/uL (ref 0.1–1.0)
Monocytes Relative: 8 %
Neutro Abs: 6.8 10*3/uL (ref 1.7–7.7)
Neutrophils Relative %: 81 %
Platelet Count: 253 10*3/uL (ref 150–400)
RBC: 3.54 MIL/uL — ABNORMAL LOW (ref 3.87–5.11)
RDW: 14.4 % (ref 11.5–15.5)
WBC Count: 8.4 10*3/uL (ref 4.0–10.5)
nRBC: 0 % (ref 0.0–0.2)

## 2022-09-19 LAB — CMP (CANCER CENTER ONLY)
ALT: 12 U/L (ref 0–44)
AST: 19 U/L (ref 15–41)
Albumin: 4.4 g/dL (ref 3.5–5.0)
Alkaline Phosphatase: 73 U/L (ref 38–126)
Anion gap: 10 (ref 5–15)
BUN: 22 mg/dL (ref 8–23)
CO2: 23 mmol/L (ref 22–32)
Calcium: 10 mg/dL (ref 8.9–10.3)
Chloride: 105 mmol/L (ref 98–111)
Creatinine: 1.4 mg/dL — ABNORMAL HIGH (ref 0.44–1.00)
GFR, Estimated: 39 mL/min — ABNORMAL LOW (ref >60)
Glucose, Bld: 102 mg/dL — ABNORMAL HIGH (ref 70–99)
Potassium: 3.5 mmol/L (ref 3.5–5.1)
Sodium: 138 mmol/L (ref 135–145)
Total Bilirubin: 0.8 mg/dL (ref 0.3–1.2)
Total Protein: 7.4 g/dL (ref 6.5–8.1)

## 2022-09-19 MED ORDER — SODIUM CHLORIDE 0.9 % IV SOLN: Freq: Once | INTRAVENOUS | Status: AC

## 2022-09-19 MED ORDER — SODIUM CHLORIDE 0.9 % IV SOLN
1500.0000 mg | Freq: Once | INTRAVENOUS | Status: AC
  Administered 2022-09-19: 1500 mg via INTRAVENOUS
  Filled 2022-09-19: qty 30

## 2022-09-19 NOTE — Progress Notes (Signed)
[facility] Telephone:([phone]) [phone]   Fax:(336) [phone]  OFFICE PROGRESS NOTE  [name], MD [place] [address]  DIAGNOSIS: Recurrent lung cancer initially diagnosed as stage IA (T1, N0, M0) non-small cell lung cancer, squamous cell carcinoma.  She presented with a right lower lobe lung nodule.  She was diagnosed in June 2021.    PRIOR THERAPY: Right lower lobe superior segmentectomy and lymph node dissection under the care of Dr. Roxan Hockey on 03/17/2020. 2) Concurrent chemoradiation with weekly carboplatin for AUC of 2 and paclitaxel 45 Mg/M2. Last dose on 07/10/22  Status post 6 cycles.   CURRENT THERAPY: Consolidation immunotherapy with Imfinzi 1500 mg IV every 4 weeks.  First dose expected on 08/22/2022.  Status post 1 cycle.  INTERVAL HISTORY: Amy [name] 76 y.o. female returns to the clinic today for follow-up visit.  The patient is feeling fine today with no concerning complaints except for pain that coming from the back around the lower rib cage.  She denied having any shortness of breath, cough or hemoptysis.  She has no nausea, vomiting, diarrhea or constipation.  She has no headache or visual changes.  She denied having any significant weight loss or night sweats.  She tolerated the first cycle of her treatment with immunotherapy fairly well.  She is here for evaluation before starting cycle #2.  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  Status post right lower lobe superior segmentectomy    ALLERGIES:  has No Known Allergies.  MEDICATIONS:  Current Outpatient Medications  Medication  Sig Dispense Refill   acetaminophen (TYLENOL) 500 MG tablet Take 500-1,000 mg by mouth every 6 (six) hours as needed for mild pain or headache.     ADVAIR DISKUS 250-50 MCG/ACT AEPB Inhale 1 puff into the lungs 2 (two) times daily.     albuterol (VENTOLIN HFA) 108 (90 Base) MCG/ACT inhaler Inhale 2 puffs into the lungs every 6 (six) hours as needed for wheezing or shortness of breath.     apixaban (ELIQUIS) 5 MG TABS tablet Take 1 tablet (5 mg total) by mouth 2 (two) times daily. 180 tablet 3   diltiazem (CARDIZEM LA) 240 MG 24 hr tablet Take 1 tablet (240 mg total) by mouth daily. 180 tablet 3   pantoprazole (PROTONIX) 40 MG tablet Take 1 tablet (40 mg total) by mouth 2 (two) times daily. 60 tablet 0   prochlorperazine (COMPAZINE) 10 MG tablet Take 1 tablet (10 mg total) by mouth every 6 (six) hours as needed for nausea or vomiting. 30 tablet 0   raloxifene (EVISTA) 60 MG tablet Take 60 mg by mouth daily.     sertraline (ZOLOFT) 50 MG tablet Take 50 mg by mouth daily.     simvastatin (ZOCOR) 10 MG tablet Take 10 mg by mouth at bedtime.     traZODone (DESYREL) 50 MG tablet Take 25 mg by mouth See admin instructions. Take one to two 25 mg half-tablets by mouth as needed at bedtime     No current facility-administered medications for  this visit.    SURGICAL HISTORY:  Past Surgical History:  Procedure Laterality Date   APPENDECTOMY     BALLOON DILATION N/A 07/18/2022   Procedure: BALLOON DILATION;  Surgeon: Ronnette Juniper, MD;  Location: [facility];  Service: Gastroenterology;  Laterality: N/A;   BIOPSY  07/18/2022   Procedure: BIOPSY;  Surgeon: Ronnette Juniper, MD;  Location: [facility];  Service: Gastroenterology;;   ESOPHAGOGASTRODUODENOSCOPY N/A 07/18/2022   Procedure: ESOPHAGOGASTRODUODENOSCOPY (EGD);  Surgeon: Ronnette Juniper, MD;  Location: [facility];  Service: Gastroenterology;  Laterality: N/A;   EYE SURGERY     INTERCOSTAL NERVE BLOCK Right 03/17/2020   Procedure: Intercostal Nerve Block;   Surgeon: [name], MD;  Location: [facility];  Service: Thoracic;  Laterality: Right;   LUNG SURGERY Right 03/17/2020   NODE DISSECTION Right 03/17/2020   Procedure: Node Dissection;  Surgeon: [name], MD;  Location: [facility];  Service: Thoracic;  Laterality: Right;   TUBAL LIGATION     VIDEO BRONCHOSCOPY WITH ENDOBRONCHIAL ULTRASOUND N/A 05/22/2022   Procedure: VIDEO BRONCHOSCOPY WITH ENDOBRONCHIAL ULTRASOUND;  Surgeon: [name], MD;  Location: [facility];  Service: Thoracic;  Laterality: N/A;    REVIEW OF SYSTEMS:  A comprehensive review of systems was negative.   PHYSICAL EXAMINATION: General appearance: alert, cooperative, and no distress Head: Normocephalic, without obvious abnormality, atraumatic Neck: no adenopathy, no JVD, supple, symmetrical, trachea midline, and thyroid not enlarged, symmetric, no tenderness/mass/nodules Lymph nodes: Cervical, supraclavicular, and axillary nodes normal. Resp: clear to auscultation bilaterally Back: symmetric, no curvature. ROM normal. No CVA tenderness. Cardio: regular rate and rhythm, S1, S2 normal, no murmur, click, rub or gallop GI: soft, non-tender; bowel sounds normal; no masses,  no organomegaly Extremities: extremities normal, atraumatic, no cyanosis or edema  ECOG PERFORMANCE STATUS: 1 - Symptomatic but completely ambulatory  Blood pressure (!) 155/66, pulse 86, temperature 97.7 F (36.5 C), temperature source Oral, resp. rate 15, height 5\' 3"  (1.6 m), weight 154 lb 4.8 oz (70 kg), SpO2 100 %.  LABORATORY DATA: Lab Results  Component Value Date   WBC 5.1 08/22/2022   HGB 11.5 (L) 08/22/2022   HCT 33.3 (L) 08/22/2022   MCV 93.3 08/22/2022   PLT 201 08/22/2022      Chemistry      Component Value Date/Time   NA 141 08/22/2022 1324   K 3.3 (L) 08/22/2022 1324   CL 107 08/22/2022 1324   CO2 26 08/22/2022 1324   BUN 17 08/22/2022 1324   CREATININE 1.22 (H) 08/22/2022 1324      Component Value  Date/Time   CALCIUM 9.3 08/22/2022 1324   ALKPHOS 61 08/22/2022 1324   AST 17 08/22/2022 1324   ALT 12 08/22/2022 1324   BILITOT 0.8 08/22/2022 1324       RADIOGRAPHIC STUDIES: MM 3D SCREEN BREAST BILATERAL  Result Date: 09/11/2022 CLINICAL DATA:  Screening. EXAM: DIGITAL SCREENING BILATERAL MAMMOGRAM WITH TOMOSYNTHESIS AND CAD TECHNIQUE: Bilateral screening digital craniocaudal and mediolateral oblique mammograms were obtained. Bilateral screening digital breast tomosynthesis was performed. The images were evaluated with computer-aided detection. COMPARISON:  Previous exam(s). ACR Breast Density Category a: The breast tissue is almost entirely fatty. FINDINGS: There are no findings suspicious for malignancy. IMPRESSION: No mammographic evidence of malignancy. A result letter of this screening mammogram will be mailed directly to the patient. RECOMMENDATION: Screening mammogram in one year. (Code:SM-B-01Y) BI-RADS CATEGORY  1: Negative. Electronically Signed   By: Ammie Ferrier M.D.   On: 09/11/2022 11:22  ASSESSMENT AND PLAN: This is a very pleasant 76 years old white female with suspicious recurrent non-small cell lung cancer initially diagnosed as history of stage IA (T1 a, N0, M0) non-small cell lung cancer, squamous cell carcinoma presented with right lower lobe lung nodule diagnosed in June 2021 status post right lower lobe superior segmentectomy with lymph node dissection under the care of Dr. Roxan Hockey on March 17, 2020.  The patient has evidence for disease recurrence in July 2023. The patient is feeling fine today with no concerning complaints except for the back pain. She had repeat CT scan of the chest performed recently.  I personally and independently reviewed the scan images and discussed the results with the patient today. Her scan showed mild increase in the right infrahilar soft tissue encasing the bronchus intermedius and disease recurrence could not be completely  excluded.  There was also new mild bilateral lower paratracheal lymphadenopathy that is nonspecific and nodal metastasis could not be completely excluded. The patient had a PET scan performed recently and that showed intensely hypermetabolic recurrence in the right infrahilar region with mildly prominent left paratracheal and paraesophageal lymph nodes also mildly hypermetabolic for size and suspicious for nodal metastasis but no distant metastatic disease. The patient underwent repeat bronchoscopy with EBUS under the care of Dr. Roxan Hockey and the final pathology was consistent with recurrent squamous cell carcinoma of the lung.  MRI of the brain was negative for malignancy. She underwent treatment with concurrent chemoradiation with weekly carboplatin for AUC of 2 and paclitaxel 45 Mg/M2.  First dose today June 05, 2022.  Status post 6 cycles.  She has partial response. The patient is currently undergoing consolidation treatment with immunotherapy with Imfinzi 1500 Mg IV every 4 weeks status post 1 cycle.  She tolerated the first cycle of her treatment fairly well. I recommended for her to proceed with cycle #2 today as planned. She will come back for follow-up visit in 4 weeks for evaluation before the next cycle of her treatment. For the renal sufficiency The patient was advised to avoid any NSAIDs and to increase her hydration. The patient was advised to call immediately if she has any other concerning symptoms in the interval. The patient voices understanding of current disease status and treatment options and is in agreement with the current care plan.  All questions were answered. The patient knows to call the clinic with any problems, questions or concerns. We can certainly see the patient much sooner if necessary. The total time spent in the appointment was 20 minutes.   Disclaimer: This note was dictated with voice recognition software. Similar sounding words can inadvertently be  transcribed and may not be corrected upon review.

## 2022-09-19 NOTE — Patient Instructions (Signed)
[facility]  Discharge Instructions: Thank you for choosing [facility] to provide your oncology and hematology care.   If you have a lab appointment with the [facility], please go directly to the [facility] and check in at the registration area.   Wear comfortable clothing and clothing appropriate for easy access to any Portacath or PICC line.   We strive to give you quality time with your provider. You may need to reschedule your appointment if you arrive late (15 or more minutes).  Arriving late affects you and other patients whose appointments are after yours.  Also, if you miss three or more appointments without notifying the office, you may be dismissed from the clinic at the provider's discretion.      For prescription refill requests, have your pharmacy contact our office and allow 72 hours for refills to be completed.    Today you received the following chemotherapy and/or immunotherapy agents: Durvalumab (Imfinzi)     To help prevent nausea and vomiting after your treatment, we encourage you to take your nausea medication as directed.  BELOW ARE SYMPTOMS THAT SHOULD BE REPORTED IMMEDIATELY: *FEVER GREATER THAN 100.4 F (38 C) OR HIGHER *CHILLS OR SWEATING *NAUSEA AND VOMITING THAT IS NOT CONTROLLED WITH YOUR NAUSEA MEDICATION *UNUSUAL SHORTNESS OF BREATH *UNUSUAL BRUISING OR BLEEDING *URINARY PROBLEMS (pain or burning when urinating, or frequent urination) *BOWEL PROBLEMS (unusual diarrhea, constipation, pain near the anus) TENDERNESS IN MOUTH AND THROAT WITH OR WITHOUT PRESENCE OF ULCERS (sore throat, sores in mouth, or a toothache) UNUSUAL RASH, SWELLING OR PAIN  UNUSUAL VAGINAL DISCHARGE OR ITCHING   Items with * indicate a potential emergency and should be followed up as soon as possible or go to the Emergency Department if any problems should occur.  Please show the CHEMOTHERAPY ALERT CARD or IMMUNOTHERAPY ALERT CARD at  check-in to the Emergency Department and triage nurse.  Should you have questions after your visit or need to cancel or reschedule your appointment, please contact [facility]  Dept: [phone]  and follow the prompts.  Office hours are 8:00 a.m. to 4:30 p.m. Monday - Friday. Please note that voicemails left after 4:00 p.m. may not be returned until the following business day.  We are closed weekends and major holidays. You have access to a nurse at all times for urgent questions. Please call the main number to the clinic Dept: [phone] and follow the prompts.   For any non-urgent questions, you may also contact your provider using MyChart. We now offer e-Visits for anyone 41 and older to request care online for non-urgent symptoms. For details visit [email or web address].   Also download the MyChart app! Go to the app store, search "MyChart", open the app, select [facility], and log in with your MyChart username and password.  Masks are optional in the cancer centers. If you would like for your care team to wear a mask while they are taking care of you, please let them know. You may have one support person who is at least 78 years old accompany you for your appointments.

## 2022-10-11 ENCOUNTER — Ambulatory Visit: Payer: Medicare HMO

## 2022-10-13 ENCOUNTER — Telehealth: Payer: Self-pay | Admitting: Internal Medicine

## 2022-10-13 NOTE — Telephone Encounter (Signed)
Called patient regarding January through March appointments. Patient is notified.

## 2022-10-18 ENCOUNTER — Inpatient Hospital Stay (HOSPITAL_BASED_OUTPATIENT_CLINIC_OR_DEPARTMENT_OTHER): Payer: Medicare HMO | Admitting: Internal Medicine

## 2022-10-18 ENCOUNTER — Inpatient Hospital Stay: Payer: Medicare HMO | Attending: Internal Medicine

## 2022-10-18 ENCOUNTER — Inpatient Hospital Stay: Payer: Medicare HMO

## 2022-10-18 VITALS — BP 141/55 | HR 78 | Temp 97.7°F | Resp 18 | Wt 153.6 lb

## 2022-10-18 DIAGNOSIS — C3431 Malignant neoplasm of lower lobe, right bronchus or lung: Secondary | ICD-10-CM | POA: Insufficient documentation

## 2022-10-18 DIAGNOSIS — Z5112 Encounter for antineoplastic immunotherapy: Secondary | ICD-10-CM | POA: Diagnosis not present

## 2022-10-18 DIAGNOSIS — M549 Dorsalgia, unspecified: Secondary | ICD-10-CM | POA: Diagnosis not present

## 2022-10-18 DIAGNOSIS — C3490 Malignant neoplasm of unspecified part of unspecified bronchus or lung: Secondary | ICD-10-CM | POA: Diagnosis not present

## 2022-10-18 DIAGNOSIS — Z79899 Other long term (current) drug therapy: Secondary | ICD-10-CM | POA: Insufficient documentation

## 2022-10-18 LAB — CBC WITH DIFFERENTIAL (CANCER CENTER ONLY)
Abs Immature Granulocytes: 0.02 10*3/uL (ref 0.00–0.07)
Basophils Absolute: 0 10*3/uL (ref 0.0–0.1)
Basophils Relative: 0 %
Eosinophils Absolute: 0.2 10*3/uL (ref 0.0–0.5)
Eosinophils Relative: 3 %
HCT: 38.2 % (ref 36.0–46.0)
Hemoglobin: 12.7 g/dL (ref 12.0–15.0)
Immature Granulocytes: 0 %
Lymphocytes Relative: 7 %
Lymphs Abs: 0.5 10*3/uL — ABNORMAL LOW (ref 0.7–4.0)
MCH: 33.1 pg (ref 26.0–34.0)
MCHC: 33.2 g/dL (ref 30.0–36.0)
MCV: 99.5 fL (ref 80.0–100.0)
Monocytes Absolute: 0.4 10*3/uL (ref 0.1–1.0)
Monocytes Relative: 6 %
Neutro Abs: 6.1 10*3/uL (ref 1.7–7.7)
Neutrophils Relative %: 84 %
Platelet Count: 266 10*3/uL (ref 150–400)
RBC: 3.84 MIL/uL — ABNORMAL LOW (ref 3.87–5.11)
RDW: 12.4 % (ref 11.5–15.5)
WBC Count: 7.3 10*3/uL (ref 4.0–10.5)
nRBC: 0 % (ref 0.0–0.2)

## 2022-10-18 LAB — CMP (CANCER CENTER ONLY)
ALT: 9 U/L (ref 0–44)
AST: 13 U/L — ABNORMAL LOW (ref 15–41)
Albumin: 3.9 g/dL (ref 3.5–5.0)
Alkaline Phosphatase: 83 U/L (ref 38–126)
Anion gap: 8 (ref 5–15)
BUN: 15 mg/dL (ref 8–23)
CO2: 28 mmol/L (ref 22–32)
Calcium: 9.7 mg/dL (ref 8.9–10.3)
Chloride: 106 mmol/L (ref 98–111)
Creatinine: 1.12 mg/dL — ABNORMAL HIGH (ref 0.44–1.00)
GFR, Estimated: 50 mL/min — ABNORMAL LOW (ref >60)
Glucose, Bld: 108 mg/dL — ABNORMAL HIGH (ref 70–99)
Potassium: 3.7 mmol/L (ref 3.5–5.1)
Sodium: 142 mmol/L (ref 135–145)
Total Bilirubin: 0.6 mg/dL (ref 0.3–1.2)
Total Protein: 6.1 g/dL — ABNORMAL LOW (ref 6.5–8.1)

## 2022-10-18 LAB — TSH: TSH: 2.78 u[IU]/mL (ref 0.350–4.500)

## 2022-10-18 MED ORDER — SODIUM CHLORIDE 0.9 % IV SOLN: Freq: Once | INTRAVENOUS | Status: AC

## 2022-10-18 MED ORDER — SODIUM CHLORIDE 0.9 % IV SOLN
1500.0000 mg | Freq: Once | INTRAVENOUS | Status: AC
  Administered 2022-10-18: 1500 mg via INTRAVENOUS
  Filled 2022-10-18: qty 30

## 2022-10-18 NOTE — Patient Instructions (Signed)
[facility]  Discharge Instructions: Thank you for choosing [facility] to provide your oncology and hematology care.   If you have a lab appointment with the [facility], please go directly to the [facility] and check in at the registration area.   Wear comfortable clothing and clothing appropriate for easy access to any Portacath or PICC line.   We strive to give you quality time with your provider. You may need to reschedule your appointment if you arrive late (15 or more minutes).  Arriving late affects you and other patients whose appointments are after yours.  Also, if you miss three or more appointments without notifying the office, you may be dismissed from the clinic at the provider's discretion.      For prescription refill requests, have your pharmacy contact our office and allow 72 hours for refills to be completed.    Today you received the following chemotherapy and/or immunotherapy agents: Imfinzi      To help prevent nausea and vomiting after your treatment, we encourage you to take your nausea medication as directed.  BELOW ARE SYMPTOMS THAT SHOULD BE REPORTED IMMEDIATELY: *FEVER GREATER THAN 100.4 F (38 C) OR HIGHER *CHILLS OR SWEATING *NAUSEA AND VOMITING THAT IS NOT CONTROLLED WITH YOUR NAUSEA MEDICATION *UNUSUAL SHORTNESS OF BREATH *UNUSUAL BRUISING OR BLEEDING *URINARY PROBLEMS (pain or burning when urinating, or frequent urination) *BOWEL PROBLEMS (unusual diarrhea, constipation, pain near the anus) TENDERNESS IN MOUTH AND THROAT WITH OR WITHOUT PRESENCE OF ULCERS (sore throat, sores in mouth, or a toothache) UNUSUAL RASH, SWELLING OR PAIN  UNUSUAL VAGINAL DISCHARGE OR ITCHING   Items with * indicate a potential emergency and should be followed up as soon as possible or go to the Emergency Department if any problems should occur.  Please show the CHEMOTHERAPY ALERT CARD or IMMUNOTHERAPY ALERT CARD at check-in to  the Emergency Department and triage nurse.  Should you have questions after your visit or need to cancel or reschedule your appointment, please contact [facility]  Dept: [phone]  and follow the prompts.  Office hours are 8:00 a.m. to 4:30 p.m. Monday - Friday. Please note that voicemails left after 4:00 p.m. may not be returned until the following business day.  We are closed weekends and major holidays. You have access to a nurse at all times for urgent questions. Please call the main number to the clinic Dept: [phone] and follow the prompts.   For any non-urgent questions, you may also contact your provider using MyChart. We now offer e-Visits for anyone 109 and older to request care online for non-urgent symptoms. For details visit [email or web address].   Also download the MyChart app! Go to the app store, search "MyChart", open the app, select [facility], and log in with your MyChart username and password.

## 2022-10-18 NOTE — Progress Notes (Signed)
[facility] [phone]   [phone]  OFFICE PROGRESS NOTE  [name], MD [place] [id number]  DIAGNOSIS: Recurrent lung cancer initially diagnosed as stage IA (T1, N0, M0) non-small cell lung cancer, squamous cell carcinoma.  She presented with a right lower lobe lung nodule.  She was diagnosed in June 2021.    PRIOR THERAPY: Right lower lobe superior segmentectomy and lymph node dissection under the care of Dr. Roxan Hockey on 03/17/2020. 2) Concurrent chemoradiation with weekly carboplatin for AUC of 2 and paclitaxel 45 Mg/M2. Last dose on 07/10/22  Status post 6 cycles.   CURRENT THERAPY: Consolidation immunotherapy with Imfinzi 1500 mg IV every 4 weeks.  First dose expected on 08/22/2022.  Status post 2 cycles.  INTERVAL HISTORY: Amy [name] 77 y.o. female returns to the clinic today for follow-up visit.  The patient is feeling very well today with no concerning complaints.  She denied having any current chest pain, shortness of breath, cough or hemoptysis.  She has no nausea, vomiting, diarrhea or constipation.  She has no headache or visual changes.  She denied having any significant weight loss or night sweats.  She has no fever or chills.  She is here today for evaluation before starting cycle #3 of her treatment.  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  Status post right lower lobe superior segmentectomy    ALLERGIES:  has No Known Allergies.  MEDICATIONS:  Current Outpatient Medications  Medication Sig Dispense Refill   acetaminophen (TYLENOL) 500 MG tablet Take  500-1,000 mg by mouth every 6 (six) hours as needed for mild pain or headache.     ADVAIR DISKUS 250-50 MCG/ACT AEPB Inhale 1 puff into the lungs 2 (two) times daily.     albuterol (VENTOLIN HFA) 108 (90 Base) MCG/ACT inhaler Inhale 2 puffs into the lungs every 6 (six) hours as needed for wheezing or shortness of breath.     apixaban (ELIQUIS) 5 MG TABS tablet Take 1 tablet (5 mg total) by mouth 2 (two) times daily. 180 tablet 3   diltiazem (CARDIZEM LA) 240 MG 24 hr tablet Take 1 tablet (240 mg total) by mouth daily. 180 tablet 3   pantoprazole (PROTONIX) 40 MG tablet Take 1 tablet (40 mg total) by mouth 2 (two) times daily. 60 tablet 0   prochlorperazine (COMPAZINE) 10 MG tablet Take 1 tablet (10 mg total) by mouth every 6 (six) hours as needed for nausea or vomiting. 30 tablet 0   raloxifene (EVISTA) 60 MG tablet Take 60 mg by mouth daily.     sertraline (ZOLOFT) 50 MG tablet Take 50 mg by mouth daily.     simvastatin (ZOCOR) 10 MG tablet Take 10 mg by mouth at bedtime.     traZODone (DESYREL) 50 MG tablet Take 25 mg by mouth See admin instructions. Take one to two 25 mg half-tablets by mouth as needed at bedtime     No current facility-administered medications for this visit.    SURGICAL HISTORY:  Past Surgical History:  Procedure Laterality Date   APPENDECTOMY     BALLOON DILATION N/A 07/18/2022   Procedure: BALLOON DILATION;  Surgeon: Ronnette Juniper, MD;  Location: [facility];  Service: Gastroenterology;  Laterality: N/A;   BIOPSY  07/18/2022   Procedure: BIOPSY;  Surgeon: Ronnette Juniper, MD;  Location: [facility];  Service: Gastroenterology;;   ESOPHAGOGASTRODUODENOSCOPY N/A 07/18/2022   Procedure: ESOPHAGOGASTRODUODENOSCOPY (EGD);  Surgeon: Ronnette Juniper, MD;  Location: [facility];  Service: Gastroenterology;  Laterality: N/A;   EYE SURGERY     INTERCOSTAL NERVE BLOCK Right 03/17/2020   Procedure: Intercostal Nerve Block;  Surgeon: [name], MD;  Location: [facility];  Service:  Thoracic;  Laterality: Right;   LUNG SURGERY Right 03/17/2020   NODE DISSECTION Right 03/17/2020   Procedure: Node Dissection;  Surgeon: [name], MD;  Location: [facility];  Service: Thoracic;  Laterality: Right;   TUBAL LIGATION     VIDEO BRONCHOSCOPY WITH ENDOBRONCHIAL ULTRASOUND N/A 05/22/2022   Procedure: VIDEO BRONCHOSCOPY WITH ENDOBRONCHIAL ULTRASOUND;  Surgeon: [name], MD;  Location: [facility];  Service: Thoracic;  Laterality: N/A;    REVIEW OF SYSTEMS:  A comprehensive review of systems was negative.   PHYSICAL EXAMINATION: General appearance: alert, cooperative, and no distress Head: Normocephalic, without obvious abnormality, atraumatic Neck: no adenopathy, no JVD, supple, symmetrical, trachea midline, and thyroid not enlarged, symmetric, no tenderness/mass/nodules Lymph nodes: Cervical, supraclavicular, and axillary nodes normal. Resp: clear to auscultation bilaterally Back: symmetric, no curvature. ROM normal. No CVA tenderness. Cardio: regular rate and rhythm, S1, S2 normal, no murmur, click, rub or gallop GI: soft, non-tender; bowel sounds normal; no masses,  no organomegaly Extremities: extremities normal, atraumatic, no cyanosis or edema  ECOG PERFORMANCE STATUS: 1 - Symptomatic but completely ambulatory  Blood pressure (!) 155/66, pulse 86, temperature 97.7 F (36.5 C), temperature source Oral, resp. rate 15, height 5\' 3"  (1.6 m), weight 154 lb 4.8 oz (70 kg), SpO2 100 %.  LABORATORY DATA: Lab Results  Component Value Date   WBC 5.1 08/22/2022   HGB 11.5 (L) 08/22/2022   HCT 33.3 (L) 08/22/2022   MCV 93.3 08/22/2022   PLT 201 08/22/2022      Chemistry      Component Value Date/Time   NA 141 08/22/2022 1324   K 3.3 (L) 08/22/2022 1324   CL 107 08/22/2022 1324   CO2 26 08/22/2022 1324   BUN 17 08/22/2022 1324   CREATININE 1.22 (H) 08/22/2022 1324      Component Value Date/Time   CALCIUM 9.3 08/22/2022 1324   ALKPHOS 61 08/22/2022 1324    AST 17 08/22/2022 1324   ALT 12 08/22/2022 1324   BILITOT 0.8 08/22/2022 1324       RADIOGRAPHIC STUDIES: MM 3D SCREEN BREAST BILATERAL  Result Date: 09/11/2022 CLINICAL DATA:  Screening. EXAM: DIGITAL SCREENING BILATERAL MAMMOGRAM WITH TOMOSYNTHESIS AND CAD TECHNIQUE: Bilateral screening digital craniocaudal and mediolateral oblique mammograms were obtained. Bilateral screening digital breast tomosynthesis was performed. The images were evaluated with computer-aided detection. COMPARISON:  Previous exam(s). ACR Breast Density Category a: The breast tissue is almost entirely fatty. FINDINGS: There are no findings suspicious for malignancy. IMPRESSION: No mammographic evidence of malignancy. A result letter of this screening mammogram will be mailed directly to the patient. RECOMMENDATION: Screening mammogram in one year. (Code:SM-B-01Y) BI-RADS CATEGORY  1: Negative. Electronically Signed   By: Ammie Ferrier M.D.   On: 09/11/2022 11:22    ASSESSMENT AND PLAN: This is a very pleasant 77 years old white  female with suspicious recurrent non-small cell lung cancer initially diagnosed as history of stage IA (T1 a, N0, M0) non-small cell lung cancer, squamous cell carcinoma presented with right lower lobe lung nodule diagnosed in June 2021 status post right lower lobe superior segmentectomy with lymph node dissection under the care of Dr. Roxan Hockey on March 17, 2020.  The patient has evidence for disease recurrence in July 2023. The patient is feeling fine today with no concerning complaints except for the back pain. She had repeat CT scan of the chest performed recently.  I personally and independently reviewed the scan images and discussed the results with the patient today. Her scan showed mild increase in the right infrahilar soft tissue encasing the bronchus intermedius and disease recurrence could not be completely excluded.  There was also new mild bilateral lower paratracheal lymphadenopathy  that is nonspecific and nodal metastasis could not be completely excluded. The patient had a PET scan performed recently and that showed intensely hypermetabolic recurrence in the right infrahilar region with mildly prominent left paratracheal and paraesophageal lymph nodes also mildly hypermetabolic for size and suspicious for nodal metastasis but no distant metastatic disease. The patient underwent repeat bronchoscopy with EBUS under the care of Dr. Roxan Hockey and the final pathology was consistent with recurrent squamous cell carcinoma of the lung.  MRI of the brain was negative for malignancy. She underwent treatment with concurrent chemoradiation with weekly carboplatin for AUC of 2 and paclitaxel 45 Mg/M2.  First dose today June 05, 2022.  Status post 6 cycles.  She has partial response. The patient is currently undergoing consolidation treatment with immunotherapy with Imfinzi 1500 Mg IV every 4 weeks status post 2 cycles.  The patient has been tolerating this treatment well with no concerning adverse effects. I recommended for her to proceed with cycle #3 today as planned. I will see her back for follow-up visit in 3 weeks for evaluation with repeat CT scan of the chest for restaging of her disease. The patient was advised to call immediately if she has any other concerning symptoms in the interval. The patient voices understanding of current disease status and treatment options and is in agreement with the current care plan.  All questions were answered. The patient knows to call the clinic with any problems, questions or concerns. We can certainly see the patient much sooner if necessary. The total time spent in the appointment was 20 minutes.   Disclaimer: This note was dictated with voice recognition software. Similar sounding words can inadvertently be transcribed and may not be corrected upon review.

## 2022-11-13 ENCOUNTER — Encounter (HOSPITAL_COMMUNITY): Payer: Self-pay

## 2022-11-13 ENCOUNTER — Ambulatory Visit (HOSPITAL_COMMUNITY)
Admission: RE | Admit: 2022-11-13 | Discharge: 2022-11-13 | Disposition: A | Discharge status: Home / Self Care | Payer: Medicare HMO | Source: Ambulatory Visit | Attending: Internal Medicine | Admitting: Internal Medicine

## 2022-11-13 DIAGNOSIS — J439 Emphysema, unspecified: Secondary | ICD-10-CM | POA: Diagnosis not present

## 2022-11-13 DIAGNOSIS — C3490 Malignant neoplasm of unspecified part of unspecified bronchus or lung: Secondary | ICD-10-CM | POA: Insufficient documentation

## 2022-11-13 DIAGNOSIS — J479 Bronchiectasis, uncomplicated: Secondary | ICD-10-CM | POA: Diagnosis not present

## 2022-11-13 DIAGNOSIS — R918 Other nonspecific abnormal finding of lung field: Secondary | ICD-10-CM | POA: Diagnosis not present

## 2022-11-13 DIAGNOSIS — J90 Pleural effusion, not elsewhere classified: Secondary | ICD-10-CM | POA: Diagnosis not present

## 2022-11-13 MED ORDER — IOHEXOL 300 MG/ML  SOLN
75.0000 mL | Freq: Once | INTRAMUSCULAR | Status: AC | PRN
  Administered 2022-11-13: 75 mL via INTRAVENOUS

## 2022-11-14 ENCOUNTER — Inpatient Hospital Stay: Payer: Medicare HMO

## 2022-11-14 ENCOUNTER — Inpatient Hospital Stay: Payer: Medicare HMO | Admitting: Internal Medicine

## 2022-11-14 ENCOUNTER — Other Ambulatory Visit: Payer: Self-pay

## 2022-11-14 VITALS — BP 120/46 | HR 67 | Temp 97.9°F | Resp 16

## 2022-11-14 VITALS — BP 124/83 | HR 89 | Temp 97.8°F | Resp 18 | Ht 63.0 in | Wt 150.8 lb

## 2022-11-14 DIAGNOSIS — C3431 Malignant neoplasm of lower lobe, right bronchus or lung: Secondary | ICD-10-CM

## 2022-11-14 DIAGNOSIS — Z79899 Other long term (current) drug therapy: Secondary | ICD-10-CM | POA: Diagnosis not present

## 2022-11-14 DIAGNOSIS — M549 Dorsalgia, unspecified: Secondary | ICD-10-CM | POA: Diagnosis not present

## 2022-11-14 DIAGNOSIS — Z5112 Encounter for antineoplastic immunotherapy: Secondary | ICD-10-CM | POA: Diagnosis not present

## 2022-11-14 LAB — CMP (CANCER CENTER ONLY)
ALT: 16 U/L (ref 0–44)
AST: 19 U/L (ref 15–41)
Albumin: 3.7 g/dL (ref 3.5–5.0)
Alkaline Phosphatase: 79 U/L (ref 38–126)
Anion gap: 6 (ref 5–15)
BUN: 17 mg/dL (ref 8–23)
CO2: 28 mmol/L (ref 22–32)
Calcium: 9.8 mg/dL (ref 8.9–10.3)
Chloride: 104 mmol/L (ref 98–111)
Creatinine: 1.2 mg/dL — ABNORMAL HIGH (ref 0.44–1.00)
GFR, Estimated: 46 mL/min — ABNORMAL LOW (ref >60)
Glucose, Bld: 78 mg/dL (ref 70–99)
Potassium: 4.5 mmol/L (ref 3.5–5.1)
Sodium: 138 mmol/L (ref 135–145)
Total Bilirubin: 0.6 mg/dL (ref 0.3–1.2)
Total Protein: 7 g/dL (ref 6.5–8.1)

## 2022-11-14 LAB — CBC WITH DIFFERENTIAL (CANCER CENTER ONLY)
Abs Immature Granulocytes: 0.03 10*3/uL (ref 0.00–0.07)
Basophils Absolute: 0 10*3/uL (ref 0.0–0.1)
Basophils Relative: 1 %
Eosinophils Absolute: 0.3 10*3/uL (ref 0.0–0.5)
Eosinophils Relative: 3 %
HCT: 36.5 % (ref 36.0–46.0)
Hemoglobin: 12.5 g/dL (ref 12.0–15.0)
Immature Granulocytes: 0 %
Lymphocytes Relative: 10 %
Lymphs Abs: 0.8 10*3/uL (ref 0.7–4.0)
MCH: 32.6 pg (ref 26.0–34.0)
MCHC: 34.2 g/dL (ref 30.0–36.0)
MCV: 95.1 fL (ref 80.0–100.0)
Monocytes Absolute: 0.7 10*3/uL (ref 0.1–1.0)
Monocytes Relative: 8 %
Neutro Abs: 6.7 10*3/uL (ref 1.7–7.7)
Neutrophils Relative %: 78 %
Platelet Count: 288 10*3/uL (ref 150–400)
RBC: 3.84 MIL/uL — ABNORMAL LOW (ref 3.87–5.11)
RDW: 12.2 % (ref 11.5–15.5)
WBC Count: 8.5 10*3/uL (ref 4.0–10.5)
nRBC: 0 % (ref 0.0–0.2)

## 2022-11-14 MED ORDER — SODIUM CHLORIDE 0.9 % IV SOLN
1500.0000 mg | Freq: Once | INTRAVENOUS | Status: AC
  Administered 2022-11-14: 1500 mg via INTRAVENOUS
  Filled 2022-11-14: qty 30

## 2022-11-14 MED ORDER — SODIUM CHLORIDE 0.9 % IV SOLN: Freq: Once | INTRAVENOUS | Status: AC

## 2022-11-14 NOTE — Progress Notes (Signed)
[place] [phone]   [phone]  PROGRESS NOTE FOR TELEMEDICINE VISITS  Amy Frees, MD [address] [place] [address]  I connected withNAME@ on 11/14/22 at  1:30 PM EST by video enabled telemedicine visit and verified that I am speaking with the correct person using two identifiers.   I discussed the limitations, risks, security and privacy concerns of performing an evaluation and management service by telemedicine and the availability of in-person appointments. I also discussed with the Amy [name] that there may be a Amy [name] responsible charge related to this service. The Amy [name] expressed understanding and agreed to proceed.  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  Amy [name] presented with a right lower lobe lung nodule.  Amy [name] was diagnosed in June 2021.    PRIOR THERAPY: Right lower lobe superior segmentectomy and lymph node dissection under the care of Dr. Roxan Hockey on 03/17/2020. 2) Concurrent chemoradiation with weekly carboplatin for AUC of 2 and paclitaxel 45 Mg/M2. Last dose on 07/10/22  Status post 6 cycles.   CURRENT THERAPY: Consolidation immunotherapy with Imfinzi 1500 mg IV every 4 weeks.  First dose expected on 08/22/2022.  Status post 3 cycles.  INTERVAL HISTORY: Amy [name] 77 y.o. female returns to the clinic today for follow-up visit accompanied by Amy [name] husband.  The Amy [name] is feeling fine today with no concerning complaints except for occasional choking with Amy [name] food.  Amy [name] denied having any current chest pain, shortness of breath, cough or hemoptysis.  Amy [name] has some nasal congestion recently.  Amy [name] denied having any fever or chills.   Amy [name] has no nausea, vomiting, diarrhea or constipation.  Amy [name] has no headache or visual changes.  Amy [name] denied having any recent weight loss or night sweats.  Amy [name] has been tolerating Amy [name] treatment with immunotherapy fairly well.  Amy [name] is here today for evaluation with repeat CT scan of the chest for restaging of Amy [name] disease.  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  Status post right lower lobe superior segmentectomy    ALLERGIES:  has No Known Allergies.  MEDICATIONS:  Current Outpatient Medications  Medication Sig Dispense Refill   acetaminophen (TYLENOL) 500 MG tablet Take 500-1,000 mg by mouth every 6 (six) hours as needed for mild pain or headache.     ADVAIR DISKUS 250-50 MCG/ACT AEPB Inhale 1 puff into the lungs 2 (two) times daily.     albuterol (VENTOLIN HFA) 108 (90 Base) MCG/ACT inhaler Inhale 2 puffs into the lungs every 6 (six) hours as needed for wheezing or shortness of breath.     apixaban (ELIQUIS) 5 MG TABS tablet Take 1 tablet (5 mg total) by mouth 2 (two) times daily. 180 tablet 3   diltiazem (CARDIZEM LA) 240 MG 24 hr tablet Take 1 tablet (240 mg total) by mouth daily. 180 tablet 3  pantoprazole (PROTONIX) 40 MG tablet Take 1 tablet (40 mg total) by mouth 2 (two) times daily. 60 tablet 0   prochlorperazine (COMPAZINE) 10 MG tablet Take 1 tablet (10 mg total) by mouth every 6 (six) hours as needed for nausea or vomiting. 30 tablet 0   raloxifene (EVISTA) 60 MG tablet Take 60 mg by mouth daily.     sertraline (ZOLOFT) 50 MG tablet Take 50 mg by mouth daily.     simvastatin (ZOCOR) 10 MG tablet Take 10 mg by mouth at bedtime.     traZODone  (DESYREL) 50 MG tablet Take 25 mg by mouth See admin instructions. Take one to two 25 mg half-tablets by mouth as needed at bedtime     No current facility-administered medications for this visit.    SURGICAL HISTORY:  Past Surgical History:  Procedure Laterality Date   APPENDECTOMY     BALLOON DILATION N/A 07/18/2022   Procedure: BALLOON DILATION;  Surgeon: Ronnette Juniper, MD;  Location: [facility];  Service: Gastroenterology;  Laterality: N/A;   BIOPSY  07/18/2022   Procedure: BIOPSY;  Surgeon: Ronnette Juniper, MD;  Location: [facility];  Service: Gastroenterology;;   ESOPHAGOGASTRODUODENOSCOPY N/A 07/18/2022   Procedure: ESOPHAGOGASTRODUODENOSCOPY (EGD);  Surgeon: Ronnette Juniper, MD;  Location: [facility];  Service: Gastroenterology;  Laterality: N/A;   EYE SURGERY     INTERCOSTAL NERVE BLOCK Right 03/17/2020   Procedure: Intercostal Nerve Block;  Surgeon: [name], MD;  Location: [facility];  Service: Thoracic;  Laterality: Right;   LUNG SURGERY Right 03/17/2020   NODE DISSECTION Right 03/17/2020   Procedure: Node Dissection;  Surgeon: [name], MD;  Location: [facility];  Service: Thoracic;  Laterality: Right;   TUBAL LIGATION     VIDEO BRONCHOSCOPY WITH ENDOBRONCHIAL ULTRASOUND N/A 05/22/2022   Procedure: VIDEO BRONCHOSCOPY WITH ENDOBRONCHIAL ULTRASOUND;  Surgeon: [name], MD;  Location: [facility];  Service: Thoracic;  Laterality: N/A;    REVIEW OF SYSTEMS:  Constitutional: negative Eyes: negative Ears, nose, mouth, throat, and face: positive for nasal congestion Respiratory: negative Cardiovascular: negative Gastrointestinal: negative Genitourinary:negative Integument/breast: negative Hematologic/lymphatic: negative Musculoskeletal:negative Neurological: negative Behavioral/Psych: negative Endocrine: negative Allergic/Immunologic: negative   ECOG PERFORMANCE STATUS: 1 - Symptomatic but completely ambulatory  Blood pressure 124/83, pulse 89,  temperature 97.8 F (36.6 C), temperature source Temporal, resp. rate 18, height 5\' 3"  (1.6 m), weight 150 lb 12.8 oz (68.4 kg), SpO2 95 %.  LABORATORY DATA: Lab Results  Component Value Date   WBC 8.5 11/14/2022   HGB 12.5 11/14/2022   HCT 36.5 11/14/2022   MCV 95.1 11/14/2022   PLT 288 11/14/2022      Chemistry      Component Value Date/Time   NA 142 10/18/2022 0831   K 3.7 10/18/2022 0831   CL 106 10/18/2022 0831   CO2 28 10/18/2022 0831   BUN 15 10/18/2022 0831   CREATININE 1.12 (H) 10/18/2022 0831      Component Value Date/Time   CALCIUM 9.7 10/18/2022 0831   ALKPHOS 83 10/18/2022 0831   AST 13 (L) 10/18/2022 0831   ALT 9 10/18/2022 0831   BILITOT 0.6 10/18/2022 0831       RADIOGRAPHIC STUDIES: CT Chest W Contrast  Result Date: 11/13/2022 CLINICAL DATA:  Non-small-cell lung cancer staging. EXAM: CT CHEST WITH CONTRAST TECHNIQUE: Multidetector CT imaging of the chest was performed during intravenous contrast administration. RADIATION DOSE REDUCTION: This exam was performed according to the departmental dose-optimization program which includes automated exposure control, adjustment  of the mA and/or kV according to Amy [name] size and/or use of iterative reconstruction technique. CONTRAST:  69mL OMNIPAQUE IOHEXOL 300 MG/ML  SOLN COMPARISON:  CT chest 08/09/2022 and older FINDINGS: Cardiovascular: Significant calcified atherosclerotic plaque along the thoracic aorta particularly arch and descending is also significant plaque along the origin of the great vessels including possible high-grade stenosis or occlusion of the left vertebral artery origin. Significant stenosis of the left subclavian artery as well. Of note the left vertebral artery originates directly from the aortic arch just proximal to the left subclavian. Heart is nonenlarged. Trace pericardial fluid. Coronary artery calcifications are seen. Mediastinum/Nodes: No specific abnormal lymph node enlargement identified in  the axillary regions, left hilum. Stable prominent nodes at the right hilum. Example immediately anterior to the right main bronchus on series 2, image 65 measures 12 x 8 mm and is unchanged. There is also some thickening along the mediastinal fat with stranding. Few small mediastinal nodes are identified. Example immediately anterior to the descending thoracic aorta on series 2, image 86 measures 8 by 8 mm and previously 6 x 6 mm. The thoracic aorta has a normal course and caliber with wall thickening. Please correlate for any clinical evidence of esophagitis. The esophagus is also patulous. The appearance is similar to that of the prior examination. Lungs/Pleura: The left lung demonstrates centrilobular emphysematous lung changes greatest along the upper lung zones. No left-sided pleural effusion or pneumothorax. Significant pleural thickening and nodularity at the left lung apex with some calcification. The pleural thickening is very nodular. There is a noncalcified left upper lobe nodule on image 36 of series 7 measuring 6 x 4 mm. Previously this would have measured 5 5 mm on the recent prior. This is stable going back to December 2021 CT scan. 5 mm nodule seen left lung on image 46 abutting the interlobar fissure is also stable. 4 mm left upper lobe nodule on image 53 laterally also stable. Additional small areas elsewhere along the left lung are stable. Right lung is volume loss with surgical changes along the posterior aspect of the right lung inferiorly abutting the pleura extending along areas of scarring. There are areas of bronchiectasis and bronchial wall thickening in this location. Significant wall thickening identified as well along the course of the central structures of the right lower lobe, similar to previous. Peripheral areas of scarring atelectatic changes underlying emphysematous lung changes once again advanced. Few tiny areas of nodularity are similar. There is 1 small focus of nodularity  right lung on series 7, image 70 measuring 4 mm. Simple attention on follow-up. Tiny right basilar pleural effusion. Stable. Upper Abdomen: Preserved adrenal glands.  Fatty liver infiltration. Musculoskeletal: Scattered degenerative changes along the spine. IMPRESSION: 1. Overall relatively stable examination with post treatment changes involving the right lung with volume loss and areas of bronchiectasis and bronchial wall thickening along the course of the central structures of the right lower lobe. 2. There are scattered bilateral small lung nodules are relatively stable compared to previous examinations. There is 1 very small 4 mm nodule right lung which is new from older examinations and simple attention on follow-up. 3. Stable prominent nodes at the right hilum. Few small mediastinal nodes are identified, one of which has slightly increased in size from the prior examination. Recommend attention on follow-up. 4. Significant calcified atherosclerotic plaque along the thoracic aorta and origin of the great vessels including possible high-grade stenosis or occlusion of the left vertebral artery origin. Of note the left  vertebral artery originates directly from the aortic arch just proximal to the left subclavian. 5. Mediastinal stranding with wall thickening along the esophagus diffusely. Please correlate for any clinical evidence of esophagitis. 6. Aortic atherosclerosis. 7. Emphysema. Aortic Atherosclerosis (ICD10-I70.0) and Emphysema (ICD10-J43.9). Electronically Signed   By: Jill Side M.D.   On: 11/13/2022 18:19    ASSESSMENT AND PLAN:  This is a very pleasant 77 years old white female with suspicious recurrent non-small cell lung cancer initially diagnosed as history of stage IA (T1 a, N0, M0) non-small cell lung cancer, squamous cell carcinoma presented with right lower lobe lung nodule diagnosed in June 2021 status post right lower lobe superior segmentectomy with lymph node dissection under the care  of Dr. Roxan Hockey on March 17, 2020.  The Amy [name] has evidence for disease recurrence in July 2023. The Amy [name] is feeling fine today with no concerning complaints except for the back pain. Amy [name] had repeat CT scan of the chest performed recently.  I personally and independently reviewed the scan images and discussed the results with the Amy [name] today. Amy [name] scan showed mild increase in the right infrahilar soft tissue encasing the bronchus intermedius and disease recurrence could not be completely excluded.  There was also new mild bilateral lower paratracheal lymphadenopathy that is nonspecific and nodal metastasis could not be completely excluded. The Amy [name] had a PET scan performed recently and that showed intensely hypermetabolic recurrence in the right infrahilar region with mildly prominent left paratracheal and paraesophageal lymph nodes also mildly hypermetabolic for size and suspicious for nodal metastasis but no distant metastatic disease. The Amy [name] underwent repeat bronchoscopy with EBUS under the care of Dr. Roxan Hockey and the final pathology was consistent with recurrent squamous cell carcinoma of the lung.  MRI of the brain was negative for malignancy. Amy [name] underwent treatment with concurrent chemoradiation with weekly carboplatin for AUC of 2 and paclitaxel 45 Mg/M2.  First dose today June 05, 2022.  Status post 6 cycles.  Amy [name] has partial response. The Amy [name] is currently undergoing consolidation treatment with immunotherapy with Imfinzi 1500 Mg IV every 4 weeks status post 3 cycles. The Amy [name] has been tolerating this treatment with immunotherapy fairly well with no concerning adverse effects. Amy [name] had repeat CT scan of the chest performed recently.  I personally and independently reviewed the scan and discussed the result with the Amy [name] and Amy [name] husband today. Amy [name] scan showed no concerning findings for disease progression but there was a tiny 0.4 cm nodule in the right lung that need  close monitoring. I recommended for the Amy [name] to continue Amy [name] current treatment with Imfinzi and Amy [name] will proceed with cycle #4 today. I will see Amy [name] back for follow-up visit in 4 weeks for evaluation before the next cycle of Amy [name] treatment. The Amy [name] was advised to call immediately if Amy [name] has any other concerning symptoms in the interval. I discussed the assessment and treatment plan with the Amy [name]. The Amy [name] was provided an opportunity to ask questions and all were answered. The Amy [name] agreed with the plan and demonstrated an understanding of the instructions.   The Amy [name] was advised to call back or seek an in-person evaluation if the symptoms worsen or if the condition fails to improve as anticipated.  I provided 30 minutes of face-to-face video visit time during this encounter, and > 50% was spent counseling as documented under my assessment & plan.  Eilleen Kempf, MD 11/14/2022 1:31 PM  Disclaimer: This note was dictated with voice recognition software. Similar sounding  words can inadvertently be transcribed and may not be corrected upon review.

## 2022-11-23 ENCOUNTER — Encounter (HOSPITAL_COMMUNITY): Payer: Self-pay | Admitting: *Deleted

## 2022-12-12 ENCOUNTER — Inpatient Hospital Stay (HOSPITAL_BASED_OUTPATIENT_CLINIC_OR_DEPARTMENT_OTHER): Payer: Medicare HMO | Admitting: Internal Medicine

## 2022-12-12 ENCOUNTER — Other Ambulatory Visit: Payer: Self-pay

## 2022-12-12 ENCOUNTER — Inpatient Hospital Stay: Payer: Medicare HMO | Attending: Internal Medicine

## 2022-12-12 ENCOUNTER — Inpatient Hospital Stay: Payer: Medicare HMO

## 2022-12-12 VITALS — BP 121/50 | HR 73 | Temp 98.9°F | Resp 24

## 2022-12-12 DIAGNOSIS — R590 Localized enlarged lymph nodes: Secondary | ICD-10-CM | POA: Diagnosis not present

## 2022-12-12 DIAGNOSIS — Z902 Acquired absence of lung [part of]: Secondary | ICD-10-CM | POA: Diagnosis not present

## 2022-12-12 DIAGNOSIS — R918 Other nonspecific abnormal finding of lung field: Secondary | ICD-10-CM | POA: Insufficient documentation

## 2022-12-12 DIAGNOSIS — Z79620 Long term (current) use of immunosuppressive biologic: Secondary | ICD-10-CM | POA: Insufficient documentation

## 2022-12-12 DIAGNOSIS — C3431 Malignant neoplasm of lower lobe, right bronchus or lung: Secondary | ICD-10-CM

## 2022-12-12 DIAGNOSIS — Z5112 Encounter for antineoplastic immunotherapy: Secondary | ICD-10-CM | POA: Insufficient documentation

## 2022-12-12 LAB — CBC WITH DIFFERENTIAL (CANCER CENTER ONLY)
Abs Immature Granulocytes: 0.02 10*3/uL (ref 0.00–0.07)
Basophils Absolute: 0.1 10*3/uL (ref 0.0–0.1)
Basophils Relative: 1 %
Eosinophils Absolute: 0.5 10*3/uL (ref 0.0–0.5)
Eosinophils Relative: 5 %
HCT: 40 % (ref 36.0–46.0)
Hemoglobin: 13.7 g/dL (ref 12.0–15.0)
Immature Granulocytes: 0 %
Lymphocytes Relative: 10 %
Lymphs Abs: 0.9 10*3/uL (ref 0.7–4.0)
MCH: 32.4 pg (ref 26.0–34.0)
MCHC: 34.3 g/dL (ref 30.0–36.0)
MCV: 94.6 fL (ref 80.0–100.0)
Monocytes Absolute: 0.7 10*3/uL (ref 0.1–1.0)
Monocytes Relative: 8 %
Neutro Abs: 6.8 10*3/uL (ref 1.7–7.7)
Neutrophils Relative %: 76 %
Platelet Count: 263 10*3/uL (ref 150–400)
RBC: 4.23 MIL/uL (ref 3.87–5.11)
RDW: 13.1 % (ref 11.5–15.5)
WBC Count: 8.9 10*3/uL (ref 4.0–10.5)
nRBC: 0 % (ref 0.0–0.2)

## 2022-12-12 LAB — CMP (CANCER CENTER ONLY)
ALT: 12 U/L (ref 0–44)
AST: 16 U/L (ref 15–41)
Albumin: 4 g/dL (ref 3.5–5.0)
Alkaline Phosphatase: 84 U/L (ref 38–126)
Anion gap: 7 (ref 5–15)
BUN: 18 mg/dL (ref 8–23)
CO2: 28 mmol/L (ref 22–32)
Calcium: 9.1 mg/dL (ref 8.9–10.3)
Chloride: 105 mmol/L (ref 98–111)
Creatinine: 1.29 mg/dL — ABNORMAL HIGH (ref 0.44–1.00)
GFR, Estimated: 42 mL/min — ABNORMAL LOW (ref >60)
Glucose, Bld: 84 mg/dL (ref 70–99)
Potassium: 3.8 mmol/L (ref 3.5–5.1)
Sodium: 140 mmol/L (ref 135–145)
Total Bilirubin: 0.5 mg/dL (ref 0.3–1.2)
Total Protein: 7.1 g/dL (ref 6.5–8.1)

## 2022-12-12 MED ORDER — SODIUM CHLORIDE 0.9 % IV SOLN: Freq: Once | INTRAVENOUS | Status: AC

## 2022-12-12 MED ORDER — SODIUM CHLORIDE 0.9 % IV SOLN
1500.0000 mg | Freq: Once | INTRAVENOUS | Status: AC
  Administered 2022-12-12: 1500 mg via INTRAVENOUS
  Filled 2022-12-12: qty 30

## 2022-12-12 NOTE — Patient Instructions (Signed)
[facility]  Discharge Instructions: Thank you for choosing [facility] to provide your oncology and hematology care.   If you have a lab appointment with the [facility], please go directly to the [facility] and check in at the registration area.   Wear comfortable clothing and clothing appropriate for easy access to any Portacath or PICC line.   We strive to give you quality time with your provider. You may need to reschedule your appointment if you arrive late (15 or more minutes).  Arriving late affects you and other patients whose appointments are after yours.  Also, if you miss three or more appointments without notifying the office, you may be dismissed from the clinic at the provider's discretion.      For prescription refill requests, have your pharmacy contact our office and allow 72 hours for refills to be completed.    Today you received the following chemotherapy and/or immunotherapy agents: Imfinzi      To help prevent nausea and vomiting after your treatment, we encourage you to take your nausea medication as directed.  BELOW ARE SYMPTOMS THAT SHOULD BE REPORTED IMMEDIATELY: *FEVER GREATER THAN 100.4 F (38 C) OR HIGHER *CHILLS OR SWEATING *NAUSEA AND VOMITING THAT IS NOT CONTROLLED WITH YOUR NAUSEA MEDICATION *UNUSUAL SHORTNESS OF BREATH *UNUSUAL BRUISING OR BLEEDING *URINARY PROBLEMS (pain or burning when urinating, or frequent urination) *BOWEL PROBLEMS (unusual diarrhea, constipation, pain near the anus) TENDERNESS IN MOUTH AND THROAT WITH OR WITHOUT PRESENCE OF ULCERS (sore throat, sores in mouth, or a toothache) UNUSUAL RASH, SWELLING OR PAIN  UNUSUAL VAGINAL DISCHARGE OR ITCHING   Items with * indicate a potential emergency and should be followed up as soon as possible or go to the Emergency Department if any problems should occur.  Please show the CHEMOTHERAPY ALERT CARD or IMMUNOTHERAPY ALERT CARD at  check-in to the Emergency Department and triage nurse.  Should you have questions after your visit or need to cancel or reschedule your appointment, please contact [facility]  Dept: [phone]  and follow the prompts.  Office hours are 8:00 a.m. to 4:30 p.m. Monday - Friday. Please note that voicemails left after 4:00 p.m. may not be returned until the following business day.  We are closed weekends and major holidays. You have access to a nurse at all times for urgent questions. Please call the main number to the clinic Dept: [phone] and follow the prompts.   For any non-urgent questions, you may also contact your provider using MyChart. We now offer e-Visits for anyone 71 and older to request care online for non-urgent symptoms. For details visit [email or web address].   Also download the MyChart app! Go to the app store, search "MyChart", open the app, select [facility], and log in with your MyChart username and password.

## 2022-12-12 NOTE — Progress Notes (Signed)
[place] [phone]   [phone]  OFFICE PROGRESS NOTE  [name], MD [place] [address]  DIAGNOSIS: Recurrent lung cancer initially diagnosed as stage IA (T1, N0, M0) non-small cell lung cancer, squamous cell carcinoma.  She presented with a right lower lobe lung nodule.  She was diagnosed in June 2021.    PRIOR THERAPY: Right lower lobe superior segmentectomy and lymph node dissection under the care of Dr. Roxan Hockey on 03/17/2020. 2) Concurrent chemoradiation with weekly carboplatin for AUC of 2 and paclitaxel 45 Mg/M2. Last dose on 07/10/22  Status post 6 cycles.   CURRENT THERAPY: Consolidation immunotherapy with Imfinzi 1500 mg IV every 4 weeks.  First dose expected on 08/22/2022.  Status post 4 cycles.  INTERVAL HISTORY: Amy [name] 77 y.o. female returns to the clinic today for follow-up visit.  The patient is feeling fine today with no concerning complaints except for mild fatigue.  She has no chest pain, shortness of breath, cough or hemoptysis.  She has no nausea, vomiting, diarrhea or constipation.  She has no headache or visual changes.  She is here today for evaluation before starting cycle #5 of her treatment.   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  Status post right lower lobe superior segmentectomy    ALLERGIES:  has No Known Allergies.  MEDICATIONS:  Current Outpatient Medications  Medication Sig Dispense Refill   acetaminophen (TYLENOL) 500 MG tablet Take 500-1,000 mg by mouth every 6 (six) hours as needed for mild pain or headache.     ADVAIR  DISKUS 250-50 MCG/ACT AEPB Inhale 1 puff into the lungs 2 (two) times daily.     albuterol (VENTOLIN HFA) 108 (90 Base) MCG/ACT inhaler Inhale 2 puffs into the lungs every 6 (six) hours as needed for wheezing or shortness of breath.     apixaban (ELIQUIS) 5 MG TABS tablet Take 1 tablet (5 mg total) by mouth 2 (two) times daily. 180 tablet 3   diltiazem (CARDIZEM LA) 240 MG 24 hr tablet Take 1 tablet (240 mg total) by mouth daily. 180 tablet 3   pantoprazole (PROTONIX) 40 MG tablet Take 1 tablet (40 mg total) by mouth 2 (two) times daily. 60 tablet 0   prochlorperazine (COMPAZINE) 10 MG tablet Take 1 tablet (10 mg total) by mouth every 6 (six) hours as needed for nausea or vomiting. 30 tablet 0   raloxifene (EVISTA) 60 MG tablet Take 60 mg by mouth daily.     sertraline (ZOLOFT) 50 MG tablet Take 50 mg by mouth daily.     simvastatin (ZOCOR) 10 MG tablet Take 10 mg by mouth at bedtime.     traZODone (DESYREL) 50 MG tablet Take 25 mg by mouth See admin instructions. Take one to two 25 mg half-tablets by mouth as needed at bedtime     No current facility-administered medications for this visit.    SURGICAL HISTORY:  Past Surgical History:  Procedure Laterality Date   APPENDECTOMY     BALLOON DILATION N/A 07/18/2022  Procedure: BALLOON DILATION;  Surgeon: Ronnette Juniper, MD;  Location: [facility];  Service: Gastroenterology;  Laterality: N/A;   BIOPSY  07/18/2022   Procedure: BIOPSY;  Surgeon: Ronnette Juniper, MD;  Location: [facility];  Service: Gastroenterology;;   ESOPHAGOGASTRODUODENOSCOPY N/A 07/18/2022   Procedure: ESOPHAGOGASTRODUODENOSCOPY (EGD);  Surgeon: Ronnette Juniper, MD;  Location: [facility];  Service: Gastroenterology;  Laterality: N/A;   EYE SURGERY     INTERCOSTAL NERVE BLOCK Right 03/17/2020   Procedure: Intercostal Nerve Block;  Surgeon: [name], MD;  Location: [facility];  Service: Thoracic;  Laterality: Right;   LUNG SURGERY Right 03/17/2020   NODE DISSECTION Right  03/17/2020   Procedure: Node Dissection;  Surgeon: [name], MD;  Location: [facility];  Service: Thoracic;  Laterality: Right;   TUBAL LIGATION     VIDEO BRONCHOSCOPY WITH ENDOBRONCHIAL ULTRASOUND N/A 05/22/2022   Procedure: VIDEO BRONCHOSCOPY WITH ENDOBRONCHIAL ULTRASOUND;  Surgeon: [name], MD;  Location: [facility];  Service: Thoracic;  Laterality: N/A;    REVIEW OF SYSTEMS:  A comprehensive review of systems was negative except for: Constitutional: positive for fatigue Respiratory: positive for dyspnea on exertion   PHYSICAL EXAMINATION: General appearance: alert, cooperative, and no distress Head: Normocephalic, without obvious abnormality, atraumatic Neck: no adenopathy, no JVD, supple, symmetrical, trachea midline, and thyroid not enlarged, symmetric, no tenderness/mass/nodules Lymph nodes: Cervical, supraclavicular, and axillary nodes normal. Resp: clear to auscultation bilaterally Back: symmetric, no curvature. ROM normal. No CVA tenderness. Cardio: regular rate and rhythm, S1, S2 normal, no murmur, click, rub or gallop GI: soft, non-tender; bowel sounds normal; no masses,  no organomegaly Extremities: extremities normal, atraumatic, no cyanosis or edema  ECOG PERFORMANCE STATUS: 1 - Symptomatic but completely ambulatory  Blood pressure (!) 141/90, pulse 96, temperature 98.2 F (36.8 C), temperature source Oral, resp. rate 16, weight 151 lb 4.8 oz (68.6 kg), SpO2 98 %.   LABORATORY DATA: Lab Results  Component Value Date   WBC 8.9 12/12/2022   HGB 13.7 12/12/2022   HCT 40.0 12/12/2022   MCV 94.6 12/12/2022   PLT 263 12/12/2022      Chemistry      Component Value Date/Time   NA 138 11/14/2022 1308   K 4.5 11/14/2022 1308   CL 104 11/14/2022 1308   CO2 28 11/14/2022 1308   BUN 17 11/14/2022 1308   CREATININE 1.20 (H) 11/14/2022 1308      Component Value Date/Time   CALCIUM 9.8 11/14/2022 1308   ALKPHOS 79 11/14/2022 1308   AST 19 11/14/2022 1308    ALT 16 11/14/2022 1308   BILITOT 0.6 11/14/2022 1308       RADIOGRAPHIC STUDIES: CT Chest W Contrast  Result Date: 11/13/2022 CLINICAL DATA:  Non-small-cell lung cancer staging. EXAM: CT CHEST WITH CONTRAST TECHNIQUE: Multidetector CT imaging of the chest was performed during intravenous contrast administration. RADIATION DOSE REDUCTION: This exam was performed according to the departmental dose-optimization program which includes automated exposure control, adjustment of the mA and/or kV according to patient size and/or use of iterative reconstruction technique. CONTRAST:  53m OMNIPAQUE IOHEXOL 300 MG/ML  SOLN COMPARISON:  CT chest 08/09/2022 and older FINDINGS: Cardiovascular: Significant calcified atherosclerotic plaque along the thoracic aorta particularly arch and descending is also significant plaque along the origin of the great vessels including possible high-grade stenosis or occlusion of the left vertebral artery origin. Significant stenosis of the left subclavian artery as well. Of note the left vertebral artery originates directly from the aortic arch just proximal  to the left subclavian. Heart is nonenlarged. Trace pericardial fluid. Coronary artery calcifications are seen. Mediastinum/Nodes: No specific abnormal lymph node enlargement identified in the axillary regions, left hilum. Stable prominent nodes at the right hilum. Example immediately anterior to the right main bronchus on series 2, image 65 measures 12 x 8 mm and is unchanged. There is also some thickening along the mediastinal fat with stranding. Few small mediastinal nodes are identified. Example immediately anterior to the descending thoracic aorta on series 2, image 86 measures 8 by 8 mm and previously 6 x 6 mm. The thoracic aorta has a normal course and caliber with wall thickening. Please correlate for any clinical evidence of esophagitis. The esophagus is also patulous. The appearance is similar to that of the prior  examination. Lungs/Pleura: The left lung demonstrates centrilobular emphysematous lung changes greatest along the upper lung zones. No left-sided pleural effusion or pneumothorax. Significant pleural thickening and nodularity at the left lung apex with some calcification. The pleural thickening is very nodular. There is a noncalcified left upper lobe nodule on image 36 of series 7 measuring 6 x 4 mm. Previously this would have measured 5 5 mm on the recent prior. This is stable going back to December 2021 CT scan. 5 mm nodule seen left lung on image 46 abutting the interlobar fissure is also stable. 4 mm left upper lobe nodule on image 53 laterally also stable. Additional small areas elsewhere along the left lung are stable. Right lung is volume loss with surgical changes along the posterior aspect of the right lung inferiorly abutting the pleura extending along areas of scarring. There are areas of bronchiectasis and bronchial wall thickening in this location. Significant wall thickening identified as well along the course of the central structures of the right lower lobe, similar to previous. Peripheral areas of scarring atelectatic changes underlying emphysematous lung changes once again advanced. Few tiny areas of nodularity are similar. There is 1 small focus of nodularity right lung on series 7, image 70 measuring 4 mm. Simple attention on follow-up. Tiny right basilar pleural effusion. Stable. Upper Abdomen: Preserved adrenal glands.  Fatty liver infiltration. Musculoskeletal: Scattered degenerative changes along the spine. IMPRESSION: 1. Overall relatively stable examination with post treatment changes involving the right lung with volume loss and areas of bronchiectasis and bronchial wall thickening along the course of the central structures of the right lower lobe. 2. There are scattered bilateral small lung nodules are relatively stable compared to previous examinations. There is 1 very small 4 mm nodule  right lung which is new from older examinations and simple attention on follow-up. 3. Stable prominent nodes at the right hilum. Few small mediastinal nodes are identified, one of which has slightly increased in size from the prior examination. Recommend attention on follow-up. 4. Significant calcified atherosclerotic plaque along the thoracic aorta and origin of the great vessels including possible high-grade stenosis or occlusion of the left vertebral artery origin. Of note the left vertebral artery originates directly from the aortic arch just proximal to the left subclavian. 5. Mediastinal stranding with wall thickening along the esophagus diffusely. Please correlate for any clinical evidence of esophagitis. 6. Aortic atherosclerosis. 7. Emphysema. Aortic Atherosclerosis (ICD10-I70.0) and Emphysema (ICD10-J43.9). Electronically Signed   By: Jill Side M.D.   On: 11/13/2022 18:19    ASSESSMENT AND PLAN: This is a very pleasant 77 years old white female with suspicious recurrent non-small cell lung cancer initially diagnosed as history of stage IA (T1 a, N0, M0)  non-small cell lung cancer, squamous cell carcinoma presented with right lower lobe lung nodule diagnosed in June 2021 status post right lower lobe superior segmentectomy with lymph node dissection under the care of Dr. Roxan Hockey on March 17, 2020.  The patient has evidence for disease recurrence in July 2023. The patient is feeling fine today with no concerning complaints except for the back pain. She had repeat CT scan of the chest performed recently.  I personally and independently reviewed the scan images and discussed the results with the patient today. Her scan showed mild increase in the right infrahilar soft tissue encasing the bronchus intermedius and disease recurrence could not be completely excluded.  There was also new mild bilateral lower paratracheal lymphadenopathy that is nonspecific and nodal metastasis could not be completely  excluded. The patient had a PET scan performed recently and that showed intensely hypermetabolic recurrence in the right infrahilar region with mildly prominent left paratracheal and paraesophageal lymph nodes also mildly hypermetabolic for size and suspicious for nodal metastasis but no distant metastatic disease. The patient underwent repeat bronchoscopy with EBUS under the care of Dr. Roxan Hockey and the final pathology was consistent with recurrent squamous cell carcinoma of the lung.  MRI of the brain was negative for malignancy. She underwent treatment with concurrent chemoradiation with weekly carboplatin for AUC of 2 and paclitaxel 45 Mg/M2.  First dose today June 05, 2022.  Status post 6 cycles.  She has partial response. The patient is currently undergoing consolidation treatment with immunotherapy with Imfinzi 1500 Mg IV every 4 weeks status post 4 cycles.   The patient has been tolerating this treatment well with no concerning adverse effects. I recommended for her to proceed with cycle #5 today as planned. She will come back for follow-up visit in 4 weeks for evaluation before starting cycle #6. The patient was advised to call immediately if she has any other concerning symptoms in the interval. The patient voices understanding of current disease status and treatment options and is in agreement with the current care plan.  All questions were answered. The patient knows to call the clinic with any problems, questions or concerns. We can certainly see the patient much sooner if necessary. The total time spent in the appointment was 20 minutes.   Disclaimer: This note was dictated with voice recognition software. Similar sounding words can inadvertently be transcribed and may not be corrected upon review.

## 2022-12-12 NOTE — Progress Notes (Signed)
Patient seen by MD today  Vitals are within treatment parameters.  Labs reviewed: Labs reviewed and are within treatment parameters. Potassium rx sent to pts pharmacy.  Per physician team, patient is ready for treatment and there are NO modifications to the treatment plan.

## 2022-12-12 NOTE — Progress Notes (Signed)
Patient seen by MD today  Vitals are within treatment parameters.  Labs reviewed: and are within treatment parameters.  Per physician team, patient is ready for treatment and there are NO modifications to the treatment plan.

## 2023-01-05 NOTE — Progress Notes (Signed)
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  She presented with a right lower lobe lung nodule.  She was diagnosed in June 2021.    PDL1 Expression 1%  PRIOR THERAPY: 1)  Right lower lobe superior segmentectomy and lymph node dissection under the care of Dr. Roxan Hockey on 03/17/2020. 2) Concurrent chemoradiation with weekly carboplatin for AUC of 2 and paclitaxel 45 Mg/M2. Last dose on 07/10/22  Status post 6 cycles.  CURRENT THERAPY: Consolidation immunotherapy with Imfinzi 1500 mg IV every 4 weeks.  First dose expected on 08/22/2022.  Status post 4 cycles.   INTERVAL HISTORY: Amy [name] 77 y.o. female returns to the clinic for a follow up visit. The patient is feeling well today without any concerning complaints. The patient continues to tolerate treatment with immunotherapy with Imfinzi well without any adverse effects. Denies any fever, chills, night sweats, or weight loss. Denies any chest pain, shortness of breath, cough, or hemoptysis. Denies any nausea, vomiting, diarrhea, or constipation. Denies any headache or visual changes. Denies any rashes or skin changes. The patient is here today for evaluation prior to starting cycle # 6.   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  Status post right lower lobe superior segmentectomy    ALLERGIES:  has No Known Allergies.  MEDICATIONS:  Current Outpatient Medications  Medication Sig Dispense Refill    acetaminophen (TYLENOL) 500 MG tablet Take 500-1,000 mg by mouth every 6 (six) hours as needed for mild pain or headache.     ADVAIR DISKUS 250-50 MCG/ACT AEPB Inhale 1 puff into the lungs 2 (two) times daily.     albuterol (VENTOLIN HFA) 108 (90 Base) MCG/ACT inhaler Inhale 2 puffs into the lungs every 6 (six) hours as needed for wheezing or shortness of breath.     apixaban (ELIQUIS) 5 MG TABS tablet Take 1 tablet (5 mg total) by mouth 2 (two) times daily. 180 tablet 3   diltiazem (CARDIZEM LA) 240 MG 24 hr tablet Take 1 tablet (240 mg total) by mouth daily. 180 tablet 3   pantoprazole (PROTONIX) 40 MG tablet Take 1 tablet (40 mg total) by mouth 2 (two) times daily. 60 tablet 0   prochlorperazine (COMPAZINE) 10 MG tablet Take 1 tablet (10 mg total) by mouth every 6 (six) hours as needed for nausea or vomiting. 30 tablet 0   raloxifene (EVISTA) 60 MG tablet Take 60 mg by mouth daily.     sertraline (ZOLOFT) 50 MG tablet Take 50 mg by mouth daily.     simvastatin (ZOCOR) 10 MG tablet Take 10 mg by mouth at bedtime.     traZODone (DESYREL) 50 MG tablet Take 25 mg by mouth See admin instructions. Take one to two 25 mg half-tablets by mouth as needed at bedtime     No current facility-administered medications for this visit.    SURGICAL HISTORY:  Past Surgical History:  Procedure  Laterality Date   APPENDECTOMY     BALLOON DILATION N/A 07/18/2022   Procedure: BALLOON DILATION;  Surgeon: Ronnette Juniper, MD;  Location: [facility];  Service: Gastroenterology;  Laterality: N/A;   BIOPSY  07/18/2022   Procedure: BIOPSY;  Surgeon: Ronnette Juniper, MD;  Location: [facility];  Service: Gastroenterology;;   ESOPHAGOGASTRODUODENOSCOPY N/A 07/18/2022   Procedure: ESOPHAGOGASTRODUODENOSCOPY (EGD);  Surgeon: Ronnette Juniper, MD;  Location: [facility];  Service: Gastroenterology;  Laterality: N/A;   EYE SURGERY     INTERCOSTAL NERVE BLOCK Right 03/17/2020   Procedure: Intercostal Nerve Block;  Surgeon: [name], MD;  Location: [facility];  Service: Thoracic;  Laterality: Right;   LUNG SURGERY Right 03/17/2020   NODE DISSECTION Right 03/17/2020   Procedure: Node Dissection;  Surgeon: [name], MD;  Location: [facility];  Service: Thoracic;  Laterality: Right;   TUBAL LIGATION     VIDEO BRONCHOSCOPY WITH ENDOBRONCHIAL ULTRASOUND N/A 05/22/2022   Procedure: VIDEO BRONCHOSCOPY WITH ENDOBRONCHIAL ULTRASOUND;  Surgeon: [name], MD;  Location: [facility];  Service: Thoracic;  Laterality: N/A;    REVIEW OF SYSTEMS:   Review of Systems  Constitutional: Negative for appetite change, chills, fatigue, fever and unexpected weight change.  HENT:   Negative for mouth sores, nosebleeds, sore throat and trouble swallowing.   Eyes: Negative for eye problems and icterus.  Respiratory: Negative for cough, hemoptysis, shortness of breath and wheezing.   Cardiovascular: Negative for chest pain and leg swelling.  Gastrointestinal: Negative for abdominal pain, constipation, diarrhea, nausea and vomiting.  Genitourinary: Negative for bladder incontinence, difficulty urinating, dysuria, frequency and hematuria.   Musculoskeletal: Negative for back pain, gait problem, neck pain and neck stiffness.  Skin: Negative for itching and rash.  Neurological: Negative for dizziness, extremity weakness, gait problem, headaches, light-headedness and seizures.  Hematological: Negative for adenopathy. Does not bruise/bleed easily.  Psychiatric/Behavioral: Negative for confusion, depression and sleep disturbance. The patient is not nervous/anxious.     PHYSICAL EXAMINATION:  Blood pressure (!) 97/55, pulse (!) 102, temperature 98.2 F (36.8 C), temperature source Oral, height 5\' 3"  (1.6 m), weight 151 lb 1.6 oz (68.5 kg), SpO2 97 %.  ECOG PERFORMANCE STATUS: 1  Physical Exam  Constitutional: Oriented to person, place, and time and well-developed, well-nourished, and in no distress.  HENT:  Head: Normocephalic  and atraumatic.  Mouth/Throat: Oropharynx is clear and moist. No oropharyngeal exudate.  Eyes: Conjunctivae are normal. Right eye exhibits no discharge. Left eye exhibits no discharge. No scleral icterus.  Neck: Normal range of motion. Neck supple.  Cardiovascular: Normal rate, regular rhythm, normal heart sounds and intact distal pulses.   Pulmonary/Chest: Effort normal and breath sounds normal. No respiratory distress. No wheezes. No rales.  Abdominal: Soft. Bowel sounds are normal. Exhibits no distension and no mass. There is no tenderness.  Musculoskeletal: Normal range of motion. Exhibits no edema.  Lymphadenopathy:    No cervical adenopathy.  Neurological: Alert and oriented to person, place, and time. Exhibits normal muscle tone. Gait normal. Coordination normal.  Skin: Skin is warm and dry. No rash noted. Not diaphoretic. No erythema. No pallor.  Psychiatric: Mood, memory and judgment normal.  Vitals reviewed.  LABORATORY DATA: Lab Results  Component Value Date   WBC 9.3 01/09/2023   HGB 13.2 01/09/2023   HCT 39.7 01/09/2023   MCV 93.9 01/09/2023   PLT 267 01/09/2023      Chemistry      Component Value Date/Time   NA 139 01/09/2023 0952  K 4.4 01/09/2023 0952   CL 103 01/09/2023 0952   CO2 28 01/09/2023 0952   BUN 18 01/09/2023 0952   CREATININE 1.32 (H) 01/09/2023 0952      Component Value Date/Time   CALCIUM 9.7 01/09/2023 0952   ALKPHOS 87 01/09/2023 0952   AST 17 01/09/2023 0952   ALT 12 01/09/2023 0952   BILITOT 0.6 01/09/2023 0952       RADIOGRAPHIC STUDIES:  No results found.   ASSESSMENT/PLAN:  This is a very pleasant 77 year old Caucasian female initially diagnosed as a stage Ia (T1 a, N0, M0) non-small cell lung cancer, squamous cell carcinoma.  The patient initially presented with a right lower lobe lung nodule in June 2021.  She is status post right lower lobe superior segmentectomy with lymph node dissection under the care of Dr. Roxan Hockey on  6/2/thousand 21.  The patient had evidence of disease recurrence in July 2023.   She had new right infrahilar soft tissue lesion encasing the bronchus intermedius and disease recurrence cannot be completely excluded.  There is also new mild bilateral lower paratracheal lymphadenopathy that is nonspecific and nodal metastatic disease cannot be excluded.  She had a PET scan that showed intensely hypermetabolic recurrence in the right infrahilar region with mild prominent left paratracheal and paraesophageal lymph nodes also mildly hypermetabolic for size and suspicious for nodal metastasis but there is no evidence of distant metastatic disease.   The patient had a repeat bronchoscopy and EBUS under the care of Dr. Roxan Hockey and the final pathology was consistent with recurrent squamous cell carcinoma.  Therefore, she then underwent concurrent chemoradiation with carboplatin for an AC of 2 and paclitaxel 45 mg/m.  He is status post 6 cycles of treatment.  She is currently on consolidation immunotherapy with Imfinzi 1500 mg IV every 4 weeks.  She status post 5 cycles.  She has been tolerating this well without any concerning adverse side effects.  Labs were reviewed.  Recommend that she proceed with cycle #6 today scheduled.  I will arrange for restaging CT scan of the chest prior to her next cycle of treatment.   Her BP is low today. This is new for her. She denies any dehydration. She states she has light color urine. She does not take any BP meds except for Cardizem. We will recheck this in the infusion room. If this is still low, we will arrange for additional IVF with 1 L over 2 hours. She also was instructed to hydrate well at home.   The patient was advised to call immediately if she has any concerning symptoms in the interval. The patient voices understanding of current disease status and treatment options and is in agreement with the current care plan. All questions were answered. The  patient knows to call the clinic with any problems, questions or concerns. We can certainly see the patient much sooner if necessary      Orders Placed This Encounter  Procedures   CT Chest W Contrast    Standing Status:   Future    Standing Expiration Date:   01/09/2024    Order Specific Question:   If indicated for the ordered procedure, I authorize the administration of contrast media per Radiology protocol    Answer:   Yes    Order Specific Question:   Does the patient have a contrast media/X-ray dye allergy?    Answer:   No    Order Specific Question:   Preferred imaging location?    Answer:  [facility]     The total time spent in the appointment was 20-29 minutes.   Maliah Pyles L Tersa Fotopoulos, PA-C 01/09/23

## 2023-01-09 ENCOUNTER — Other Ambulatory Visit: Payer: Self-pay

## 2023-01-09 ENCOUNTER — Inpatient Hospital Stay: Payer: Medicare HMO

## 2023-01-09 ENCOUNTER — Inpatient Hospital Stay (HOSPITAL_BASED_OUTPATIENT_CLINIC_OR_DEPARTMENT_OTHER): Payer: Medicare HMO | Admitting: Physician Assistant

## 2023-01-09 ENCOUNTER — Inpatient Hospital Stay: Payer: Medicare HMO | Attending: Internal Medicine

## 2023-01-09 VITALS — BP 97/55 | HR 102 | Temp 98.2°F | Ht 63.0 in | Wt 151.1 lb

## 2023-01-09 VITALS — BP 120/61 | HR 87 | Resp 19

## 2023-01-09 DIAGNOSIS — Z7901 Long term (current) use of anticoagulants: Secondary | ICD-10-CM | POA: Diagnosis not present

## 2023-01-09 DIAGNOSIS — N189 Chronic kidney disease, unspecified: Secondary | ICD-10-CM | POA: Diagnosis not present

## 2023-01-09 DIAGNOSIS — Z79899 Other long term (current) drug therapy: Secondary | ICD-10-CM | POA: Insufficient documentation

## 2023-01-09 DIAGNOSIS — I129 Hypertensive chronic kidney disease with stage 1 through stage 4 chronic kidney disease, or unspecified chronic kidney disease: Secondary | ICD-10-CM | POA: Diagnosis not present

## 2023-01-09 DIAGNOSIS — Z5112 Encounter for antineoplastic immunotherapy: Secondary | ICD-10-CM

## 2023-01-09 DIAGNOSIS — Z7951 Long term (current) use of inhaled steroids: Secondary | ICD-10-CM | POA: Diagnosis not present

## 2023-01-09 DIAGNOSIS — C3431 Malignant neoplasm of lower lobe, right bronchus or lung: Secondary | ICD-10-CM

## 2023-01-09 DIAGNOSIS — J449 Chronic obstructive pulmonary disease, unspecified: Secondary | ICD-10-CM | POA: Insufficient documentation

## 2023-01-09 LAB — CBC WITH DIFFERENTIAL (CANCER CENTER ONLY)
Abs Immature Granulocytes: 0.03 10*3/uL (ref 0.00–0.07)
Basophils Absolute: 0.1 10*3/uL (ref 0.0–0.1)
Basophils Relative: 1 %
Eosinophils Absolute: 0.4 10*3/uL (ref 0.0–0.5)
Eosinophils Relative: 4 %
HCT: 39.7 % (ref 36.0–46.0)
Hemoglobin: 13.2 g/dL (ref 12.0–15.0)
Immature Granulocytes: 0 %
Lymphocytes Relative: 8 %
Lymphs Abs: 0.8 10*3/uL (ref 0.7–4.0)
MCH: 31.2 pg (ref 26.0–34.0)
MCHC: 33.2 g/dL (ref 30.0–36.0)
MCV: 93.9 fL (ref 80.0–100.0)
Monocytes Absolute: 0.7 10*3/uL (ref 0.1–1.0)
Monocytes Relative: 7 %
Neutro Abs: 7.3 10*3/uL (ref 1.7–7.7)
Neutrophils Relative %: 80 %
Platelet Count: 267 10*3/uL (ref 150–400)
RBC: 4.23 MIL/uL (ref 3.87–5.11)
RDW: 13 % (ref 11.5–15.5)
WBC Count: 9.3 10*3/uL (ref 4.0–10.5)
nRBC: 0 % (ref 0.0–0.2)

## 2023-01-09 LAB — CMP (CANCER CENTER ONLY)
ALT: 12 U/L (ref 0–44)
AST: 17 U/L (ref 15–41)
Albumin: 4 g/dL (ref 3.5–5.0)
Alkaline Phosphatase: 87 U/L (ref 38–126)
Anion gap: 8 (ref 5–15)
BUN: 18 mg/dL (ref 8–23)
CO2: 28 mmol/L (ref 22–32)
Calcium: 9.7 mg/dL (ref 8.9–10.3)
Chloride: 103 mmol/L (ref 98–111)
Creatinine: 1.32 mg/dL — ABNORMAL HIGH (ref 0.44–1.00)
GFR, Estimated: 41 mL/min — ABNORMAL LOW
Glucose, Bld: 97 mg/dL (ref 70–99)
Potassium: 4.4 mmol/L (ref 3.5–5.1)
Sodium: 139 mmol/L (ref 135–145)
Total Bilirubin: 0.6 mg/dL (ref 0.3–1.2)
Total Protein: 6.8 g/dL (ref 6.5–8.1)

## 2023-01-09 LAB — TSH: TSH: 3.132 u[IU]/mL (ref 0.350–4.500)

## 2023-01-09 MED ORDER — SODIUM CHLORIDE 0.9 % IV SOLN
1500.0000 mg | Freq: Once | INTRAVENOUS | Status: AC
  Administered 2023-01-09: 1500 mg via INTRAVENOUS
  Filled 2023-01-09: qty 30

## 2023-01-09 MED ORDER — SODIUM CHLORIDE 0.9 % IV SOLN: Freq: Once | INTRAVENOUS | Status: AC

## 2023-01-09 NOTE — Patient Instructions (Signed)
[facility]  Discharge Instructions: Thank you for choosing [facility] to provide your oncology and hematology care.   If you have a lab appointment with the Cancer Center, please go directly to the Cancer Center and check in at the registration area.   Wear comfortable clothing and clothing appropriate for easy access to any Portacath or PICC line.   We strive to give you quality time with your provider. You may need to reschedule your appointment if you arrive late (15 or more minutes).  Arriving late affects you and other patients whose appointments are after yours.  Also, if you miss three or more appointments without notifying the office, you may be dismissed from the clinic at the provider's discretion.      For prescription refill requests, have your pharmacy contact our office and allow 72 hours for refills to be completed.    Today you received the following chemotherapy and/or immunotherapy agents: Durvalumab      To help prevent nausea and vomiting after your treatment, we encourage you to take your nausea medication as directed.  BELOW ARE SYMPTOMS THAT SHOULD BE REPORTED IMMEDIATELY: *FEVER GREATER THAN 100.4 F (38 C) OR HIGHER *CHILLS OR SWEATING *NAUSEA AND VOMITING THAT IS NOT CONTROLLED WITH YOUR NAUSEA MEDICATION *UNUSUAL SHORTNESS OF BREATH *UNUSUAL BRUISING OR BLEEDING *URINARY PROBLEMS (pain or burning when urinating, or frequent urination) *BOWEL PROBLEMS (unusual diarrhea, constipation, pain near the anus) TENDERNESS IN MOUTH AND THROAT WITH OR WITHOUT PRESENCE OF ULCERS (sore throat, sores in mouth, or a toothache) UNUSUAL RASH, SWELLING OR PAIN  UNUSUAL VAGINAL DISCHARGE OR ITCHING   Items with * indicate a potential emergency and should be followed up as soon as possible or go to the Emergency Department if any problems should occur.  Please show the CHEMOTHERAPY ALERT CARD or IMMUNOTHERAPY ALERT CARD at  check-in to the Emergency Department and triage nurse.  Should you have questions after your visit or need to cancel or reschedule your appointment, please contact [facility]  Dept: [phone]  and follow the prompts.  Office hours are 8:00 a.m. to 4:30 p.m. Monday - Friday. Please note that voicemails left after 4:00 p.m. may not be returned until the following business day.  We are closed weekends and major holidays. You have access to a nurse at all times for urgent questions. Please call the main number to the clinic Dept: [phone] and follow the prompts.   For any non-urgent questions, you may also contact your provider using MyChart. We now offer e-Visits for anyone 18 and older to request care online for non-urgent symptoms. For details visit [email or web address].   Also download the MyChart app! Go to the app store, search "MyChart", open the app, select [facility], and log in with your MyChart username and password.   

## 2023-01-18 DIAGNOSIS — I10 Essential (primary) hypertension: Secondary | ICD-10-CM | POA: Diagnosis not present

## 2023-01-18 DIAGNOSIS — D8481 Immunodeficiency due to conditions classified elsewhere: Secondary | ICD-10-CM | POA: Diagnosis not present

## 2023-01-18 DIAGNOSIS — N390 Urinary tract infection, site not specified: Secondary | ICD-10-CM | POA: Diagnosis not present

## 2023-01-18 DIAGNOSIS — Z0000 Encounter for general adult medical examination without abnormal findings: Secondary | ICD-10-CM | POA: Diagnosis not present

## 2023-01-18 DIAGNOSIS — I480 Paroxysmal atrial fibrillation: Secondary | ICD-10-CM | POA: Diagnosis not present

## 2023-01-18 DIAGNOSIS — E7800 Pure hypercholesterolemia, unspecified: Secondary | ICD-10-CM | POA: Diagnosis not present

## 2023-01-18 DIAGNOSIS — M810 Age-related osteoporosis without current pathological fracture: Secondary | ICD-10-CM | POA: Diagnosis not present

## 2023-01-18 DIAGNOSIS — N183 Chronic kidney disease, stage 3 unspecified: Secondary | ICD-10-CM | POA: Diagnosis not present

## 2023-01-18 DIAGNOSIS — D6869 Other thrombophilia: Secondary | ICD-10-CM | POA: Diagnosis not present

## 2023-01-18 DIAGNOSIS — F324 Major depressive disorder, single episode, in partial remission: Secondary | ICD-10-CM | POA: Diagnosis not present

## 2023-01-18 DIAGNOSIS — I700 Atherosclerosis of aorta: Secondary | ICD-10-CM | POA: Diagnosis not present

## 2023-01-18 DIAGNOSIS — C3490 Malignant neoplasm of unspecified part of unspecified bronchus or lung: Secondary | ICD-10-CM | POA: Diagnosis not present

## 2023-01-18 DIAGNOSIS — J449 Chronic obstructive pulmonary disease, unspecified: Secondary | ICD-10-CM | POA: Diagnosis not present

## 2023-02-02 ENCOUNTER — Encounter (HOSPITAL_COMMUNITY): Payer: Self-pay

## 2023-02-02 ENCOUNTER — Ambulatory Visit (HOSPITAL_COMMUNITY)
Admission: RE | Admit: 2023-02-02 | Discharge: 2023-02-02 | Disposition: A | Discharge status: Home / Self Care | Payer: Medicare HMO | Source: Ambulatory Visit | Attending: Physician Assistant | Admitting: Physician Assistant

## 2023-02-02 DIAGNOSIS — C3431 Malignant neoplasm of lower lobe, right bronchus or lung: Secondary | ICD-10-CM | POA: Diagnosis not present

## 2023-02-02 DIAGNOSIS — C3490 Malignant neoplasm of unspecified part of unspecified bronchus or lung: Secondary | ICD-10-CM | POA: Diagnosis not present

## 2023-02-02 DIAGNOSIS — J439 Emphysema, unspecified: Secondary | ICD-10-CM | POA: Diagnosis not present

## 2023-02-02 DIAGNOSIS — J90 Pleural effusion, not elsewhere classified: Secondary | ICD-10-CM | POA: Diagnosis not present

## 2023-02-02 MED ORDER — SODIUM CHLORIDE (PF) 0.9 % IJ SOLN
INTRAMUSCULAR | Status: AC
  Filled 2023-02-02: qty 50

## 2023-02-02 MED ORDER — IOHEXOL 300 MG/ML  SOLN
60.0000 mL | Freq: Once | INTRAMUSCULAR | Status: AC | PRN
  Administered 2023-02-02: 60 mL via INTRAVENOUS

## 2023-02-06 ENCOUNTER — Inpatient Hospital Stay (HOSPITAL_BASED_OUTPATIENT_CLINIC_OR_DEPARTMENT_OTHER): Payer: Medicare HMO | Admitting: Internal Medicine

## 2023-02-06 ENCOUNTER — Inpatient Hospital Stay: Payer: Medicare HMO | Attending: Internal Medicine

## 2023-02-06 ENCOUNTER — Encounter: Payer: Self-pay | Admitting: Medical Oncology

## 2023-02-06 ENCOUNTER — Inpatient Hospital Stay: Payer: Medicare HMO

## 2023-02-06 VITALS — BP 123/76 | HR 89 | Temp 98.4°F | Resp 20 | Wt 152.0 lb

## 2023-02-06 DIAGNOSIS — Z923 Personal history of irradiation: Secondary | ICD-10-CM | POA: Insufficient documentation

## 2023-02-06 DIAGNOSIS — Z5112 Encounter for antineoplastic immunotherapy: Secondary | ICD-10-CM | POA: Diagnosis not present

## 2023-02-06 DIAGNOSIS — Z79620 Long term (current) use of immunosuppressive biologic: Secondary | ICD-10-CM | POA: Diagnosis not present

## 2023-02-06 DIAGNOSIS — C3431 Malignant neoplasm of lower lobe, right bronchus or lung: Secondary | ICD-10-CM

## 2023-02-06 DIAGNOSIS — Z9221 Personal history of antineoplastic chemotherapy: Secondary | ICD-10-CM | POA: Diagnosis not present

## 2023-02-06 DIAGNOSIS — Z902 Acquired absence of lung [part of]: Secondary | ICD-10-CM | POA: Insufficient documentation

## 2023-02-06 LAB — CBC WITH DIFFERENTIAL (CANCER CENTER ONLY)
Abs Immature Granulocytes: 0.03 10*3/uL (ref 0.00–0.07)
Basophils Absolute: 0 10*3/uL (ref 0.0–0.1)
Basophils Relative: 1 %
Eosinophils Absolute: 0.4 10*3/uL (ref 0.0–0.5)
Eosinophils Relative: 6 %
HCT: 40.7 % (ref 36.0–46.0)
Hemoglobin: 13.3 g/dL (ref 12.0–15.0)
Immature Granulocytes: 0 %
Lymphocytes Relative: 14 %
Lymphs Abs: 1.2 10*3/uL (ref 0.7–4.0)
MCH: 30.9 pg (ref 26.0–34.0)
MCHC: 32.7 g/dL (ref 30.0–36.0)
MCV: 94.7 fL (ref 80.0–100.0)
Monocytes Absolute: 0.7 10*3/uL (ref 0.1–1.0)
Monocytes Relative: 8 %
Neutro Abs: 5.7 10*3/uL (ref 1.7–7.7)
Neutrophils Relative %: 71 %
Platelet Count: 242 10*3/uL (ref 150–400)
RBC: 4.3 MIL/uL (ref 3.87–5.11)
RDW: 13.2 % (ref 11.5–15.5)
WBC Count: 8 10*3/uL (ref 4.0–10.5)
nRBC: 0 % (ref 0.0–0.2)

## 2023-02-06 LAB — CMP (CANCER CENTER ONLY)
ALT: 14 U/L (ref 0–44)
AST: 18 U/L (ref 15–41)
Albumin: 4 g/dL (ref 3.5–5.0)
Alkaline Phosphatase: 82 U/L (ref 38–126)
Anion gap: 8 (ref 5–15)
BUN: 19 mg/dL (ref 8–23)
CO2: 29 mmol/L (ref 22–32)
Calcium: 9.8 mg/dL (ref 8.9–10.3)
Chloride: 103 mmol/L (ref 98–111)
Creatinine: 1.33 mg/dL — ABNORMAL HIGH (ref 0.44–1.00)
GFR, Estimated: 41 mL/min — ABNORMAL LOW (ref >60)
Glucose, Bld: 100 mg/dL — ABNORMAL HIGH (ref 70–99)
Potassium: 4.1 mmol/L (ref 3.5–5.1)
Sodium: 140 mmol/L (ref 135–145)
Total Bilirubin: 0.7 mg/dL (ref 0.3–1.2)
Total Protein: 7.1 g/dL (ref 6.5–8.1)

## 2023-02-06 MED ORDER — SODIUM CHLORIDE 0.9 % IV SOLN: Freq: Once | INTRAVENOUS | Status: AC

## 2023-02-06 MED ORDER — SODIUM CHLORIDE 0.9 % IV SOLN
1500.0000 mg | Freq: Once | INTRAVENOUS | Status: AC
  Administered 2023-02-06: 1500 mg via INTRAVENOUS
  Filled 2023-02-06: qty 30

## 2023-02-06 NOTE — Progress Notes (Signed)
Patient seen by Dr. Gypsy Balsam are NOT within treatment parameters.Per Dr. Arbutus Ped , it is ok to treat pt today with Durvalumab and heart rate of 108.  Labs reviewed: and are within treatment parameters.  Per physician team, patient is ready for treatment and there are NO modifications to the treatment plan.

## 2023-02-06 NOTE — Patient Instructions (Signed)
[facility]  Discharge Instructions: Thank you for choosing [facility] to provide your oncology and hematology care.   If you have a lab appointment with the Cancer Center, please go directly to the Cancer Center and check in at the registration area.   Wear comfortable clothing and clothing appropriate for easy access to any Portacath or PICC line.   We strive to give you quality time with your provider. You may need to reschedule your appointment if you arrive late (15 or more minutes).  Arriving late affects you and other patients whose appointments are after yours.  Also, if you miss three or more appointments without notifying the office, you may be dismissed from the clinic at the provider's discretion.      For prescription refill requests, have your pharmacy contact our office and allow 72 hours for refills to be completed.    Today you received the following chemotherapy and/or immunotherapy agent: Durvalumab (Imfinzi)   To help prevent nausea and vomiting after your treatment, we encourage you to take your nausea medication as directed.  BELOW ARE SYMPTOMS THAT SHOULD BE REPORTED IMMEDIATELY: *FEVER GREATER THAN 100.4 F (38 C) OR HIGHER *CHILLS OR SWEATING *NAUSEA AND VOMITING THAT IS NOT CONTROLLED WITH YOUR NAUSEA MEDICATION *UNUSUAL SHORTNESS OF BREATH *UNUSUAL BRUISING OR BLEEDING *URINARY PROBLEMS (pain or burning when urinating, or frequent urination) *BOWEL PROBLEMS (unusual diarrhea, constipation, pain near the anus) TENDERNESS IN MOUTH AND THROAT WITH OR WITHOUT PRESENCE OF ULCERS (sore throat, sores in mouth, or a toothache) UNUSUAL RASH, SWELLING OR PAIN  UNUSUAL VAGINAL DISCHARGE OR ITCHING   Items with * indicate a potential emergency and should be followed up as soon as possible or go to the Emergency Department if any problems should occur.  Please show the CHEMOTHERAPY ALERT CARD or IMMUNOTHERAPY ALERT CARD  at check-in to the Emergency Department and triage nurse.  Should you have questions after your visit or need to cancel or reschedule your appointment, please contact [facility]  Dept: [phone]  and follow the prompts.  Office hours are 8:00 a.m. to 4:30 p.m. Monday - Friday. Please note that voicemails left after 4:00 p.m. may not be returned until the following business day.  We are closed weekends and major holidays. You have access to a nurse at all times for urgent questions. Please call the main number to the clinic Dept: [phone] and follow the prompts.   For any non-urgent questions, you may also contact your provider using MyChart. We now offer e-Visits for anyone 18 and older to request care online for non-urgent symptoms. For details visit [email or web address].   Also download the MyChart app! Go to the app store, search "MyChart", open the app, select [facility], and log in with your MyChart username and password.  Durvalumab Injection What is this medication? DURVALUMAB (dur VAL ue mab) treats some types of cancer. It works by helping your immune system slow or stop the spread of cancer cells. It is a monoclonal antibody. This medicine may be used for other purposes; ask your health care provider or pharmacist if you have questions. COMMON BRAND NAME(S): IMFINZI What should I tell my care team before I take this medication? They need to know if you have any of these conditions: Allogeneic stem cell transplant (uses someone else's stem cells) Autoimmune diseases, such as Crohn disease, ulcerative colitis, lupus History of chest radiation Nervous system problems, such   as Guillain-Barre syndrome, myasthenia gravis Organ transplant An unusual or allergic reaction to durvalumab, other medications, foods, dyes, or preservatives Pregnant or trying to get pregnant Breast-feeding How should I use this medication? This medication is  infused into a vein. It is given by your care team in a hospital or clinic setting. A special MedGuide will be given to you before each treatment. Be sure to read this information carefully each time. Talk to your care team about the use of this medication in children. Special care may be needed. Overdosage: If you think you have taken too much of this medicine contact a poison control center or emergency room at once. NOTE: This medicine is only for you. Do not share this medicine with others. What if I miss a dose? Keep appointments for follow-up doses. It is important not to miss your dose. Call your care team if you are unable to keep an appointment. What may interact with this medication? Interactions have not been studied. This list may not describe all possible interactions. Give your health care provider a list of all the medicines, herbs, non-prescription drugs, or dietary supplements you use. Also tell them if you smoke, drink alcohol, or use illegal drugs. Some items may interact with your medicine. What should I watch for while using this medication? Your condition will be monitored carefully while you are receiving this medication. You may need blood work while taking this medication. This medication may cause serious skin reactions. They can happen weeks to months after starting the medication. Contact your care team right away if you notice fevers or flu-like symptoms with a rash. The rash may be red or purple and then turn into blisters or peeling of the skin. You may also notice a red rash with swelling of the face, lips, or lymph nodes in your neck or under your arms. Tell your care team right away if you have any change in your eyesight. Talk to your care team if you may be pregnant. Serious birth defects can occur if you take this medication during pregnancy and for 3 months after the last dose. You will need a negative pregnancy test before starting this medication. Contraception  is recommended while taking this medication and for 3 months after the last dose. Your care team can help you find the option that works for you. Do not breastfeed while taking this medication and for 3 months after the last dose. What side effects may I notice from receiving this medication? Side effects that you should report to your care team as soon as possible: Allergic reactions--skin rash, itching, hives, swelling of the face, lips, tongue, or throat Dry cough, shortness of breath or trouble breathing Eye pain, redness, irritation, or discharge with blurry or decreased vision Heart muscle inflammation--unusual weakness or fatigue, shortness of breath, chest pain, fast or irregular heartbeat, dizziness, swelling of the ankles, feet, or hands Hormone gland problems--headache, sensitivity to light, unusual weakness or fatigue, dizziness, fast or irregular heartbeat, increased sensitivity to cold or heat, excessive sweating, constipation, hair loss, increased thirst or amount of urine, tremors or shaking, irritability Infusion reactions--chest pain, shortness of breath or trouble breathing, feeling faint or lightheaded Kidney injury (glomerulonephritis)--decrease in the amount of urine, red or dark brown urine, foamy or bubbly urine, swelling of the ankles, hands, or feet Liver injury--right upper belly pain, loss of appetite, nausea, light-colored stool, dark yellow or brown urine, yellowing skin or eyes, unusual weakness or fatigue Pain, tingling, or numbness in the   hands or feet, muscle weakness, change in vision, confusion or trouble speaking, loss of balance or coordination, trouble walking, seizures Rash, fever, and swollen lymph nodes Redness, blistering, peeling, or loosening of the skin, including inside the mouth Sudden or severe stomach pain, bloody diarrhea, fever, nausea, vomiting Side effects that usually do not require medical attention (report these to your care team if they  continue or are bothersome): Bone, joint, or muscle pain Diarrhea Fatigue Loss of appetite Nausea Skin rash This list may not describe all possible side effects. Call your doctor for medical advice about side effects. You may report side effects to FDA at 1-800-FDA-1088. Where should I keep my medication? This medication is given in a hospital or clinic. It will not be stored at home. NOTE: This sheet is a summary. It may not cover all possible information. If you have questions about this medicine, talk to your doctor, pharmacist, or health care provider.  2023 Elsevier/Gold Standard (2022-02-03 00:00:00)   

## 2023-02-06 NOTE — Progress Notes (Signed)
[facility] [phone]   Fax:([phone]  OFFICE PROGRESS NOTE  Amy Blamer, MD [address] [place] [address]  DIAGNOSIS: Recurrent lung cancer initially diagnosed as stage IA (T1, N0, M0) non-small cell lung cancer, squamous cell carcinoma.  She presented with a right lower lobe lung nodule.  She was diagnosed in June 2021.    PRIOR THERAPY: Right lower lobe superior segmentectomy and lymph node dissection under the care of Dr. Dorris Fetch on 03/17/2020. 2) Concurrent chemoradiation with weekly carboplatin for AUC of 2 and paclitaxel 45 Mg/M2. Last dose on 07/10/22  Status post 6 cycles.   CURRENT THERAPY: Consolidation immunotherapy with Imfinzi 1500 mg IV every 4 weeks.  First dose expected on 08/22/2022.  Status post 6 cycles.  INTERVAL HISTORY: Amy [name] 77 y.o. female returns to the clinic today for follow-up visit.  The patient is feeling fine today with no concerning complaints.  She denied having any chest pain, shortness of breath, cough or hemoptysis.  She has no nausea, vomiting, diarrhea or constipation.  She has no headache or visual changes.  She denied having any recent weight loss or night sweats.  She continues to have mild fatigue.  She has been tolerating her treatment with immunotherapy fairly well.  She is here for evaluation with repeat CT scan of the chest before starting cycle #7.  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  Status post right lower lobe superior segmentectomy    ALLERGIES:  has No Known Allergies.  MEDICATIONS:  Current Outpatient Medications  Medication Sig  Dispense Refill   acetaminophen (TYLENOL) 500 MG tablet Take 500-1,000 mg by mouth every 6 (six) hours as needed for mild pain or headache.     ADVAIR DISKUS 250-50 MCG/ACT AEPB Inhale 1 puff into the lungs 2 (two) times daily.     albuterol (VENTOLIN HFA) 108 (90 Base) MCG/ACT inhaler Inhale 2 puffs into the lungs every 6 (six) hours as needed for wheezing or shortness of breath.     apixaban (ELIQUIS) 5 MG TABS tablet Take 1 tablet (5 mg total) by mouth 2 (two) times daily. 180 tablet 3   diltiazem (CARDIZEM LA) 240 MG 24 hr tablet Take 1 tablet (240 mg total) by mouth daily. 180 tablet 3   pantoprazole (PROTONIX) 40 MG tablet Take 1 tablet (40 mg total) by mouth 2 (two) times daily. 60 tablet 0   prochlorperazine (COMPAZINE) 10 MG tablet Take 1 tablet (10 mg total) by mouth every 6 (six) hours as needed for nausea or vomiting. 30 tablet 0   raloxifene (EVISTA) 60 MG tablet Take 60 mg by mouth daily.     sertraline (ZOLOFT) 50 MG tablet Take 50 mg by mouth daily.     simvastatin (ZOCOR) 10 MG tablet Take 10 mg by mouth at bedtime.     traZODone (DESYREL) 50 MG tablet Take 25 mg by mouth See admin instructions. Take one to two 25 mg half-tablets by mouth as needed at bedtime     No current facility-administered medications for this  visit.    SURGICAL HISTORY:  Past Surgical History:  Procedure Laterality Date   APPENDECTOMY     BALLOON DILATION N/A 07/18/2022   Procedure: BALLOON DILATION;  Surgeon: Kerin Salen, MD;  Location: [facility];  Service: Gastroenterology;  Laterality: N/A;   BIOPSY  07/18/2022   Procedure: BIOPSY;  Surgeon: Kerin Salen, MD;  Location: [facility];  Service: Gastroenterology;;   ESOPHAGOGASTRODUODENOSCOPY N/A 07/18/2022   Procedure: ESOPHAGOGASTRODUODENOSCOPY (EGD);  Surgeon: Kerin Salen, MD;  Location: [facility];  Service: Gastroenterology;  Laterality: N/A;   EYE SURGERY     INTERCOSTAL NERVE BLOCK Right 03/17/2020   Procedure: Intercostal Nerve Block;   Surgeon: Loreli Slot, MD;  Location: [facility] OR;  Service: Thoracic;  Laterality: Right;   LUNG SURGERY Right 03/17/2020   NODE DISSECTION Right 03/17/2020   Procedure: Node Dissection;  Surgeon: Loreli Slot, MD;  Location: [facility] OR;  Service: Thoracic;  Laterality: Right;   TUBAL LIGATION     VIDEO BRONCHOSCOPY WITH ENDOBRONCHIAL ULTRASOUND N/A 05/22/2022   Procedure: VIDEO BRONCHOSCOPY WITH ENDOBRONCHIAL ULTRASOUND;  Surgeon: Loreli Slot, MD;  Location: [facility] OR;  Service: Thoracic;  Laterality: N/A;    REVIEW OF SYSTEMS:  Constitutional: positive for fatigue Eyes: negative Ears, nose, mouth, throat, and face: negative Respiratory: negative Cardiovascular: negative Gastrointestinal: negative Genitourinary:negative Integument/breast: negative Hematologic/lymphatic: negative Musculoskeletal:negative Neurological: negative Behavioral/Psych: negative Endocrine: negative Allergic/Immunologic: negative   PHYSICAL EXAMINATION: General appearance: alert, cooperative, fatigued, and no distress Head: Normocephalic, without obvious abnormality, atraumatic Neck: no adenopathy, no JVD, supple, symmetrical, trachea midline, and thyroid not enlarged, symmetric, no tenderness/mass/nodules Lymph nodes: Cervical, supraclavicular, and axillary nodes normal. Resp: clear to auscultation bilaterally Back: symmetric, no curvature. ROM normal. No CVA tenderness. Cardio: regular rate and rhythm, S1, S2 normal, no murmur, click, rub or gallop GI: soft, non-tender; bowel sounds normal; no masses,  no organomegaly Extremities: extremities normal, atraumatic, no cyanosis or edema Neurologic: Alert and oriented X 3, normal strength and tone. Normal symmetric reflexes. Normal coordination and gait  ECOG PERFORMANCE STATUS: 1 - Symptomatic but completely ambulatory  Blood pressure 118/82, pulse (!) 108, temperature 97.8 F (36.6 C), temperature source Oral, resp. rate 16, weight 152 lb 1.6  oz (69 kg), SpO2 95 %.   LABORATORY DATA: Lab Results  Component Value Date   WBC 8.0 02/06/2023   HGB 13.3 02/06/2023   HCT 40.7 02/06/2023   MCV 94.7 02/06/2023   PLT 242 02/06/2023      Chemistry      Component Value Date/Time   NA 140 02/06/2023 0951   K 4.1 02/06/2023 0951   CL 103 02/06/2023 0951   CO2 29 02/06/2023 0951   BUN 19 02/06/2023 0951   CREATININE 1.33 (H) 02/06/2023 0951      Component Value Date/Time   CALCIUM 9.8 02/06/2023 0951   ALKPHOS 82 02/06/2023 0951   AST 18 02/06/2023 0951   ALT 14 02/06/2023 0951   BILITOT 0.7 02/06/2023 0951       RADIOGRAPHIC STUDIES: CT Chest W Contrast  Result Date: 02/05/2023 CLINICAL DATA:  Recurrent non-small cell lung cancer; * Tracking Code: BO * EXAM: CT CHEST WITH CONTRAST TECHNIQUE: Multidetector CT imaging of the chest was performed during intravenous contrast administration. RADIATION DOSE REDUCTION: This exam was performed according to the departmental dose-optimization program which includes automated exposure control, adjustment of the mA and/or kV according to patient size and/or use of iterative reconstruction technique. CONTRAST:  60mL OMNIPAQUE IOHEXOL 300 MG/ML  SOLN COMPARISON:  Cancer screening CT dated November 13, 2022 FINDINGS: Cardiovascular: Normal heart size. No pericardial effusion. Normal caliber thoracic aorta with severe atherosclerotic disease. Severe coronary artery calcifications. Mediastinum/Nodes: Unchanged mild circumferential wall thickening of the esophagus. Stable prominent subcentimeter prevascular lymph node measuring 7 mm short axis on series 2, image 75. Stable prominent subcentimeter left pulmonary ligament lymph node measuring 8 mm in short axis on image 88. No enlarged lymph nodes seen in the chest. Lungs/Pleura: Prior right lower superior segmentectomy. Unchanged pleural-parenchymal scarring of the left lung apex. Moderate centrilobular emphysema. Stable right perihilar posttreatment  change. New scattered solid pulmonary nodules, most numerous in the right upper lobe. Reference upper lobe 5 mm solid nodule located on series 7, image 59. Additional new small subpleural nodule of the left lower lobe measuring 3 mm on series 7, image 75. Additional previously seen scattered solid pulmonary nodules are stable. For example, stable solid left upper lobe nodule measuring 5 mm on series 7, image 38. Pleural effusion. Upper Abdomen: No acute abnormality. Musculoskeletal: No chest wall abnormality. No acute or significant osseous findings. IMPRESSION: 1. Stable right perihilar posttreatment change. 2. New scattered solid pulmonary nodules, most numerous in the right upper lobe and measuring up to 5 mm, likely infectious or inflammatory. Recommend short-term follow-up chest CT in 3 months ensure resolution. 3. Additional previously seen scattered solid pulmonary nodules are stable. 4. Stable prominent subcentimeter mediastinal lymph nodes. 5. Aortic Atherosclerosis (ICD10-I70.0) and Emphysema (ICD10-J43.9). Electronically Signed   By: Allegra Lai M.D.   On: 02/05/2023 21:41    ASSESSMENT AND PLAN: This is a very pleasant 77 years old white female with suspicious recurrent non-small cell lung cancer initially diagnosed as history of stage IA (T1 a, N0, M0) non-small cell lung cancer, squamous cell carcinoma presented with right lower lobe lung nodule diagnosed in June 2021 status post right lower lobe superior segmentectomy with lymph node dissection under the care of Dr. Dorris Fetch on March 17, 2020.  The patient has evidence for disease recurrence in July 2023. The patient is feeling fine today with no concerning complaints except for the back pain. She had repeat CT scan of the chest performed recently.  I personally and independently reviewed the scan images and discussed the results with the patient today. Her scan showed mild increase in the right infrahilar soft tissue encasing the bronchus  intermedius and disease recurrence could not be completely excluded.  There was also new mild bilateral lower paratracheal lymphadenopathy that is nonspecific and nodal metastasis could not be completely excluded. The patient had a PET scan performed recently and that showed intensely hypermetabolic recurrence in the right infrahilar region with mildly prominent left paratracheal and paraesophageal lymph nodes also mildly hypermetabolic for size and suspicious for nodal metastasis but no distant metastatic disease. The patient underwent repeat bronchoscopy with EBUS under the care of Dr. Dorris Fetch and the final pathology was consistent with recurrent squamous cell carcinoma of the lung.  MRI of the brain was negative for malignancy. She underwent treatment with concurrent chemoradiation with weekly carboplatin for AUC of 2 and paclitaxel 45 Mg/M2.  First dose today June 05, 2022.  Status post 6 cycles.  She has partial response. The patient is currently undergoing consolidation treatment with immunotherapy with Imfinzi 1500 Mg IV every 4 weeks status post 6 cycles.  The patient has been tolerating this treatment well with no concerning adverse effects. She had repeat CT scan of the chest performed recently.  I personally and independently reviewed the  scan images and discussed the results with the patient today.  Her scan showed no concerning findings for disease progression but there was new scattered solid pulmonary nodules mostly in the right upper lobe up to 5 mm in size and likely inflammatory in origin but disease recurrence could not be excluded. I recommended for the patient to continue her current treatment with immunotherapy with cycle #7 today. I will see her back for follow-up visit in 4 weeks for evaluation before the next cycle of her treatment. I will consider repeating her scan in around 2 months from now for further evaluation of these nodules. The patient was advised to call  immediately if she has any concerning symptoms in the interval.  The patient voices understanding of current disease status and treatment options and is in agreement with the current care plan.  All questions were answered. The patient knows to call the clinic with any problems, questions or concerns. We can certainly see the patient much sooner if necessary. The total time spent in the appointment was 30 minutes.   Disclaimer: This note was dictated with voice recognition software. Similar sounding words can inadvertently be transcribed and may not be corrected upon review.

## 2023-03-06 ENCOUNTER — Inpatient Hospital Stay (HOSPITAL_BASED_OUTPATIENT_CLINIC_OR_DEPARTMENT_OTHER): Payer: Medicare HMO | Admitting: Internal Medicine

## 2023-03-06 ENCOUNTER — Encounter: Payer: Self-pay | Admitting: Medical Oncology

## 2023-03-06 ENCOUNTER — Inpatient Hospital Stay: Payer: Medicare HMO

## 2023-03-06 ENCOUNTER — Inpatient Hospital Stay: Payer: Medicare HMO | Attending: Internal Medicine

## 2023-03-06 VITALS — BP 116/58 | HR 85 | Resp 18

## 2023-03-06 VITALS — BP 132/60 | HR 91 | Temp 97.3°F | Resp 18 | Wt 152.8 lb

## 2023-03-06 DIAGNOSIS — Z5112 Encounter for antineoplastic immunotherapy: Secondary | ICD-10-CM | POA: Insufficient documentation

## 2023-03-06 DIAGNOSIS — C3490 Malignant neoplasm of unspecified part of unspecified bronchus or lung: Secondary | ICD-10-CM

## 2023-03-06 DIAGNOSIS — C3431 Malignant neoplasm of lower lobe, right bronchus or lung: Secondary | ICD-10-CM

## 2023-03-06 LAB — CBC WITH DIFFERENTIAL (CANCER CENTER ONLY)
Abs Immature Granulocytes: 0.03 10*3/uL (ref 0.00–0.07)
Basophils Absolute: 0.1 10*3/uL (ref 0.0–0.1)
Basophils Relative: 1 %
Eosinophils Absolute: 0.6 10*3/uL — ABNORMAL HIGH (ref 0.0–0.5)
Eosinophils Relative: 6 %
HCT: 40.2 % (ref 36.0–46.0)
Hemoglobin: 13.5 g/dL (ref 12.0–15.0)
Immature Granulocytes: 0 %
Lymphocytes Relative: 11 %
Lymphs Abs: 1 10*3/uL (ref 0.7–4.0)
MCH: 31.9 pg (ref 26.0–34.0)
MCHC: 33.6 g/dL (ref 30.0–36.0)
MCV: 95 fL (ref 80.0–100.0)
Monocytes Absolute: 0.6 10*3/uL (ref 0.1–1.0)
Monocytes Relative: 7 %
Neutro Abs: 6.8 10*3/uL (ref 1.7–7.7)
Neutrophils Relative %: 75 %
Platelet Count: 254 10*3/uL (ref 150–400)
RBC: 4.23 MIL/uL (ref 3.87–5.11)
RDW: 13.4 % (ref 11.5–15.5)
WBC Count: 9.1 10*3/uL (ref 4.0–10.5)
nRBC: 0 % (ref 0.0–0.2)

## 2023-03-06 LAB — CMP (CANCER CENTER ONLY)
ALT: 13 U/L (ref 0–44)
AST: 16 U/L (ref 15–41)
Albumin: 4 g/dL (ref 3.5–5.0)
Alkaline Phosphatase: 83 U/L (ref 38–126)
Anion gap: 5 (ref 5–15)
BUN: 22 mg/dL (ref 8–23)
CO2: 29 mmol/L (ref 22–32)
Calcium: 9.4 mg/dL (ref 8.9–10.3)
Chloride: 106 mmol/L (ref 98–111)
Creatinine: 1.37 mg/dL — ABNORMAL HIGH (ref 0.44–1.00)
GFR, Estimated: 40 mL/min — ABNORMAL LOW (ref >60)
Glucose, Bld: 103 mg/dL — ABNORMAL HIGH (ref 70–99)
Potassium: 4.3 mmol/L (ref 3.5–5.1)
Sodium: 140 mmol/L (ref 135–145)
Total Bilirubin: 0.5 mg/dL (ref 0.3–1.2)
Total Protein: 6.9 g/dL (ref 6.5–8.1)

## 2023-03-06 MED ORDER — HEPARIN SOD (PORK) LOCK FLUSH 100 UNIT/ML IV SOLN: 500.0000 [IU] | Freq: Once | INTRAVENOUS | Status: DC | PRN

## 2023-03-06 MED ORDER — SODIUM CHLORIDE 0.9 % IV SOLN: Freq: Once | INTRAVENOUS | Status: AC

## 2023-03-06 MED ORDER — SODIUM CHLORIDE 0.9 % IV SOLN
1500.0000 mg | Freq: Once | INTRAVENOUS | Status: AC
  Administered 2023-03-06: 1500 mg via INTRAVENOUS
  Filled 2023-03-06: qty 30

## 2023-03-06 MED ORDER — SODIUM CHLORIDE 0.9% FLUSH: 10.0000 mL | INTRAVENOUS | Status: DC | PRN

## 2023-03-06 NOTE — Progress Notes (Signed)
Patient seen by Dr. Mohamed  Vitals are within treatment parameters.   Labs reviewed: and are within treatment parameters.  Per physician team, patient is ready for treatment and there are NO modifications to the treatment plan.  Durvalumab 

## 2023-03-06 NOTE — Patient Instructions (Signed)
[facility]  Discharge Instructions: Thank you for choosing [facility] to provide your oncology and hematology care.   If you have a lab appointment with the Cancer Center, please go directly to the Cancer Center and check in at the registration area.   Wear comfortable clothing and clothing appropriate for easy access to any Portacath or PICC line.   We strive to give you quality time with your provider. You may need to reschedule your appointment if you arrive late (15 or more minutes).  Arriving late affects you and other patients whose appointments are after yours.  Also, if you miss three or more appointments without notifying the office, you may be dismissed from the clinic at the provider's discretion.      For prescription refill requests, have your pharmacy contact our office and allow 72 hours for refills to be completed.    Today you received the following chemotherapy and/or immunotherapy agents: Imfinzi      To help prevent nausea and vomiting after your treatment, we encourage you to take your nausea medication as directed.  BELOW ARE SYMPTOMS THAT SHOULD BE REPORTED IMMEDIATELY: *FEVER GREATER THAN 100.4 F (38 C) OR HIGHER *CHILLS OR SWEATING *NAUSEA AND VOMITING THAT IS NOT CONTROLLED WITH YOUR NAUSEA MEDICATION *UNUSUAL SHORTNESS OF BREATH *UNUSUAL BRUISING OR BLEEDING *URINARY PROBLEMS (pain or burning when urinating, or frequent urination) *BOWEL PROBLEMS (unusual diarrhea, constipation, pain near the anus) TENDERNESS IN MOUTH AND THROAT WITH OR WITHOUT PRESENCE OF ULCERS (sore throat, sores in mouth, or a toothache) UNUSUAL RASH, SWELLING OR PAIN  UNUSUAL VAGINAL DISCHARGE OR ITCHING   Items with * indicate a potential emergency and should be followed up as soon as possible or go to the Emergency Department if any problems should occur.  Please show the CHEMOTHERAPY ALERT CARD or IMMUNOTHERAPY ALERT CARD at  check-in to the Emergency Department and triage nurse.  Should you have questions after your visit or need to cancel or reschedule your appointment, please contact [facility]  Dept: [phone]  and follow the prompts.  Office hours are 8:00 a.m. to 4:30 p.m. Monday - Friday. Please note that voicemails left after 4:00 p.m. may not be returned until the following business day.  We are closed weekends and major holidays. You have access to a nurse at all times for urgent questions. Please call the main number to the clinic Dept: [phone] and follow the prompts.   For any non-urgent questions, you may also contact your provider using MyChart. We now offer e-Visits for anyone 18 and older to request care online for non-urgent symptoms. For details visit [email or web address].   Also download the MyChart app! Go to the app store, search "MyChart", open the app, select , and log in with your MyChart username and password.   

## 2023-03-06 NOTE — Progress Notes (Signed)
[facility] Telephone:([phone]) [phone]   Fax:([phone]) [phone]  OFFICE PROGRESS NOTE  Johny Blamer, MD [address] [place] [address]  DIAGNOSIS: Recurrent lung cancer initially diagnosed as stage IA (T1, N0, M0) non-small cell lung cancer, squamous cell carcinoma.  She presented with a right lower lobe lung nodule.  She was diagnosed in June 2021.    PRIOR THERAPY: Right lower lobe superior segmentectomy and lymph node dissection under the care of Dr. Dorris Fetch on 03/17/2020. 2) Concurrent chemoradiation with weekly carboplatin for AUC of 2 and paclitaxel 45 Mg/M2. Last dose on 07/10/22  Status post 6 cycles.   CURRENT THERAPY: Consolidation immunotherapy with Imfinzi 1500 mg IV every 4 weeks.  First dose expected on 08/22/2022.  Status post 7 cycles.  INTERVAL HISTORY: Amy [name] 77 y.o. female returns to the clinic today for follow-up visit.  The patient is feeling fine today with no concerning complaints except for occasional shortness of breath with exertion.  She denied having any chest pain but has mild cough with no hemoptysis.  She has no nausea, vomiting, diarrhea or constipation.  She has no headache or visual changes.  She continues to tolerate her treatment with immunotherapy fairly well.  She is here today for evaluation before starting cycle #8 of her treatment.   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  Status post right lower lobe superior segmentectomy    ALLERGIES:  has No Known Allergies.  MEDICATIONS:  Current Outpatient Medications  Medication Sig Dispense Refill   acetaminophen (TYLENOL)  500 MG tablet Take 500-1,000 mg by mouth every 6 (six) hours as needed for mild pain or headache.     ADVAIR DISKUS 250-50 MCG/ACT AEPB Inhale 1 puff into the lungs 2 (two) times daily.     albuterol (VENTOLIN HFA) 108 (90 Base) MCG/ACT inhaler Inhale 2 puffs into the lungs every 6 (six) hours as needed for wheezing or shortness of breath.     apixaban (ELIQUIS) 5 MG TABS tablet Take 1 tablet (5 mg total) by mouth 2 (two) times daily. 180 tablet 3   diltiazem (CARDIZEM LA) 240 MG 24 hr tablet Take 1 tablet (240 mg total) by mouth daily. 180 tablet 3   pantoprazole (PROTONIX) 40 MG tablet Take 1 tablet (40 mg total) by mouth 2 (two) times daily. 60 tablet 0   prochlorperazine (COMPAZINE) 10 MG tablet Take 1 tablet (10 mg total) by mouth every 6 (six) hours as needed for nausea or vomiting. 30 tablet 0   raloxifene (EVISTA) 60 MG tablet Take 60 mg by mouth daily.     sertraline (ZOLOFT) 50 MG tablet Take 50 mg by mouth daily.     simvastatin (ZOCOR) 10 MG tablet Take 10 mg by mouth at bedtime.     traZODone (DESYREL) 50 MG tablet Take 25 mg by mouth See admin instructions. Take one to two 25 mg half-tablets by mouth as needed at bedtime     No current facility-administered medications for this visit.    SURGICAL HISTORY:  Past Surgical  History:  Procedure Laterality Date   APPENDECTOMY     BALLOON DILATION N/A 07/18/2022   Procedure: BALLOON DILATION;  Surgeon: Kerin Salen, MD;  Location: [facility];  Service: Gastroenterology;  Laterality: N/A;   BIOPSY  07/18/2022   Procedure: BIOPSY;  Surgeon: Kerin Salen, MD;  Location: [facility];  Service: Gastroenterology;;   ESOPHAGOGASTRODUODENOSCOPY N/A 07/18/2022   Procedure: ESOPHAGOGASTRODUODENOSCOPY (EGD);  Surgeon: Kerin Salen, MD;  Location: [facility];  Service: Gastroenterology;  Laterality: N/A;   EYE SURGERY     INTERCOSTAL NERVE BLOCK Right 03/17/2020   Procedure: Intercostal Nerve Block;  Surgeon: Loreli Slot, MD;   Location: [facility] OR;  Service: Thoracic;  Laterality: Right;   LUNG SURGERY Right 03/17/2020   NODE DISSECTION Right 03/17/2020   Procedure: Node Dissection;  Surgeon: Loreli Slot, MD;  Location: [facility] OR;  Service: Thoracic;  Laterality: Right;   TUBAL LIGATION     VIDEO BRONCHOSCOPY WITH ENDOBRONCHIAL ULTRASOUND N/A 05/22/2022   Procedure: VIDEO BRONCHOSCOPY WITH ENDOBRONCHIAL ULTRASOUND;  Surgeon: Loreli Slot, MD;  Location: [facility] OR;  Service: Thoracic;  Laterality: N/A;    REVIEW OF SYSTEMS:  A comprehensive review of systems was negative except for: Respiratory: positive for dyspnea on exertion   PHYSICAL EXAMINATION: General appearance: alert, cooperative, and no distress Head: Normocephalic, without obvious abnormality, atraumatic Neck: no adenopathy, no JVD, supple, symmetrical, trachea midline, and thyroid not enlarged, symmetric, no tenderness/mass/nodules Lymph nodes: Cervical, supraclavicular, and axillary nodes normal. Resp: clear to auscultation bilaterally Back: symmetric, no curvature. ROM normal. No CVA tenderness. Cardio: regular rate and rhythm, S1, S2 normal, no murmur, click, rub or gallop GI: soft, non-tender; bowel sounds normal; no masses,  no organomegaly Extremities: extremities normal, atraumatic, no cyanosis or edema  ECOG PERFORMANCE STATUS: 1 - Symptomatic but completely ambulatory  Blood pressure 132/60, pulse 91, temperature (!) 97.3 F (36.3 C), temperature source Temporal, resp. rate 18, weight 152 lb 12.8 oz (69.3 kg), SpO2 100 %.   LABORATORY DATA: Lab Results  Component Value Date   WBC 9.1 03/06/2023   HGB 13.5 03/06/2023   HCT 40.2 03/06/2023   MCV 95.0 03/06/2023   PLT 254 03/06/2023      Chemistry      Component Value Date/Time   NA 140 02/06/2023 0951   K 4.1 02/06/2023 0951   CL 103 02/06/2023 0951   CO2 29 02/06/2023 0951   BUN 19 02/06/2023 0951   CREATININE 1.33 (H) 02/06/2023 0951      Component Value Date/Time    CALCIUM 9.8 02/06/2023 0951   ALKPHOS 82 02/06/2023 0951   AST 18 02/06/2023 0951   ALT 14 02/06/2023 0951   BILITOT 0.7 02/06/2023 0951       RADIOGRAPHIC STUDIES: No results found.  ASSESSMENT AND PLAN: This is a very pleasant 77 years old white female with suspicious recurrent non-small cell lung cancer initially diagnosed as history of stage IA (T1 a, N0, M0) non-small cell lung cancer, squamous cell carcinoma presented with right lower lobe lung nodule diagnosed in June 2021 status post right lower lobe superior segmentectomy with lymph node dissection under the care of Dr. Dorris Fetch on March 17, 2020.  The patient has evidence for disease recurrence in July 2023. The patient is feeling fine today with no concerning complaints except for the back pain. She had repeat CT scan of the chest performed recently.  I personally and independently reviewed the scan images and discussed the results with the patient today. Her scan  showed mild increase in the right infrahilar soft tissue encasing the bronchus intermedius and disease recurrence could not be completely excluded.  There was also new mild bilateral lower paratracheal lymphadenopathy that is nonspecific and nodal metastasis could not be completely excluded. The patient had a PET scan performed recently and that showed intensely hypermetabolic recurrence in the right infrahilar region with mildly prominent left paratracheal and paraesophageal lymph nodes also mildly hypermetabolic for size and suspicious for nodal metastasis but no distant metastatic disease. The patient underwent repeat bronchoscopy with EBUS under the care of Dr. Dorris Fetch and the final pathology was consistent with recurrent squamous cell carcinoma of the lung.  MRI of the brain was negative for malignancy. She underwent treatment with concurrent chemoradiation with weekly carboplatin for AUC of 2 and paclitaxel 45 Mg/M2.  First dose today June 05, 2022.  Status post 6  cycles.  She has partial response. The patient is currently undergoing consolidation treatment with immunotherapy with Imfinzi 1500 Mg IV every 4 weeks status post 7 cycles.   The patient tolerated the last cycle of her treatment well with no concerning adverse effects. I recommended for her to proceed with cycle #8 today as planned. I will see her back for follow-up visit in 4 weeks for evaluation with repeat CT scan of the chest for restaging of her disease. She was advised to call immediately if she has any concerning symptoms in the interval. The patient voices understanding of current disease status and treatment options and is in agreement with the current care plan.  All questions were answered. The patient knows to call the clinic with any problems, questions or concerns. We can certainly see the patient much sooner if necessary. The total time spent in the appointment was 20 minutes.   Disclaimer: This note was dictated with voice recognition software. Similar sounding words can inadvertently be transcribed and may not be corrected upon review.

## 2023-03-29 NOTE — Progress Notes (Signed)
[facility] OFFICE PROGRESS NOTE  Noberto Retort, MD 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  She presented with a right lower lobe lung nodule.  She was diagnosed in June 2021.    PDL1 Expression 1%  PRIOR THERAPY: 1)  Right lower lobe superior segmentectomy and lymph node dissection under the care of Dr. Dorris Fetch on 03/17/2020. 2) Concurrent chemoradiation with weekly carboplatin for AUC of 2 and paclitaxel 45 Mg/M2. Last dose on 07/10/22  Status post 6 cycles.  CURRENT THERAPY: Consolidation immunotherapy with Imfinzi 1500 mg IV every 4 weeks.  First dose expected on 08/22/2022.  Status post 8 cycles.   INTERVAL HISTORY: Amy [name] 77 y.o. female returns to the clinic for a follow up visit. The patient is feeling well today without any concerning complaints. The patient continues to tolerate treatment with immunotherapy with Imfinzi well without any adverse effects. Denies any fever, chills, night sweats, or weight loss. Denies any chest pain, shortness of breath, or hemoptysis. She only coughs sometime when she is eating. She mentions that she has needed her esophagus dilated in the past. Her last scan mentioned stable mild esophageal thickening. She denies recent URIs. Denies any nausea, vomiting, diarrhea, or constipation. Denies any headache or visual changes. Denies any rashes or skin changes. The patient recently had a restaging CT scan. The patient is here today for evaluation prior to starting cycle # 9.   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  Status post right lower lobe superior segmentectomy    ALLERGIES:  has No Known Allergies.  MEDICATIONS:  Current Outpatient Medications  Medication Sig Dispense Refill   acetaminophen (TYLENOL) 500 MG tablet Take 500-1,000 mg by mouth every 6 (six) hours as needed for mild pain or headache.     ADVAIR DISKUS 250-50 MCG/ACT AEPB Inhale 1 puff into the lungs 2 (two) times daily.     albuterol (VENTOLIN HFA) 108 (90 Base) MCG/ACT inhaler Inhale 2 puffs into the lungs every 6 (six) hours as needed for wheezing or shortness of breath.     apixaban (ELIQUIS) 5 MG TABS tablet Take 1 tablet (5 mg total) by mouth 2 (two) times daily. 180 tablet 3   diltiazem (CARDIZEM LA) 240 MG 24 hr tablet Take 1 tablet (240 mg total) by mouth daily. 180 tablet 3   prochlorperazine (COMPAZINE) 10 MG tablet Take 1 tablet (10 mg total) by mouth every 6 (six) hours as needed for nausea or vomiting. 30 tablet 0   raloxifene (EVISTA) 60 MG tablet Take 60 mg by mouth daily.     sertraline (ZOLOFT) 50 MG tablet Take 50 mg by mouth daily.     simvastatin (ZOCOR) 10 MG tablet Take 10 mg by mouth at bedtime.     traZODone (DESYREL) 50 MG tablet Take 25 mg by mouth See admin instructions. Take one to two 25 mg half-tablets by mouth as needed at bedtime  pantoprazole (PROTONIX) 40 MG tablet Take 1 tablet (40 mg total) by mouth 2 (two) times daily. 60 tablet 0   No current facility-administered medications for this visit.    SURGICAL HISTORY:  Past Surgical History:  Procedure Laterality Date   APPENDECTOMY     BALLOON DILATION N/A 07/18/2022   Procedure: BALLOON DILATION;  Surgeon: Kerin Salen, MD;  Location: [facility];  Service: Gastroenterology;  Laterality: N/A;   BIOPSY  07/18/2022   Procedure: BIOPSY;  Surgeon: Kerin Salen, MD;  Location: [facility];  Service: Gastroenterology;;   ESOPHAGOGASTRODUODENOSCOPY N/A 07/18/2022   Procedure:  ESOPHAGOGASTRODUODENOSCOPY (EGD);  Surgeon: Kerin Salen, MD;  Location: [facility];  Service: Gastroenterology;  Laterality: N/A;   EYE SURGERY     INTERCOSTAL NERVE BLOCK Right 03/17/2020   Procedure: Intercostal Nerve Block;  Surgeon: Loreli Slot, MD;  Location: [facility] OR;  Service: Thoracic;  Laterality: Right;   LUNG SURGERY Right 03/17/2020   NODE DISSECTION Right 03/17/2020   Procedure: Node Dissection;  Surgeon: Loreli Slot, MD;  Location: [facility] OR;  Service: Thoracic;  Laterality: Right;   TUBAL LIGATION     VIDEO BRONCHOSCOPY WITH ENDOBRONCHIAL ULTRASOUND N/A 05/22/2022   Procedure: VIDEO BRONCHOSCOPY WITH ENDOBRONCHIAL ULTRASOUND;  Surgeon: Loreli Slot, MD;  Location: [facility] OR;  Service: Thoracic;  Laterality: N/A;    REVIEW OF SYSTEMS:   Constitutional: Negative for appetite change, chills, fatigue, fever and unexpected weight change.  HENT:   Negative for mouth sores, nosebleeds, sore throat and trouble swallowing.   Eyes: Negative for eye problems and icterus.  Respiratory: Positive for coughing when she eats. Negative for hemoptysis, shortness of breath and wheezing.   Cardiovascular: Negative for chest pain and leg swelling.  Gastrointestinal: Negative for abdominal pain, constipation, diarrhea, nausea and vomiting.  Genitourinary: Negative for bladder incontinence, difficulty urinating, dysuria, frequency and hematuria.   Musculoskeletal: Negative for back pain, gait problem, neck pain and neck stiffness.  Skin: Negative for itching and rash.  Neurological: Negative for dizziness, extremity weakness, gait problem, headaches, light-headedness and seizures.  Hematological: Negative for adenopathy. Does not bruise/bleed easily.  Psychiatric/Behavioral: Negative for confusion, depression and sleep disturbance. The patient is not nervous/anxious.   PHYSICAL EXAMINATION:  Blood pressure (!) 153/66, pulse 87, temperature 97.8 F (36.6 C), temperature source  Oral, resp. rate 15, weight 155 lb 9.6 oz (70.6 kg), SpO2 97 %.  ECOG PERFORMANCE STATUS: 1  Physical Exam  Constitutional: Oriented to person, place, and time and well-developed, well-nourished, and in no distress.  HENT:  Head: Normocephalic and atraumatic.  Mouth/Throat: Oropharynx is clear and moist. No oropharyngeal exudate.  Eyes: Conjunctivae are normal. Right eye exhibits no discharge. Left eye exhibits no discharge. No scleral icterus.  Neck: Normal range of motion. Neck supple.  Cardiovascular: Normal rate, regular rhythm, normal heart sounds and intact distal pulses.   Pulmonary/Chest: Effort normal and breath sounds normal. No respiratory distress. No wheezes. No rales.  Abdominal: Soft. Bowel sounds are normal. Exhibits no distension and no mass. There is no tenderness.  Musculoskeletal: Normal range of motion. Exhibits no edema.  Lymphadenopathy:    No cervical adenopathy.  Neurological: Alert and oriented to person, place, and time. Exhibits normal muscle tone. Gait normal. Coordination normal.  Skin: Skin is warm and dry. No rash noted. Not diaphoretic. No erythema. No pallor.  Psychiatric: Mood, memory and judgment normal.  Vitals reviewed.  LABORATORY DATA: Lab Results  Component Value Date   WBC 8.7 04/03/2023   HGB 13.2  04/03/2023   HCT 39.9 04/03/2023   MCV 95.0 04/03/2023   PLT 268 04/03/2023      Chemistry      Component Value Date/Time   NA 140 04/03/2023 1102   K 4.2 04/03/2023 1102   CL 105 04/03/2023 1102   CO2 28 04/03/2023 1102   BUN 17 04/03/2023 1102   CREATININE 1.12 (H) 04/03/2023 1102      Component Value Date/Time   CALCIUM 9.6 04/03/2023 1102   ALKPHOS 97 04/03/2023 1102   AST 19 04/03/2023 1102   ALT 15 04/03/2023 1102   BILITOT 0.6 04/03/2023 1102       RADIOGRAPHIC STUDIES:  CT Chest W Contrast  Result Date: 04/03/2023 CLINICAL DATA:  Non-small cell lung cancer restaging * Tracking Code: BO * EXAM: CT CHEST WITH CONTRAST  TECHNIQUE: Multidetector CT imaging of the chest was performed during intravenous contrast administration. RADIATION DOSE REDUCTION: This exam was performed according to the departmental dose-optimization program which includes automated exposure control, adjustment of the mA and/or kV according to patient size and/or use of iterative reconstruction technique. CONTRAST:  75mL OMNIPAQUE IOHEXOL 300 MG/ML  SOLN COMPARISON:  02/02/2023 FINDINGS: Cardiovascular: Aortic atherosclerosis. Normal heart size. Three-vessel coronary artery calcifications. No pericardial effusion. Mediastinum/Nodes: No enlarged mediastinal, hilar, or axillary lymph nodes. Thyroid gland, trachea, and esophagus demonstrate no significant findings. Lungs/Pleura: Moderate centrilobular and paraseptal emphysema. Unchanged postoperative/post treatment appearance of the right chest probable right lower lobectomy with fibrotic consolidation and volume loss of the remaining perihilar right lung (series 8, image 88). Multiple new bilateral pulmonary nodules, including a 0.4 cm subpleural nodule of the medial superior segment left lower lobe (series 8, image 46), a 0.3 cm nodule in the dependent left lower lobe (series 8, image 82) and a 0.3 cm nodule of the posterior lingula (series 8, image 74). New nodule in the inferior right upper lobe measuring 0.3 cm (series 8, image 90) and in the peripheral right upper lobe measuring 0.6 cm (series 8, image 73). Other small nodules unchanged. No pleural effusion or pneumothorax. Upper Abdomen: No acute abnormality. Musculoskeletal: No chest wall abnormality. No acute osseous findings. IMPRESSION: 1. Unchanged postoperative/post treatment appearance of the right chest, probable right lower lobectomy with fibrotic consolidation and volume loss of the remaining perihilar right lung. 2. Multiple new small bilateral pulmonary nodules, in addition to nodules newly seen on prior examination, concerning for pulmonary  metastases although possibly infectious or inflammatory. 3. Emphysema. 4. Coronary artery disease. Aortic Atherosclerosis (ICD10-I70.0) and Emphysema (ICD10-J43.9). Electronically Signed   By: Jearld Lesch M.D.   On: 04/03/2023 10:19     ASSESSMENT/PLAN:  This is a very pleasant 77 year old Caucasian female initially diagnosed as a stage Ia (T1 a, N0, M0) non-small cell lung cancer, squamous cell carcinoma. The patient initially presented with a right lower lobe lung nodule in June 2021. She is status post right lower lobe superior segmentectomy with lymph node dissection under the care of Dr. Dorris Fetch on 03/17/20. The patient had evidence of disease recurrence in July 2023.   She had new right infrahilar soft tissue lesion encasing the bronchus intermedius and disease recurrence cannot be completely excluded.  There is also new mild bilateral lower paratracheal lymphadenopathy that is nonspecific and nodal metastatic disease cannot be excluded.  She had a PET scan that showed intensely hypermetabolic recurrence in the right infrahilar region with mild prominent left paratracheal and paraesophageal lymph nodes also mildly hypermetabolic for size and suspicious for nodal metastasis but there  is no evidence of distant metastatic disease.   The patient had a repeat bronchoscopy and EBUS under the care of Dr. Dorris Fetch and the final pathology was consistent with recurrent squamous cell carcinoma.  Therefore, she then underwent concurrent chemoradiation with carboplatin for an AC of 2 and paclitaxel 45 mg/m.  He is status post 6 cycles of treatment.   She is currently on consolidation immunotherapy with Imfinzi 1500 mg IV every 4 weeks.  She status post 8 cycles.  She has been tolerating this well without any concerning adverse side effects.  The patient recently had a restaging CT scan. Dr. Arbutus Ped personally and independently reviewed the scan and discussed the results with the patient. The scan  showed multiple new small bilateral pulmonary nodules in addition to the nodule seen on the prior exam which could reflect pulmonary metastases although could be infectious or inflammatory.  These pulmonary nodules are below the size threshold for a PET scan.  There are also small and would be difficult to biopsy.  Therefore, Dr. Arbutus Ped recommends monitoring them closely.  If they continue to enlarge, then we will have to investigate it further at that time.  For now, Dr. Arbutus Ped does not want to change treatment as this is not clear evidence for disease progression.  Dr. Arbutus Ped recommends that she continue on the same treatment at the same dose.  Labs were reviewed. Recommend that she proceed with cycle #9 today scheduled.   We will see her back for a follow up visit in 4 weeks for evaluation and repeat blood work before starting cycle #10.   The patient briefly mention she only coughs when she eats.  She had a history of having esophageal stricture which required dilation.  If she continues to struggle with coughing with eating, hide recommend that she follow-up with her gastroenterologist.   The patient was advised to call immediately if she has any concerning symptoms in the interval. The patient voices understanding of current disease status and treatment options and is in agreement with the current care plan. All questions were answered. The patient knows to call the clinic with any problems, questions or concerns. We can certainly see the patient much sooner if necessary  No orders of the defined types were placed in this encounter.   Dondra Rhett L Solace Wendorff, PA-C 04/03/23  ADDENDUM: Hematology/Oncology Attending: I had a face-to-face encounter with the patient today.  I reviewed her records, lab, scan and recommended her care plan.  This is a very pleasant 77 years old white female with recurrent non-small cell lung cancer that was initially diagnosed as a stage Ia squamous cell  carcinoma in June 2021 status post right lower lobe superior segmentectomy with lymph node dissection.  The patient received treatment with concurrent chemoradiation for the recurrent disease last dose was given on 07/10/2022.  She is currently undergoing consolidation treatment with immunotherapy with Imfinzi status post 8 cycles and she has been tolerating this treatment well. She had repeat CT scan of the chest performed recently.  I personally and independently reviewed the scan images and discussed the result with the patient today. Her scan showed unchanged fibrotic consolidation and volume loss and posttreatment changes in the right lung but there was multiple new small bilateral pulmonary nodules in addition to the nodule that were seen on the prior exam again concerning for pulmonary metastasis but infectious/inflammatory process are not excluded.  These nodules are too small to characterize at this point with additional imaging studies like a  PET scan or biopsy. I recommended for the patient to continue her current treatment with immunotherapy with Imfinzi for now. Will monitor the pulmonary nodules closely on the upcoming imaging studies and if it continues to increase in size we will consider repeating a PET scan +/- bronchoscopy and biopsy of these nodules followed by additional treatment recommendation. The patient is in agreement with the current plan. She was advised to call immediately if she has any other concerning symptoms in the interval. The total time spent in the appointment was 30 minutes. Disclaimer: This note was dictated with voice recognition software. Similar sounding words can inadvertently be transcribed and may be missed upon review. Lajuana Matte, MD

## 2023-04-02 ENCOUNTER — Ambulatory Visit (HOSPITAL_COMMUNITY)
Admission: RE | Admit: 2023-04-02 | Discharge: 2023-04-02 | Disposition: A | Discharge status: Home / Self Care | Payer: Medicare HMO | Source: Ambulatory Visit | Attending: Internal Medicine | Admitting: Internal Medicine

## 2023-04-02 DIAGNOSIS — C3490 Malignant neoplasm of unspecified part of unspecified bronchus or lung: Secondary | ICD-10-CM | POA: Insufficient documentation

## 2023-04-02 DIAGNOSIS — R918 Other nonspecific abnormal finding of lung field: Secondary | ICD-10-CM | POA: Diagnosis not present

## 2023-04-02 DIAGNOSIS — J439 Emphysema, unspecified: Secondary | ICD-10-CM | POA: Diagnosis not present

## 2023-04-02 MED ORDER — IOHEXOL 300 MG/ML  SOLN
75.0000 mL | Freq: Once | INTRAMUSCULAR | Status: AC | PRN
  Administered 2023-04-02: 75 mL via INTRAVENOUS

## 2023-04-03 ENCOUNTER — Inpatient Hospital Stay: Payer: Medicare HMO | Attending: Internal Medicine

## 2023-04-03 ENCOUNTER — Inpatient Hospital Stay: Payer: Medicare HMO

## 2023-04-03 ENCOUNTER — Inpatient Hospital Stay (HOSPITAL_BASED_OUTPATIENT_CLINIC_OR_DEPARTMENT_OTHER): Payer: Medicare HMO | Admitting: Physician Assistant

## 2023-04-03 ENCOUNTER — Other Ambulatory Visit: Payer: Self-pay

## 2023-04-03 VITALS — BP 153/66 | HR 87 | Temp 97.8°F | Resp 15 | Wt 155.6 lb

## 2023-04-03 DIAGNOSIS — Z9221 Personal history of antineoplastic chemotherapy: Secondary | ICD-10-CM | POA: Diagnosis not present

## 2023-04-03 DIAGNOSIS — C3431 Malignant neoplasm of lower lobe, right bronchus or lung: Secondary | ICD-10-CM | POA: Insufficient documentation

## 2023-04-03 DIAGNOSIS — Z5112 Encounter for antineoplastic immunotherapy: Secondary | ICD-10-CM | POA: Insufficient documentation

## 2023-04-03 DIAGNOSIS — Z79899 Other long term (current) drug therapy: Secondary | ICD-10-CM | POA: Insufficient documentation

## 2023-04-03 DIAGNOSIS — Z923 Personal history of irradiation: Secondary | ICD-10-CM | POA: Insufficient documentation

## 2023-04-03 DIAGNOSIS — R590 Localized enlarged lymph nodes: Secondary | ICD-10-CM | POA: Diagnosis not present

## 2023-04-03 DIAGNOSIS — R918 Other nonspecific abnormal finding of lung field: Secondary | ICD-10-CM | POA: Diagnosis not present

## 2023-04-03 LAB — CBC WITH DIFFERENTIAL (CANCER CENTER ONLY)
Abs Immature Granulocytes: 0.03 10*3/uL (ref 0.00–0.07)
Basophils Absolute: 0.1 10*3/uL (ref 0.0–0.1)
Basophils Relative: 1 %
Eosinophils Absolute: 0.5 10*3/uL (ref 0.0–0.5)
Eosinophils Relative: 6 %
HCT: 39.9 % (ref 36.0–46.0)
Hemoglobin: 13.2 g/dL (ref 12.0–15.0)
Immature Granulocytes: 0 %
Lymphocytes Relative: 13 %
Lymphs Abs: 1.2 10*3/uL (ref 0.7–4.0)
MCH: 31.4 pg (ref 26.0–34.0)
MCHC: 33.1 g/dL (ref 30.0–36.0)
MCV: 95 fL (ref 80.0–100.0)
Monocytes Absolute: 0.6 10*3/uL (ref 0.1–1.0)
Monocytes Relative: 7 %
Neutro Abs: 6.3 10*3/uL (ref 1.7–7.7)
Neutrophils Relative %: 73 %
Platelet Count: 268 10*3/uL (ref 150–400)
RBC: 4.2 MIL/uL (ref 3.87–5.11)
RDW: 13.4 % (ref 11.5–15.5)
WBC Count: 8.7 10*3/uL (ref 4.0–10.5)
nRBC: 0 % (ref 0.0–0.2)

## 2023-04-03 LAB — CMP (CANCER CENTER ONLY)
ALT: 15 U/L (ref 0–44)
AST: 19 U/L (ref 15–41)
Albumin: 3.9 g/dL (ref 3.5–5.0)
Alkaline Phosphatase: 97 U/L (ref 38–126)
Anion gap: 7 (ref 5–15)
BUN: 17 mg/dL (ref 8–23)
CO2: 28 mmol/L (ref 22–32)
Calcium: 9.6 mg/dL (ref 8.9–10.3)
Chloride: 105 mmol/L (ref 98–111)
Creatinine: 1.12 mg/dL — ABNORMAL HIGH (ref 0.44–1.00)
GFR, Estimated: 50 mL/min — ABNORMAL LOW (ref >60)
Glucose, Bld: 93 mg/dL (ref 70–99)
Potassium: 4.2 mmol/L (ref 3.5–5.1)
Sodium: 140 mmol/L (ref 135–145)
Total Bilirubin: 0.6 mg/dL (ref 0.3–1.2)
Total Protein: 6.6 g/dL (ref 6.5–8.1)

## 2023-04-03 LAB — TSH: TSH: 2.829 u[IU]/mL (ref 0.350–4.500)

## 2023-04-03 MED ORDER — SODIUM CHLORIDE 0.9 % IV SOLN
1500.0000 mg | Freq: Once | INTRAVENOUS | Status: AC
  Administered 2023-04-03: 1500 mg via INTRAVENOUS
  Filled 2023-04-03: qty 30

## 2023-04-03 MED ORDER — SODIUM CHLORIDE 0.9 % IV SOLN: Freq: Once | INTRAVENOUS | Status: AC

## 2023-04-03 NOTE — Patient Instructions (Signed)
[facility]  Discharge Instructions: Thank you for choosing [facility] to provide your oncology and hematology care.   If you have a lab appointment with the Cancer Center, please go directly to the Cancer Center and check in at the registration area.   Wear comfortable clothing and clothing appropriate for easy access to any Portacath or PICC line.   We strive to give you quality time with your provider. You may need to reschedule your appointment if you arrive late (15 or more minutes).  Arriving late affects you and other patients whose appointments are after yours.  Also, if you miss three or more appointments without notifying the office, you may be dismissed from the clinic at the provider's discretion.      For prescription refill requests, have your pharmacy contact our office and allow 72 hours for refills to be completed.    Today you received the following chemotherapy and/or immunotherapy agents: Imfinzi      To help prevent nausea and vomiting after your treatment, we encourage you to take your nausea medication as directed.  BELOW ARE SYMPTOMS THAT SHOULD BE REPORTED IMMEDIATELY: *FEVER GREATER THAN 100.4 F (38 C) OR HIGHER *CHILLS OR SWEATING *NAUSEA AND VOMITING THAT IS NOT CONTROLLED WITH YOUR NAUSEA MEDICATION *UNUSUAL SHORTNESS OF BREATH *UNUSUAL BRUISING OR BLEEDING *URINARY PROBLEMS (pain or burning when urinating, or frequent urination) *BOWEL PROBLEMS (unusual diarrhea, constipation, pain near the anus) TENDERNESS IN MOUTH AND THROAT WITH OR WITHOUT PRESENCE OF ULCERS (sore throat, sores in mouth, or a toothache) UNUSUAL RASH, SWELLING OR PAIN  UNUSUAL VAGINAL DISCHARGE OR ITCHING   Items with * indicate a potential emergency and should be followed up as soon as possible or go to the Emergency Department if any problems should occur.  Please show the CHEMOTHERAPY ALERT CARD or IMMUNOTHERAPY ALERT CARD at  check-in to the Emergency Department and triage nurse.  Should you have questions after your visit or need to cancel or reschedule your appointment, please contact [facility]  Dept: [phone]  and follow the prompts.  Office hours are 8:00 a.m. to 4:30 p.m. Monday - Friday. Please note that voicemails left after 4:00 p.m. may not be returned until the following business day.  We are closed weekends and major holidays. You have access to a nurse at all times for urgent questions. Please call the main number to the clinic Dept: [phone] and follow the prompts.   For any non-urgent questions, you may also contact your provider using MyChart. We now offer e-Visits for anyone 18 and older to request care online for non-urgent symptoms. For details visit [email or web address].   Also download the MyChart app! Go to the app store, search "MyChart", open the app, select [facility], and log in with your MyChart username and password.   

## 2023-04-24 ENCOUNTER — Other Ambulatory Visit: Payer: Self-pay | Admitting: Internal Medicine

## 2023-04-24 DIAGNOSIS — C3431 Malignant neoplasm of lower lobe, right bronchus or lung: Secondary | ICD-10-CM

## 2023-04-30 ENCOUNTER — Inpatient Hospital Stay (HOSPITAL_BASED_OUTPATIENT_CLINIC_OR_DEPARTMENT_OTHER): Payer: Medicare HMO | Admitting: Internal Medicine

## 2023-04-30 ENCOUNTER — Other Ambulatory Visit: Payer: Self-pay

## 2023-04-30 ENCOUNTER — Inpatient Hospital Stay: Payer: Medicare HMO | Attending: Internal Medicine

## 2023-04-30 ENCOUNTER — Inpatient Hospital Stay: Payer: Medicare HMO

## 2023-04-30 VITALS — BP 140/79 | HR 89 | Temp 97.7°F | Resp 17 | Ht 63.0 in | Wt 155.1 lb

## 2023-04-30 DIAGNOSIS — C3431 Malignant neoplasm of lower lobe, right bronchus or lung: Secondary | ICD-10-CM | POA: Diagnosis not present

## 2023-04-30 DIAGNOSIS — Z5112 Encounter for antineoplastic immunotherapy: Secondary | ICD-10-CM | POA: Diagnosis not present

## 2023-04-30 DIAGNOSIS — Z79899 Other long term (current) drug therapy: Secondary | ICD-10-CM | POA: Insufficient documentation

## 2023-04-30 DIAGNOSIS — M549 Dorsalgia, unspecified: Secondary | ICD-10-CM | POA: Insufficient documentation

## 2023-04-30 DIAGNOSIS — Z7901 Long term (current) use of anticoagulants: Secondary | ICD-10-CM | POA: Diagnosis not present

## 2023-04-30 DIAGNOSIS — R591 Generalized enlarged lymph nodes: Secondary | ICD-10-CM | POA: Insufficient documentation

## 2023-04-30 DIAGNOSIS — C3490 Malignant neoplasm of unspecified part of unspecified bronchus or lung: Secondary | ICD-10-CM | POA: Diagnosis not present

## 2023-04-30 DIAGNOSIS — Z7951 Long term (current) use of inhaled steroids: Secondary | ICD-10-CM | POA: Insufficient documentation

## 2023-04-30 LAB — CBC WITH DIFFERENTIAL (CANCER CENTER ONLY)
Abs Immature Granulocytes: 0.02 10*3/uL (ref 0.00–0.07)
Basophils Absolute: 0 10*3/uL (ref 0.0–0.1)
Basophils Relative: 1 %
Eosinophils Absolute: 0.3 10*3/uL (ref 0.0–0.5)
Eosinophils Relative: 5 %
HCT: 39.9 % (ref 36.0–46.0)
Hemoglobin: 13.5 g/dL (ref 12.0–15.0)
Immature Granulocytes: 0 %
Lymphocytes Relative: 14 %
Lymphs Abs: 1 10*3/uL (ref 0.7–4.0)
MCH: 31.5 pg (ref 26.0–34.0)
MCHC: 33.8 g/dL (ref 30.0–36.0)
MCV: 93 fL (ref 80.0–100.0)
Monocytes Absolute: 0.5 10*3/uL (ref 0.1–1.0)
Monocytes Relative: 8 %
Neutro Abs: 4.9 10*3/uL (ref 1.7–7.7)
Neutrophils Relative %: 72 %
Platelet Count: 227 10*3/uL (ref 150–400)
RBC: 4.29 MIL/uL (ref 3.87–5.11)
RDW: 13.1 % (ref 11.5–15.5)
WBC Count: 6.9 10*3/uL (ref 4.0–10.5)
nRBC: 0 % (ref 0.0–0.2)

## 2023-04-30 LAB — CMP (CANCER CENTER ONLY)
ALT: 15 U/L (ref 0–44)
AST: 18 U/L (ref 15–41)
Albumin: 4 g/dL (ref 3.5–5.0)
Alkaline Phosphatase: 85 U/L (ref 38–126)
Anion gap: 7 (ref 5–15)
BUN: 21 mg/dL (ref 8–23)
CO2: 27 mmol/L (ref 22–32)
Calcium: 9.6 mg/dL (ref 8.9–10.3)
Chloride: 107 mmol/L (ref 98–111)
Creatinine: 1.36 mg/dL — ABNORMAL HIGH (ref 0.44–1.00)
GFR, Estimated: 40 mL/min — ABNORMAL LOW (ref >60)
Glucose, Bld: 82 mg/dL (ref 70–99)
Potassium: 4.2 mmol/L (ref 3.5–5.1)
Sodium: 141 mmol/L (ref 135–145)
Total Bilirubin: 0.7 mg/dL (ref 0.3–1.2)
Total Protein: 6.8 g/dL (ref 6.5–8.1)

## 2023-04-30 MED ORDER — SODIUM CHLORIDE 0.9 % IV SOLN: Freq: Once | INTRAVENOUS | Status: AC

## 2023-04-30 MED ORDER — SODIUM CHLORIDE 0.9 % IV SOLN
1500.0000 mg | Freq: Once | INTRAVENOUS | Status: AC
  Administered 2023-04-30: 1500 mg via INTRAVENOUS
  Filled 2023-04-30: qty 30

## 2023-04-30 NOTE — Progress Notes (Signed)
[facility] [phone]   Fax:([phone]  OFFICE PROGRESS NOTE  Noberto Retort, MD [address] [place] [address]  DIAGNOSIS: Recurrent lung cancer initially diagnosed as stage IA (T1, N0, M0) non-small cell lung cancer, squamous cell carcinoma.  She presented with a right lower lobe lung nodule.  She was diagnosed in June 2021.    PRIOR THERAPY: Right lower lobe superior segmentectomy and lymph node dissection under the care of Dr. Dorris Fetch on 03/17/2020. 2) Concurrent chemoradiation with weekly carboplatin for AUC of 2 and paclitaxel 45 Mg/M2. Last dose on 07/10/22  Status post 6 cycles.   CURRENT THERAPY: Consolidation immunotherapy with Imfinzi 1500 mg IV every 4 weeks.  First dose expected on 08/22/2022.  Status post 9 cycles.  INTERVAL HISTORY: Amy [name] 77 y.o. female returns to the clinic today for follow-up visit accompanied by her husband.  The patient is feeling fine with no concerning complaints except for shortness of breath with exertion with no chest pain, cough or hemoptysis.  She has no nausea, vomiting, diarrhea or constipation.  She has no headache or visual changes.  She has been tolerating her treatment with durvalumab fairly well.  She is here today for evaluation before starting cycle #10.   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  Status post right lower lobe superior segmentectomy    ALLERGIES:  has No Known Allergies.  MEDICATIONS:  Current Outpatient Medications  Medication Sig Dispense Refill   acetaminophen (TYLENOL) 500 MG tablet Take 500-1,000 mg by mouth  every 6 (six) hours as needed for mild pain or headache.     ADVAIR DISKUS 250-50 MCG/ACT AEPB Inhale 1 puff into the lungs 2 (two) times daily.     albuterol (VENTOLIN HFA) 108 (90 Base) MCG/ACT inhaler Inhale 2 puffs into the lungs every 6 (six) hours as needed for wheezing or shortness of breath.     apixaban (ELIQUIS) 5 MG TABS tablet Take 1 tablet (5 mg total) by mouth 2 (two) times daily. 180 tablet 3   diltiazem (CARDIZEM LA) 240 MG 24 hr tablet Take 1 tablet (240 mg total) by mouth daily. 180 tablet 3   pantoprazole (PROTONIX) 40 MG tablet Take 1 tablet (40 mg total) by mouth 2 (two) times daily. 60 tablet 0   prochlorperazine (COMPAZINE) 10 MG tablet Take 1 tablet (10 mg total) by mouth every 6 (six) hours as needed for nausea or vomiting. 30 tablet 0   raloxifene (EVISTA) 60 MG tablet Take 60 mg by mouth daily.     sertraline (ZOLOFT) 50 MG tablet Take 50 mg by mouth daily.     simvastatin (ZOCOR) 10 MG tablet Take 10 mg by mouth at bedtime.     traZODone (DESYREL) 50 MG tablet Take 25 mg by mouth See admin instructions. Take one to two 25 mg half-tablets by mouth as needed at bedtime     No current facility-administered medications for this visit.    SURGICAL HISTORY:  Past Surgical History:  Procedure Laterality Date  APPENDECTOMY     BALLOON DILATION N/A 07/18/2022   Procedure: BALLOON DILATION;  Surgeon: Kerin Salen, MD;  Location: [facility];  Service: Gastroenterology;  Laterality: N/A;   BIOPSY  07/18/2022   Procedure: BIOPSY;  Surgeon: Kerin Salen, MD;  Location: [facility];  Service: Gastroenterology;;   ESOPHAGOGASTRODUODENOSCOPY N/A 07/18/2022   Procedure: ESOPHAGOGASTRODUODENOSCOPY (EGD);  Surgeon: Kerin Salen, MD;  Location: [facility];  Service: Gastroenterology;  Laterality: N/A;   EYE SURGERY     INTERCOSTAL NERVE BLOCK Right 03/17/2020   Procedure: Intercostal Nerve Block;  Surgeon: Loreli Slot, MD;  Location: [facility] OR;  Service: Thoracic;  Laterality:  Right;   LUNG SURGERY Right 03/17/2020   NODE DISSECTION Right 03/17/2020   Procedure: Node Dissection;  Surgeon: Loreli Slot, MD;  Location: [facility] OR;  Service: Thoracic;  Laterality: Right;   TUBAL LIGATION     VIDEO BRONCHOSCOPY WITH ENDOBRONCHIAL ULTRASOUND N/A 05/22/2022   Procedure: VIDEO BRONCHOSCOPY WITH ENDOBRONCHIAL ULTRASOUND;  Surgeon: Loreli Slot, MD;  Location: [facility] OR;  Service: Thoracic;  Laterality: N/A;    REVIEW OF SYSTEMS:  A comprehensive review of systems was negative except for: Respiratory: positive for dyspnea on exertion   PHYSICAL EXAMINATION: General appearance: alert, cooperative, and no distress Head: Normocephalic, without obvious abnormality, atraumatic Neck: no adenopathy, no JVD, supple, symmetrical, trachea midline, and thyroid not enlarged, symmetric, no tenderness/mass/nodules Lymph nodes: Cervical, supraclavicular, and axillary nodes normal. Resp: clear to auscultation bilaterally Back: symmetric, no curvature. ROM normal. No CVA tenderness. Cardio: regular rate and rhythm, S1, S2 normal, no murmur, click, rub or gallop GI: soft, non-tender; bowel sounds normal; no masses,  no organomegaly Extremities: extremities normal, atraumatic, no cyanosis or edema  ECOG PERFORMANCE STATUS: 1 - Symptomatic but completely ambulatory  Blood pressure (!) 140/79, pulse 89, temperature 97.7 F (36.5 C), temperature source Oral, resp. rate 17, height 5\' 3"  (1.6 m), weight 155 lb 1 oz (70.3 kg), SpO2 99%.   LABORATORY DATA: Lab Results  Component Value Date   WBC 6.9 04/30/2023   HGB 13.5 04/30/2023   HCT 39.9 04/30/2023   MCV 93.0 04/30/2023   PLT 227 04/30/2023      Chemistry      Component Value Date/Time   NA 140 04/03/2023 1102   K 4.2 04/03/2023 1102   CL 105 04/03/2023 1102   CO2 28 04/03/2023 1102   BUN 17 04/03/2023 1102   CREATININE 1.12 (H) 04/03/2023 1102      Component Value Date/Time   CALCIUM 9.6 04/03/2023 1102   ALKPHOS  97 04/03/2023 1102   AST 19 04/03/2023 1102   ALT 15 04/03/2023 1102   BILITOT 0.6 04/03/2023 1102       RADIOGRAPHIC STUDIES: CT Chest W Contrast  Result Date: 04/03/2023 CLINICAL DATA:  Non-small cell lung cancer restaging * Tracking Code: BO * EXAM: CT CHEST WITH CONTRAST TECHNIQUE: Multidetector CT imaging of the chest was performed during intravenous contrast administration. RADIATION DOSE REDUCTION: This exam was performed according to the departmental dose-optimization program which includes automated exposure control, adjustment of the mA and/or kV according to patient size and/or use of iterative reconstruction technique. CONTRAST:  75mL OMNIPAQUE IOHEXOL 300 MG/ML  SOLN COMPARISON:  02/02/2023 FINDINGS: Cardiovascular: Aortic atherosclerosis. Normal heart size. Three-vessel coronary artery calcifications. No pericardial effusion. Mediastinum/Nodes: No enlarged mediastinal, hilar, or axillary lymph nodes. Thyroid gland, trachea, and esophagus demonstrate no significant findings. Lungs/Pleura: Moderate centrilobular and paraseptal emphysema. Unchanged postoperative/post treatment appearance of the right chest probable  right lower lobectomy with fibrotic consolidation and volume loss of the remaining perihilar right lung (series 8, image 88). Multiple new bilateral pulmonary nodules, including a 0.4 cm subpleural nodule of the medial superior segment left lower lobe (series 8, image 46), a 0.3 cm nodule in the dependent left lower lobe (series 8, image 82) and a 0.3 cm nodule of the posterior lingula (series 8, image 74). New nodule in the inferior right upper lobe measuring 0.3 cm (series 8, image 90) and in the peripheral right upper lobe measuring 0.6 cm (series 8, image 73). Other small nodules unchanged. No pleural effusion or pneumothorax. Upper Abdomen: No acute abnormality. Musculoskeletal: No chest wall abnormality. No acute osseous findings. IMPRESSION: 1. Unchanged postoperative/post  treatment appearance of the right chest, probable right lower lobectomy with fibrotic consolidation and volume loss of the remaining perihilar right lung. 2. Multiple new small bilateral pulmonary nodules, in addition to nodules newly seen on prior examination, concerning for pulmonary metastases although possibly infectious or inflammatory. 3. Emphysema. 4. Coronary artery disease. Aortic Atherosclerosis (ICD10-I70.0) and Emphysema (ICD10-J43.9). Electronically Signed   By: Jearld Lesch M.D.   On: 04/03/2023 10:19    ASSESSMENT AND PLAN: This is a very pleasant 77 years old white female with suspicious recurrent non-small cell lung cancer initially diagnosed as history of stage IA (T1 a, N0, M0) non-small cell lung cancer, squamous cell carcinoma presented with right lower lobe lung nodule diagnosed in June 2021 status post right lower lobe superior segmentectomy with lymph node dissection under the care of Dr. Dorris Fetch on March 17, 2020.  The patient has evidence for disease recurrence in July 2023. The patient is feeling fine today with no concerning complaints except for the back pain. She had repeat CT scan of the chest performed recently.  I personally and independently reviewed the scan images and discussed the results with the patient today. Her scan showed mild increase in the right infrahilar soft tissue encasing the bronchus intermedius and disease recurrence could not be completely excluded.  There was also new mild bilateral lower paratracheal lymphadenopathy that is nonspecific and nodal metastasis could not be completely excluded. The patient had a PET scan performed recently and that showed intensely hypermetabolic recurrence in the right infrahilar region with mildly prominent left paratracheal and paraesophageal lymph nodes also mildly hypermetabolic for size and suspicious for nodal metastasis but no distant metastatic disease. The patient underwent repeat bronchoscopy with EBUS under the  care of Dr. Dorris Fetch and the final pathology was consistent with recurrent squamous cell carcinoma of the lung.  MRI of the brain was negative for malignancy. She underwent treatment with concurrent chemoradiation with weekly carboplatin for AUC of 2 and paclitaxel 45 Mg/M2.  First dose today June 05, 2022.  Status post 6 cycles.  She has partial response. The patient is currently undergoing consolidation treatment with immunotherapy with Imfinzi 1500 Mg IV every 4 weeks status post 9 cycles.   Patient has been tolerating this treatment well with no concerning adverse effects. I recommended for her to proceed with cycle #10 today as planned. I will see her back for follow-up visit in 4 weeks for evaluation with repeat CT scan of the chest for restaging of her disease and the evaluation of the pulmonary nodule seen on the previous imaging studies a month ago. The patient was advised to call immediately if she has any other concerning symptoms in the interval. The patient voices understanding of current disease status and treatment options and  is in agreement with the current care plan.  All questions were answered. The patient knows to call the clinic with any problems, questions or concerns. We can certainly see the patient much sooner if necessary. The total time spent in the appointment was 20 minutes.   Disclaimer: This note was dictated with voice recognition software. Similar sounding words can inadvertently be transcribed and may not be corrected upon review.

## 2023-04-30 NOTE — Patient Instructions (Signed)

## 2023-05-15 ENCOUNTER — Telehealth: Payer: Self-pay

## 2023-05-15 NOTE — Telephone Encounter (Signed)
Spoke with Amy [name] informing her that the [facility] and [employer] Claim form had been completed, but we needed a signed information release form completed. Per patient request, release form and claims form place up front to be signed and picked up at next visit on 05/25/23. Informed Amy [name] she would also need copies of itemized bills from billing department to be sent along with claim.

## 2023-05-25 ENCOUNTER — Ambulatory Visit (HOSPITAL_COMMUNITY)
Admission: RE | Admit: 2023-05-25 | Discharge: 2023-05-25 | Disposition: A | Discharge status: Home / Self Care | Payer: Medicare HMO | Source: Ambulatory Visit | Attending: Internal Medicine | Admitting: Internal Medicine

## 2023-05-25 DIAGNOSIS — C3490 Malignant neoplasm of unspecified part of unspecified bronchus or lung: Secondary | ICD-10-CM | POA: Insufficient documentation

## 2023-05-25 DIAGNOSIS — J432 Centrilobular emphysema: Secondary | ICD-10-CM | POA: Diagnosis not present

## 2023-05-25 MED ORDER — IOHEXOL 300 MG/ML  SOLN
75.0000 mL | Freq: Once | INTRAMUSCULAR | Status: AC | PRN
  Administered 2023-05-25: 65 mL via INTRAVENOUS

## 2023-05-29 ENCOUNTER — Encounter: Payer: Self-pay | Admitting: Internal Medicine

## 2023-05-29 ENCOUNTER — Inpatient Hospital Stay: Payer: Medicare HMO

## 2023-05-29 ENCOUNTER — Other Ambulatory Visit: Payer: Self-pay

## 2023-05-29 ENCOUNTER — Inpatient Hospital Stay: Payer: Medicare HMO | Attending: Internal Medicine

## 2023-05-29 ENCOUNTER — Inpatient Hospital Stay (HOSPITAL_BASED_OUTPATIENT_CLINIC_OR_DEPARTMENT_OTHER): Payer: Medicare HMO | Admitting: Internal Medicine

## 2023-05-29 VITALS — BP 128/68 | HR 78 | Temp 98.2°F | Resp 20

## 2023-05-29 DIAGNOSIS — Z79899 Other long term (current) drug therapy: Secondary | ICD-10-CM | POA: Diagnosis not present

## 2023-05-29 DIAGNOSIS — Z7901 Long term (current) use of anticoagulants: Secondary | ICD-10-CM | POA: Insufficient documentation

## 2023-05-29 DIAGNOSIS — C3431 Malignant neoplasm of lower lobe, right bronchus or lung: Secondary | ICD-10-CM

## 2023-05-29 DIAGNOSIS — Z5112 Encounter for antineoplastic immunotherapy: Secondary | ICD-10-CM | POA: Diagnosis not present

## 2023-05-29 DIAGNOSIS — Z7951 Long term (current) use of inhaled steroids: Secondary | ICD-10-CM | POA: Insufficient documentation

## 2023-05-29 LAB — CMP (CANCER CENTER ONLY)
ALT: 16 U/L (ref 0–44)
AST: 18 U/L (ref 15–41)
Albumin: 4.2 g/dL (ref 3.5–5.0)
Alkaline Phosphatase: 90 U/L (ref 38–126)
Anion gap: 7 (ref 5–15)
BUN: 24 mg/dL — ABNORMAL HIGH (ref 8–23)
CO2: 28 mmol/L (ref 22–32)
Calcium: 9.3 mg/dL (ref 8.9–10.3)
Chloride: 105 mmol/L (ref 98–111)
Creatinine: 1.52 mg/dL — ABNORMAL HIGH (ref 0.44–1.00)
GFR, Estimated: 35 mL/min — ABNORMAL LOW (ref >60)
Glucose, Bld: 82 mg/dL (ref 70–99)
Potassium: 4.2 mmol/L (ref 3.5–5.1)
Sodium: 140 mmol/L (ref 135–145)
Total Bilirubin: 0.6 mg/dL (ref 0.3–1.2)
Total Protein: 7.1 g/dL (ref 6.5–8.1)

## 2023-05-29 LAB — CBC WITH DIFFERENTIAL (CANCER CENTER ONLY)
Abs Immature Granulocytes: 0.02 10*3/uL (ref 0.00–0.07)
Basophils Absolute: 0 10*3/uL (ref 0.0–0.1)
Basophils Relative: 1 %
Eosinophils Absolute: 0.3 10*3/uL (ref 0.0–0.5)
Eosinophils Relative: 4 %
HCT: 40.9 % (ref 36.0–46.0)
Hemoglobin: 13.6 g/dL (ref 12.0–15.0)
Immature Granulocytes: 0 %
Lymphocytes Relative: 15 %
Lymphs Abs: 1.1 10*3/uL (ref 0.7–4.0)
MCH: 31.5 pg (ref 26.0–34.0)
MCHC: 33.3 g/dL (ref 30.0–36.0)
MCV: 94.7 fL (ref 80.0–100.0)
Monocytes Absolute: 0.6 10*3/uL (ref 0.1–1.0)
Monocytes Relative: 8 %
Neutro Abs: 5.5 10*3/uL (ref 1.7–7.7)
Neutrophils Relative %: 72 %
Platelet Count: 221 10*3/uL (ref 150–400)
RBC: 4.32 MIL/uL (ref 3.87–5.11)
RDW: 13.1 % (ref 11.5–15.5)
WBC Count: 7.6 10*3/uL (ref 4.0–10.5)
nRBC: 0 % (ref 0.0–0.2)

## 2023-05-29 MED ORDER — SODIUM CHLORIDE 0.9 % IV SOLN
1500.0000 mg | Freq: Once | INTRAVENOUS | Status: AC
  Administered 2023-05-29: 1500 mg via INTRAVENOUS
  Filled 2023-05-29: qty 30

## 2023-05-29 MED ORDER — SODIUM CHLORIDE 0.9 % IV SOLN: Freq: Once | INTRAVENOUS | Status: AC

## 2023-05-29 NOTE — Patient Instructions (Signed)
[facility]  Discharge Instructions: Thank you for choosing [facility] to provide your oncology and hematology care.   If you have a lab appointment with the Cancer Center, please go directly to the Cancer Center and check in at the registration area.   Wear comfortable clothing and clothing appropriate for easy access to any Portacath or PICC line.   We strive to give you quality time with your provider. You may need to reschedule your appointment if you arrive late (15 or more minutes).  Arriving late affects you and other patients whose appointments are after yours.  Also, if you miss three or more appointments without notifying the office, you may be dismissed from the clinic at the provider's discretion.      For prescription refill requests, have your pharmacy contact our office and allow 72 hours for refills to be completed.    Today you received the following chemotherapy and/or immunotherapy agent: Durvalumab (Imfinzi)    To help prevent nausea and vomiting after your treatment, we encourage you to take your nausea medication as directed.  BELOW ARE SYMPTOMS THAT SHOULD BE REPORTED IMMEDIATELY: *FEVER GREATER THAN 100.4 F (38 C) OR HIGHER *CHILLS OR SWEATING *NAUSEA AND VOMITING THAT IS NOT CONTROLLED WITH YOUR NAUSEA MEDICATION *UNUSUAL SHORTNESS OF BREATH *UNUSUAL BRUISING OR BLEEDING *URINARY PROBLEMS (pain or burning when urinating, or frequent urination) *BOWEL PROBLEMS (unusual diarrhea, constipation, pain near the anus) TENDERNESS IN MOUTH AND THROAT WITH OR WITHOUT PRESENCE OF ULCERS (sore throat, sores in mouth, or a toothache) UNUSUAL RASH, SWELLING OR PAIN  UNUSUAL VAGINAL DISCHARGE OR ITCHING   Items with * indicate a potential emergency and should be followed up as soon as possible or go to the Emergency Department if any problems should occur.  Please show the CHEMOTHERAPY ALERT CARD or IMMUNOTHERAPY ALERT CARD  at check-in to the Emergency Department and triage nurse.  Should you have questions after your visit or need to cancel or reschedule your appointment, please contact [facility]  Dept: [phone]  and follow the prompts.  Office hours are 8:00 a.m. to 4:30 p.m. Monday - Friday. Please note that voicemails left after 4:00 p.m. may not be returned until the following business day.  We are closed weekends and major holidays. You have access to a nurse at all times for urgent questions. Please call the main number to the clinic Dept: [phone] and follow the prompts.   For any non-urgent questions, you may also contact your provider using MyChart. We now offer e-Visits for anyone 18 and older to request care online for non-urgent symptoms. For details visit [email or web address].   Also download the MyChart app! Go to the app store, search "MyChart", open the app, select [facility], and log in with your MyChart username and password.  Durvalumab Injection What is this medication? DURVALUMAB (dur VAL ue mab) treats some types of cancer. It works by helping your immune system slow or stop the spread of cancer cells. It is a monoclonal antibody. This medicine may be used for other purposes; ask your health care provider or pharmacist if you have questions. COMMON BRAND NAME(S): IMFINZI What should I tell my care team before I take this medication? They need to know if you have any of these conditions: Allogeneic stem cell transplant (uses someone else's stem cells) Autoimmune diseases, such as Crohn disease, ulcerative colitis, lupus History of chest radiation Nervous system problems,   such as Guillain-Barre syndrome, myasthenia gravis Organ transplant An unusual or allergic reaction to durvalumab, other medications, foods, dyes, or preservatives Pregnant or trying to get pregnant Breast-feeding How should I use this medication? This medication is  infused into a vein. It is given by your care team in a hospital or clinic setting. A special MedGuide will be given to you before each treatment. Be sure to read this information carefully each time. Talk to your care team about the use of this medication in children. Special care may be needed. Overdosage: If you think you have taken too much of this medicine contact a poison control center or emergency room at once. NOTE: This medicine is only for you. Do not share this medicine with others. What if I miss a dose? Keep appointments for follow-up doses. It is important not to miss your dose. Call your care team if you are unable to keep an appointment. What may interact with this medication? Interactions have not been studied. This list may not describe all possible interactions. Give your health care provider a list of all the medicines, herbs, non-prescription drugs, or dietary supplements you use. Also tell them if you smoke, drink alcohol, or use illegal drugs. Some items may interact with your medicine. What should I watch for while using this medication? Your condition will be monitored carefully while you are receiving this medication. You may need blood work while taking this medication. This medication may cause serious skin reactions. They can happen weeks to months after starting the medication. Contact your care team right away if you notice fevers or flu-like symptoms with a rash. The rash may be red or purple and then turn into blisters or peeling of the skin. You may also notice a red rash with swelling of the face, lips, or lymph nodes in your neck or under your arms. Tell your care team right away if you have any change in your eyesight. Talk to your care team if you may be pregnant. Serious birth defects can occur if you take this medication during pregnancy and for 3 months after the last dose. You will need a negative pregnancy test before starting this medication. Contraception  is recommended while taking this medication and for 3 months after the last dose. Your care team can help you find the option that works for you. Do not breastfeed while taking this medication and for 3 months after the last dose. What side effects may I notice from receiving this medication? Side effects that you should report to your care team as soon as possible: Allergic reactions--skin rash, itching, hives, swelling of the face, lips, tongue, or throat Dry cough, shortness of breath or trouble breathing Eye pain, redness, irritation, or discharge with blurry or decreased vision Heart muscle inflammation--unusual weakness or fatigue, shortness of breath, chest pain, fast or irregular heartbeat, dizziness, swelling of the ankles, feet, or hands Hormone gland problems--headache, sensitivity to light, unusual weakness or fatigue, dizziness, fast or irregular heartbeat, increased sensitivity to cold or heat, excessive sweating, constipation, hair loss, increased thirst or amount of urine, tremors or shaking, irritability Infusion reactions--chest pain, shortness of breath or trouble breathing, feeling faint or lightheaded Kidney injury (glomerulonephritis)--decrease in the amount of urine, red or dark brown urine, foamy or bubbly urine, swelling of the ankles, hands, or feet Liver injury--right upper belly pain, loss of appetite, nausea, light-colored stool, dark yellow or brown urine, yellowing skin or eyes, unusual weakness or fatigue Pain, tingling, or numbness in   the hands or feet, muscle weakness, change in vision, confusion or trouble speaking, loss of balance or coordination, trouble walking, seizures Rash, fever, and swollen lymph nodes Redness, blistering, peeling, or loosening of the skin, including inside the mouth Sudden or severe stomach pain, bloody diarrhea, fever, nausea, vomiting Side effects that usually do not require medical attention (report these to your care team if they  continue or are bothersome): Bone, joint, or muscle pain Diarrhea Fatigue Loss of appetite Nausea Skin rash This list may not describe all possible side effects. Call your doctor for medical advice about side effects. You may report side effects to FDA at 1-800-FDA-1088. Where should I keep my medication? This medication is given in a hospital or clinic. It will not be stored at home. NOTE: This sheet is a summary. It may not cover all possible information. If you have questions about this medicine, talk to your doctor, pharmacist, or health care provider.  2024 Elsevier/Gold Standard (2022-02-14 00:00:00)   

## 2023-05-29 NOTE — Progress Notes (Signed)
Per Dr. Arbutus Ped, ok for treatment today with serum creatine 1.52 mg/dL

## 2023-05-29 NOTE — Progress Notes (Signed)
[facility], [facility] Telephone:([phone]   Fax:(336) [phone]  OFFICE PROGRESS NOTE  Noberto Retort, MD 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  She presented with a right lower lobe lung nodule.  She was diagnosed in June 2021.    PRIOR THERAPY: Right lower lobe superior segmentectomy and lymph node dissection under the care of Dr. Dorris Fetch on 03/17/2020. 2) Concurrent chemoradiation with weekly carboplatin for AUC of 2 and paclitaxel 45 Mg/M2. Last dose on 07/10/22  Status post 6 cycles.   CURRENT THERAPY: Consolidation immunotherapy with Imfinzi 1500 mg IV every 4 weeks.  First dose expected on 08/22/2022.  Status post 10 cycles.  INTERVAL HISTORY: Amy [name] 77 y.o. female returns to the clinic today for follow-up visit.  The patient is feeling fine today with no concerning complaints.  She denied having any current chest pain, shortness of breath, cough or hemoptysis.  She has no nausea, vomiting, diarrhea or constipation.  She has no headache or visual changes.  She denied having any significant weight loss or night sweats.  She has been tolerating her treatment with immunotherapy fairly well.  She is here today for evaluation with repeat CT scan of the chest before starting cycle #11 of her treatment.  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  Status post right lower lobe superior segmentectomy    ALLERGIES:  has No Known Allergies.  MEDICATIONS:  Current Outpatient Medications   Medication Sig Dispense Refill   acetaminophen (TYLENOL) 500 MG tablet Take 500-1,000 mg by mouth every 6 (six) hours as needed for mild pain or headache.     ADVAIR DISKUS 250-50 MCG/ACT AEPB Inhale 1 puff into the lungs 2 (two) times daily.     albuterol (VENTOLIN HFA) 108 (90 Base) MCG/ACT inhaler Inhale 2 puffs into the lungs every 6 (six) hours as needed for wheezing or shortness of breath.     apixaban (ELIQUIS) 5 MG TABS tablet Take 1 tablet (5 mg total) by mouth 2 (two) times daily. 180 tablet 3   diltiazem (CARDIZEM LA) 240 MG 24 hr tablet Take 1 tablet (240 mg total) by mouth daily. 180 tablet 3   pantoprazole (PROTONIX) 40 MG tablet Take 1 tablet (40 mg total) by mouth 2 (two) times daily. 60 tablet 0   prochlorperazine (COMPAZINE) 10 MG tablet Take 1 tablet (10 mg total) by mouth every 6 (six) hours as needed for nausea or vomiting. 30 tablet 0   raloxifene (EVISTA) 60 MG tablet Take 60 mg by mouth daily.     sertraline (ZOLOFT) 50 MG tablet Take 50 mg by mouth daily.     simvastatin (ZOCOR) 10 MG tablet Take 10 mg by mouth at bedtime.     traZODone (DESYREL) 50 MG tablet Take 25 mg by mouth See admin instructions. Take one to two 25 mg half-tablets by mouth as needed at bedtime     No current facility-administered medications for  this visit.    SURGICAL HISTORY:  Past Surgical History:  Procedure Laterality Date   APPENDECTOMY     BALLOON DILATION N/A 07/18/2022   Procedure: BALLOON DILATION;  Surgeon: Kerin Salen, MD;  Location: [facility];  Service: Gastroenterology;  Laterality: N/A;   BIOPSY  07/18/2022   Procedure: BIOPSY;  Surgeon: Kerin Salen, MD;  Location: [facility];  Service: Gastroenterology;;   ESOPHAGOGASTRODUODENOSCOPY N/A 07/18/2022   Procedure: ESOPHAGOGASTRODUODENOSCOPY (EGD);  Surgeon: Kerin Salen, MD;  Location: [facility];  Service: Gastroenterology;  Laterality: N/A;   EYE SURGERY     INTERCOSTAL NERVE BLOCK Right 03/17/2020   Procedure: Intercostal  Nerve Block;  Surgeon: Loreli Slot, MD;  Location: [facility] OR;  Service: Thoracic;  Laterality: Right;   LUNG SURGERY Right 03/17/2020   NODE DISSECTION Right 03/17/2020   Procedure: Node Dissection;  Surgeon: Loreli Slot, MD;  Location: [facility] OR;  Service: Thoracic;  Laterality: Right;   TUBAL LIGATION     VIDEO BRONCHOSCOPY WITH ENDOBRONCHIAL ULTRASOUND N/A 05/22/2022   Procedure: VIDEO BRONCHOSCOPY WITH ENDOBRONCHIAL ULTRASOUND;  Surgeon: Loreli Slot, MD;  Location: [facility] OR;  Service: Thoracic;  Laterality: N/A;    REVIEW OF SYSTEMS:  Constitutional: negative Eyes: negative Ears, nose, mouth, throat, and face: negative Respiratory: negative Cardiovascular: negative Gastrointestinal: negative Genitourinary:negative Integument/breast: negative Hematologic/lymphatic: negative Musculoskeletal:negative Neurological: negative Behavioral/Psych: negative Endocrine: negative Allergic/Immunologic: negative   PHYSICAL EXAMINATION: General appearance: alert, cooperative, and no distress Head: Normocephalic, without obvious abnormality, atraumatic Neck: no adenopathy, no JVD, supple, symmetrical, trachea midline, and thyroid not enlarged, symmetric, no tenderness/mass/nodules Lymph nodes: Cervical, supraclavicular, and axillary nodes normal. Resp: clear to auscultation bilaterally Back: symmetric, no curvature. ROM normal. No CVA tenderness. Cardio: regular rate and rhythm, S1, S2 normal, no murmur, click, rub or gallop GI: soft, non-tender; bowel sounds normal; no masses,  no organomegaly Extremities: extremities normal, atraumatic, no cyanosis or edema Neurologic: Alert and oriented X 3, normal strength and tone. Normal symmetric reflexes. Normal coordination and gait  ECOG PERFORMANCE STATUS: 1 - Symptomatic but completely ambulatory  Blood pressure (!) 142/73, pulse 80, temperature 97.7 F (36.5 C), temperature source Oral, resp. rate 17, height 5\' 3"  (1.6 m), weight  157 lb 1.6 oz (71.3 kg), SpO2 99%.   LABORATORY DATA: Lab Results  Component Value Date   WBC 7.6 05/29/2023   HGB 13.6 05/29/2023   HCT 40.9 05/29/2023   MCV 94.7 05/29/2023   PLT 221 05/29/2023      Chemistry      Component Value Date/Time   NA 141 04/30/2023 0937   K 4.2 04/30/2023 0937   CL 107 04/30/2023 0937   CO2 27 04/30/2023 0937   BUN 21 04/30/2023 0937   CREATININE 1.36 (H) 04/30/2023 0937      Component Value Date/Time   CALCIUM 9.6 04/30/2023 0937   ALKPHOS 85 04/30/2023 0937   AST 18 04/30/2023 0937   ALT 15 04/30/2023 0937   BILITOT 0.7 04/30/2023 0937       RADIOGRAPHIC STUDIES: CT Chest W Contrast  Result Date: 05/29/2023 CLINICAL DATA:  Non-small cell lung cancer restaging * Tracking Code: BO * EXAM: CT CHEST WITH CONTRAST TECHNIQUE: Multidetector CT imaging of the chest was performed during intravenous contrast administration. RADIATION DOSE REDUCTION: This exam was performed according to the departmental dose-optimization program which includes automated exposure control, adjustment of the mA and/or kV according to patient size and/or use of iterative reconstruction technique. CONTRAST:  65mL OMNIPAQUE IOHEXOL 300 MG/ML  SOLN  COMPARISON:  04/02/2023 FINDINGS: Cardiovascular: Coronary, aortic arch, and branch vessel atherosclerotic vascular disease. Mediastinum/Nodes: Diffuse mild wall thickening in the esophagus favoring nonspecific esophagitis. This is similar to prior. Small mediastinal lymph nodes are not pathologically enlarged. Lungs/Pleura: Centrilobular emphysema. Biapical pleuroparenchymal scarring. Scattered wedge resection clips in the lobes the right lung perihilar and medial right lower lobe volume loss, likely primarily therapy related. There are few scattered nodules under 5 mm in diameter, some subsolid and some solid, generally similar to the prior examination. In addition, a 6 by 5 mm left upper lobe peribronchovascular nodule on image 39 series  8 is stable. A right upper lobe nodule on image 79 of series 8 appears less solid than on the prior exam although this may be from slice selection, and measures about 6 by 4 mm on image 79 series 8. No significant new nodules or progression. Mild airway thickening bilaterally. Upper Abdomen: Unremarkable Musculoskeletal: Mild upper and midthoracic spondylosis and lower cervical spondylosis. IMPRESSION: 1. Stable appearance of the chest, with a few scattered small pulmonary nodules, some of which are subsolid and some of which are solid. No new or progressive findings. 2. Diffuse mild wall thickening in the esophagus favoring nonspecific esophagitis. 3. Therapy related findings in the right lung. 4. Coronary and aortic atherosclerosis. Aortic Atherosclerosis (ICD10-I70.0) and Emphysema (ICD10-J43.9). Electronically Signed   By: Gaylyn Rong M.D.   On: 05/29/2023 09:18    ASSESSMENT AND PLAN: This is a very pleasant 77 years old white female with suspicious recurrent non-small cell lung cancer initially diagnosed as history of stage IA (T1 a, N0, M0) non-small cell lung cancer, squamous cell carcinoma presented with right lower lobe lung nodule diagnosed in June 2021 status post right lower lobe superior segmentectomy with lymph node dissection under the care of Dr. Dorris Fetch on March 17, 2020.  The patient has evidence for disease recurrence in July 2023. The patient is feeling fine today with no concerning complaints except for the back pain. She had repeat CT scan of the chest performed recently.  I personally and independently reviewed the scan images and discussed the results with the patient today. Her scan showed mild increase in the right infrahilar soft tissue encasing the bronchus intermedius and disease recurrence could not be completely excluded.  There was also new mild bilateral lower paratracheal lymphadenopathy that is nonspecific and nodal metastasis could not be completely excluded. The  patient had a PET scan performed recently and that showed intensely hypermetabolic recurrence in the right infrahilar region with mildly prominent left paratracheal and paraesophageal lymph nodes also mildly hypermetabolic for size and suspicious for nodal metastasis but no distant metastatic disease. The patient underwent repeat bronchoscopy with EBUS under the care of Dr. Dorris Fetch and the final pathology was consistent with recurrent squamous cell carcinoma of the lung.  MRI of the brain was negative for malignancy. She underwent treatment with concurrent chemoradiation with weekly carboplatin for AUC of 2 and paclitaxel 45 Mg/M2.  First dose today June 05, 2022.  Status post 6 cycles.  She has partial response. The patient is currently undergoing consolidation treatment with immunotherapy with Imfinzi 1500 Mg IV every 4 weeks status post 10 cycles.   She has been tolerating this treatment well with no concerning adverse effects. She had repeat CT scan of the chest performed recently.  The final report is still pending but I personally and independently reviewed the scan and I did not see significant change from the previous scan in June 2024  except for some decrease on the previously noted pulmonary nodules. I recommended for her to continue her current treatment with immunotherapy and she will proceed with cycle #11 today but if there is any concerning findings on the pending scan report, I will inform the patient and give her additional recommendation. She will come back for follow-up visit in 4 weeks for evaluation before the next cycle of her treatment. She was advised to call immediately if she has any other concerning symptoms in the interval. The patient voices understanding of current disease status and treatment options and is in agreement with the current care plan.  All questions were answered. The patient knows to call the clinic with any problems, questions or concerns. We can  certainly see the patient much sooner if necessary. The total time spent in the appointment was 30 minutes.   Disclaimer: This note was dictated with voice recognition software. Similar sounding words can inadvertently be transcribed and may not be corrected upon review.

## 2023-06-26 ENCOUNTER — Inpatient Hospital Stay (HOSPITAL_BASED_OUTPATIENT_CLINIC_OR_DEPARTMENT_OTHER): Payer: Medicare HMO | Admitting: Internal Medicine

## 2023-06-26 ENCOUNTER — Inpatient Hospital Stay: Payer: Medicare HMO | Attending: Internal Medicine

## 2023-06-26 ENCOUNTER — Inpatient Hospital Stay: Payer: Medicare HMO

## 2023-06-26 VITALS — BP 132/74 | HR 81 | Temp 98.4°F | Resp 16

## 2023-06-26 DIAGNOSIS — C3431 Malignant neoplasm of lower lobe, right bronchus or lung: Secondary | ICD-10-CM | POA: Insufficient documentation

## 2023-06-26 DIAGNOSIS — Z79899 Other long term (current) drug therapy: Secondary | ICD-10-CM | POA: Diagnosis not present

## 2023-06-26 DIAGNOSIS — Z923 Personal history of irradiation: Secondary | ICD-10-CM | POA: Insufficient documentation

## 2023-06-26 DIAGNOSIS — Z5112 Encounter for antineoplastic immunotherapy: Secondary | ICD-10-CM | POA: Diagnosis not present

## 2023-06-26 LAB — TSH: TSH: 3.486 u[IU]/mL (ref 0.350–4.500)

## 2023-06-26 LAB — CMP (CANCER CENTER ONLY)
ALT: 17 U/L (ref 0–44)
AST: 19 U/L (ref 15–41)
Albumin: 4 g/dL (ref 3.5–5.0)
Alkaline Phosphatase: 92 U/L (ref 38–126)
Anion gap: 8 (ref 5–15)
BUN: 15 mg/dL (ref 8–23)
CO2: 28 mmol/L (ref 22–32)
Calcium: 9.7 mg/dL (ref 8.9–10.3)
Chloride: 105 mmol/L (ref 98–111)
Creatinine: 1.35 mg/dL — ABNORMAL HIGH (ref 0.44–1.00)
GFR, Estimated: 40 mL/min — ABNORMAL LOW
Glucose, Bld: 100 mg/dL — ABNORMAL HIGH (ref 70–99)
Potassium: 3.8 mmol/L (ref 3.5–5.1)
Sodium: 141 mmol/L (ref 135–145)
Total Bilirubin: 0.9 mg/dL (ref 0.3–1.2)
Total Protein: 7 g/dL (ref 6.5–8.1)

## 2023-06-26 LAB — CBC WITH DIFFERENTIAL (CANCER CENTER ONLY)
Abs Immature Granulocytes: 0.02 K/uL (ref 0.00–0.07)
Basophils Absolute: 0 K/uL (ref 0.0–0.1)
Basophils Relative: 1 %
Eosinophils Absolute: 0.2 K/uL (ref 0.0–0.5)
Eosinophils Relative: 3 %
HCT: 40.2 % (ref 36.0–46.0)
Hemoglobin: 13.5 g/dL (ref 12.0–15.0)
Immature Granulocytes: 0 %
Lymphocytes Relative: 14 %
Lymphs Abs: 1.1 K/uL (ref 0.7–4.0)
MCH: 31.5 pg (ref 26.0–34.0)
MCHC: 33.6 g/dL (ref 30.0–36.0)
MCV: 93.9 fL (ref 80.0–100.0)
Monocytes Absolute: 0.6 K/uL (ref 0.1–1.0)
Monocytes Relative: 7 %
Neutro Abs: 5.9 K/uL (ref 1.7–7.7)
Neutrophils Relative %: 75 %
Platelet Count: 241 K/uL (ref 150–400)
RBC: 4.28 MIL/uL (ref 3.87–5.11)
RDW: 13 % (ref 11.5–15.5)
WBC Count: 7.8 K/uL (ref 4.0–10.5)
nRBC: 0 % (ref 0.0–0.2)

## 2023-06-26 MED ORDER — SODIUM CHLORIDE 0.9 % IV SOLN
1500.0000 mg | Freq: Once | INTRAVENOUS | Status: AC
  Administered 2023-06-26: 1500 mg via INTRAVENOUS
  Filled 2023-06-26: qty 30

## 2023-06-26 MED ORDER — SODIUM CHLORIDE 0.9 % IV SOLN: Freq: Once | INTRAVENOUS | Status: AC

## 2023-06-26 NOTE — Patient Instructions (Signed)
[facility]  Discharge Instructions: Thank you for choosing [facility] to provide your oncology and hematology care.   If you have a lab appointment with the Cancer Center, please go directly to the Cancer Center and check in at the registration area.   Wear comfortable clothing and clothing appropriate for easy access to any Portacath or PICC line.   We strive to give you quality time with your provider. You may need to reschedule your appointment if you arrive late (15 or more minutes).  Arriving late affects you and other patients whose appointments are after yours.  Also, if you miss three or more appointments without notifying the office, you may be dismissed from the clinic at the provider's discretion.      For prescription refill requests, have your pharmacy contact our office and allow 72 hours for refills to be completed.    Today you received the following chemotherapy and/or immunotherapy agent: Durvalumab (Imfinzi)   To help prevent nausea and vomiting after your treatment, we encourage you to take your nausea medication as directed.  BELOW ARE SYMPTOMS THAT SHOULD BE REPORTED IMMEDIATELY: *FEVER GREATER THAN 100.4 F (38 C) OR HIGHER *CHILLS OR SWEATING *NAUSEA AND VOMITING THAT IS NOT CONTROLLED WITH YOUR NAUSEA MEDICATION *UNUSUAL SHORTNESS OF BREATH *UNUSUAL BRUISING OR BLEEDING *URINARY PROBLEMS (pain or burning when urinating, or frequent urination) *BOWEL PROBLEMS (unusual diarrhea, constipation, pain near the anus) TENDERNESS IN MOUTH AND THROAT WITH OR WITHOUT PRESENCE OF ULCERS (sore throat, sores in mouth, or a toothache) UNUSUAL RASH, SWELLING OR PAIN  UNUSUAL VAGINAL DISCHARGE OR ITCHING   Items with * indicate a potential emergency and should be followed up as soon as possible or go to the Emergency Department if any problems should occur.  Please show the CHEMOTHERAPY ALERT CARD or IMMUNOTHERAPY ALERT CARD  at check-in to the Emergency Department and triage nurse.  Should you have questions after your visit or need to cancel or reschedule your appointment, please contact [place] [facility]  Dept: [phone]  and follow the prompts.  Office hours are 8:00 a.m. to 4:30 p.m. Monday - Friday. Please note that voicemails left after 4:00 p.m. may not be returned until the following business day.  We are closed weekends and major holidays. You have access to a nurse at all times for urgent questions. Please call the main number to the clinic Dept: [phone] and follow the prompts.   For any non-urgent questions, you may also contact your provider using MyChart. We now offer e-Visits for anyone 4 and older to request care online for non-urgent symptoms. For details visit [email or web address].   Also download the MyChart app! Go to the app store, search "MyChart", open the app, select [place], and log in with your MyChart username and password.

## 2023-06-26 NOTE — Progress Notes (Signed)
[facility] Telephone:([phone]   Fax:(336) [phone]  OFFICE PROGRESS NOTE  Noberto Retort, MD [address] [place] [address]  DIAGNOSIS: Recurrent lung cancer initially diagnosed as stage IA (T1, N0, M0) non-small cell lung cancer, squamous cell carcinoma.  She presented with a right lower lobe lung nodule.  She was diagnosed in June 2021.    PRIOR THERAPY: Right lower lobe superior segmentectomy and lymph node dissection under the care of Dr. Dorris Fetch on 03/17/2020. 2) Concurrent chemoradiation with weekly carboplatin for AUC of 2 and paclitaxel 45 Mg/M2. Last dose on 07/10/22  Status post 6 cycles.   CURRENT THERAPY: Consolidation immunotherapy with Imfinzi 1500 mg IV every 4 weeks.  First dose expected on 08/22/2022.  Status post 11 cycles.  INTERVAL HISTORY: Amy [name] 77 y.o. female returns to the clinic today for follow-up visit.  The patient is feeling fine today with no concerning complaints.  She has been tolerating her treatment with immunotherapy fairly well.  She denied having any current chest pain, shortness of breath, cough or hemoptysis.  She has no nausea, vomiting, diarrhea or constipation.  She has no headache or visual changes.  She denied having any weight loss or night sweats.  She is here today for evaluation before starting cycle #12 of her treatment.  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  Status post right lower lobe superior segmentectomy    ALLERGIES:  has No Known Allergies.  MEDICATIONS:  Current Outpatient Medications  Medication Sig Dispense Refill   acetaminophen  (TYLENOL) 500 MG tablet Take 500-1,000 mg by mouth every 6 (six) hours as needed for mild pain or headache.     ADVAIR DISKUS 250-50 MCG/ACT AEPB Inhale 1 puff into the lungs 2 (two) times daily.     albuterol (VENTOLIN HFA) 108 (90 Base) MCG/ACT inhaler Inhale 2 puffs into the lungs every 6 (six) hours as needed for wheezing or shortness of breath.     apixaban (ELIQUIS) 5 MG TABS tablet Take 1 tablet (5 mg total) by mouth 2 (two) times daily. 180 tablet 3   diltiazem (CARDIZEM LA) 240 MG 24 hr tablet Take 1 tablet (240 mg total) by mouth daily. 180 tablet 3   pantoprazole (PROTONIX) 40 MG tablet Take 1 tablet (40 mg total) by mouth 2 (two) times daily. 60 tablet 0   prochlorperazine (COMPAZINE) 10 MG tablet Take 1 tablet (10 mg total) by mouth every 6 (six) hours as needed for nausea or vomiting. 30 tablet 0   raloxifene (EVISTA) 60 MG tablet Take 60 mg by mouth daily.     sertraline (ZOLOFT) 50 MG tablet Take 50 mg by mouth daily.     simvastatin (ZOCOR) 10 MG tablet Take 10 mg by mouth at bedtime.     traZODone (DESYREL) 50 MG tablet Take 25 mg by mouth See admin instructions. Take one to two 25 mg half-tablets by mouth as needed at bedtime     No current facility-administered medications for this visit.    SURGICAL HISTORY:  Past Surgical History:  Procedure Laterality Date   APPENDECTOMY     BALLOON DILATION N/A 07/18/2022   Procedure: BALLOON DILATION;  Surgeon: Kerin Salen, MD;  Location: [facility];  Service: Gastroenterology;  Laterality: N/A;   BIOPSY  07/18/2022   Procedure: BIOPSY;  Surgeon: Kerin Salen, MD;  Location: [facility];  Service: Gastroenterology;;   ESOPHAGOGASTRODUODENOSCOPY N/A 07/18/2022   Procedure: ESOPHAGOGASTRODUODENOSCOPY (EGD);  Surgeon: Kerin Salen, MD;  Location: [facility];  Service: Gastroenterology;  Laterality: N/A;   EYE SURGERY     INTERCOSTAL NERVE BLOCK Right 03/17/2020   Procedure: Intercostal Nerve Block;  Surgeon: Loreli Slot, MD;   Location: [facility] OR;  Service: Thoracic;  Laterality: Right;   LUNG SURGERY Right 03/17/2020   NODE DISSECTION Right 03/17/2020   Procedure: Node Dissection;  Surgeon: Loreli Slot, MD;  Location: [facility] OR;  Service: Thoracic;  Laterality: Right;   TUBAL LIGATION     VIDEO BRONCHOSCOPY WITH ENDOBRONCHIAL ULTRASOUND N/A 05/22/2022   Procedure: VIDEO BRONCHOSCOPY WITH ENDOBRONCHIAL ULTRASOUND;  Surgeon: Loreli Slot, MD;  Location: [facility] OR;  Service: Thoracic;  Laterality: N/A;    REVIEW OF SYSTEMS:  A comprehensive review of systems was negative.   PHYSICAL EXAMINATION: General appearance: alert, cooperative, and no distress Head: Normocephalic, without obvious abnormality, atraumatic Neck: no adenopathy, no JVD, supple, symmetrical, trachea midline, and thyroid not enlarged, symmetric, no tenderness/mass/nodules Lymph nodes: Cervical, supraclavicular, and axillary nodes normal. Resp: clear to auscultation bilaterally Back: symmetric, no curvature. ROM normal. No CVA tenderness. Cardio: regular rate and rhythm, S1, S2 normal, no murmur, click, rub or gallop GI: soft, non-tender; bowel sounds normal; no masses,  no organomegaly Extremities: extremities normal, atraumatic, no cyanosis or edema  ECOG PERFORMANCE STATUS: 1 - Symptomatic but completely ambulatory  Blood pressure 129/73, pulse 78, temperature 98 F (36.7 C), temperature source Oral, resp. rate 17, height 5\' 3"  (1.6 m), weight 159 lb 3.2 oz (72.2 kg), SpO2 98%.   LABORATORY DATA: Lab Results  Component Value Date   WBC 7.6 05/29/2023   HGB 13.6 05/29/2023   HCT 40.9 05/29/2023   MCV 94.7 05/29/2023   PLT 221 05/29/2023      Chemistry      Component Value Date/Time   NA 140 05/29/2023 0836   K 4.2 05/29/2023 0836   CL 105 05/29/2023 0836   CO2 28 05/29/2023 0836   BUN 24 (H) 05/29/2023 0836   CREATININE 1.52 (H) 05/29/2023 0836      Component Value Date/Time   CALCIUM 9.3 05/29/2023 0836   ALKPHOS 90  05/29/2023 0836   AST 18 05/29/2023 0836   ALT 16 05/29/2023 0836   BILITOT 0.6 05/29/2023 0836       RADIOGRAPHIC STUDIES: No results found.  ASSESSMENT AND PLAN: This is a very pleasant 77 years old white female with suspicious recurrent non-small cell lung cancer initially diagnosed as history of stage IA (T1 a, N0, M0) non-small cell lung cancer, squamous cell carcinoma presented with right lower lobe lung nodule diagnosed in June 2021 status post right lower lobe superior segmentectomy with lymph node dissection under the care of Dr. Dorris Fetch on March 17, 2020.  The patient has evidence for disease recurrence in July 2023. The patient is feeling fine today with no concerning complaints except for the back pain. She had repeat CT scan of the chest performed recently.  I personally and independently reviewed the scan images and discussed the results with the patient today. Her scan showed mild  increase in the right infrahilar soft tissue encasing the bronchus intermedius and disease recurrence could not be completely excluded.  There was also new mild bilateral lower paratracheal lymphadenopathy that is nonspecific and nodal metastasis could not be completely excluded. The patient had a PET scan performed recently and that showed intensely hypermetabolic recurrence in the right infrahilar region with mildly prominent left paratracheal and paraesophageal lymph nodes also mildly hypermetabolic for size and suspicious for nodal metastasis but no distant metastatic disease. The patient underwent repeat bronchoscopy with EBUS under the care of Dr. Dorris Fetch and the final pathology was consistent with recurrent squamous cell carcinoma of the lung.  MRI of the brain was negative for malignancy. She underwent treatment with concurrent chemoradiation with weekly carboplatin for AUC of 2 and paclitaxel 45 Mg/M2.  First dose today June 05, 2022.  Status post 6 cycles.  She has partial response. The  patient is currently undergoing consolidation treatment with immunotherapy with Imfinzi 1500 Mg IV every 4 weeks status post 11 cycles.   The patient has been tolerating her treatment with immunotherapy fairly well with no concerning adverse effects. I recommended for her to proceed with cycle #12 today as planned. She will come back for follow-up visit in 4 weeks for evaluation before the next cycle of her treatment. The patient was advised to call immediately if she has any other concerning symptoms in the interval. The patient voices understanding of current disease status and treatment options and is in agreement with the current care plan.  All questions were answered. The patient knows to call the clinic with any problems, questions or concerns. We can certainly see the patient much sooner if necessary. The total time spent in the appointment was 20 minutes.   Disclaimer: This note was dictated with voice recognition software. Similar sounding words can inadvertently be transcribed and may not be corrected upon review.

## 2023-07-12 ENCOUNTER — Other Ambulatory Visit: Payer: Self-pay

## 2023-07-24 ENCOUNTER — Inpatient Hospital Stay: Payer: Medicare HMO | Attending: Internal Medicine

## 2023-07-24 ENCOUNTER — Inpatient Hospital Stay: Payer: Medicare HMO

## 2023-07-24 ENCOUNTER — Inpatient Hospital Stay (HOSPITAL_BASED_OUTPATIENT_CLINIC_OR_DEPARTMENT_OTHER): Payer: Medicare HMO | Admitting: Internal Medicine

## 2023-07-24 VITALS — BP 164/70 | HR 90 | Temp 98.0°F | Resp 17 | Ht 63.0 in | Wt 158.4 lb

## 2023-07-24 DIAGNOSIS — C3490 Malignant neoplasm of unspecified part of unspecified bronchus or lung: Secondary | ICD-10-CM

## 2023-07-24 DIAGNOSIS — Z7901 Long term (current) use of anticoagulants: Secondary | ICD-10-CM | POA: Diagnosis not present

## 2023-07-24 DIAGNOSIS — C3431 Malignant neoplasm of lower lobe, right bronchus or lung: Secondary | ICD-10-CM

## 2023-07-24 DIAGNOSIS — Z79899 Other long term (current) drug therapy: Secondary | ICD-10-CM | POA: Insufficient documentation

## 2023-07-24 DIAGNOSIS — Z5112 Encounter for antineoplastic immunotherapy: Secondary | ICD-10-CM | POA: Diagnosis not present

## 2023-07-24 LAB — CBC WITH DIFFERENTIAL (CANCER CENTER ONLY)
Abs Immature Granulocytes: 0.02 10*3/uL (ref 0.00–0.07)
Basophils Absolute: 0 10*3/uL (ref 0.0–0.1)
Basophils Relative: 1 %
Eosinophils Absolute: 0.3 10*3/uL (ref 0.0–0.5)
Eosinophils Relative: 4 %
HCT: 40.3 % (ref 36.0–46.0)
Hemoglobin: 13.5 g/dL (ref 12.0–15.0)
Immature Granulocytes: 0 %
Lymphocytes Relative: 15 %
Lymphs Abs: 1 10*3/uL (ref 0.7–4.0)
MCH: 31.5 pg (ref 26.0–34.0)
MCHC: 33.5 g/dL (ref 30.0–36.0)
MCV: 93.9 fL (ref 80.0–100.0)
Monocytes Absolute: 0.5 10*3/uL (ref 0.1–1.0)
Monocytes Relative: 7 %
Neutro Abs: 5.3 10*3/uL (ref 1.7–7.7)
Neutrophils Relative %: 73 %
Platelet Count: 212 10*3/uL (ref 150–400)
RBC: 4.29 MIL/uL (ref 3.87–5.11)
RDW: 13.2 % (ref 11.5–15.5)
WBC Count: 7.2 10*3/uL (ref 4.0–10.5)
nRBC: 0 % (ref 0.0–0.2)

## 2023-07-24 LAB — TSH: TSH: 2.013 u[IU]/mL (ref 0.350–4.500)

## 2023-07-24 LAB — CMP (CANCER CENTER ONLY)
ALT: 14 U/L (ref 0–44)
AST: 18 U/L (ref 15–41)
Albumin: 4.1 g/dL (ref 3.5–5.0)
Alkaline Phosphatase: 84 U/L (ref 38–126)
Anion gap: 7 (ref 5–15)
BUN: 19 mg/dL (ref 8–23)
CO2: 27 mmol/L (ref 22–32)
Calcium: 9.5 mg/dL (ref 8.9–10.3)
Chloride: 106 mmol/L (ref 98–111)
Creatinine: 1.3 mg/dL — ABNORMAL HIGH (ref 0.44–1.00)
GFR, Estimated: 42 mL/min — ABNORMAL LOW (ref >60)
Glucose, Bld: 84 mg/dL (ref 70–99)
Potassium: 3.9 mmol/L (ref 3.5–5.1)
Sodium: 140 mmol/L (ref 135–145)
Total Bilirubin: 0.8 mg/dL (ref 0.3–1.2)
Total Protein: 6.9 g/dL (ref 6.5–8.1)

## 2023-07-24 MED ORDER — SODIUM CHLORIDE 0.9 % IV SOLN
1500.0000 mg | Freq: Once | INTRAVENOUS | Status: AC
  Administered 2023-07-24: 1500 mg via INTRAVENOUS
  Filled 2023-07-24: qty 30

## 2023-07-24 MED ORDER — SODIUM CHLORIDE 0.9 % IV SOLN: Freq: Once | INTRAVENOUS | Status: AC

## 2023-07-24 NOTE — Progress Notes (Signed)
[facility] [phone]   [phone]  OFFICE PROGRESS NOTE  Amy Retort, MD [address] [name] [address]  DIAGNOSIS: Recurrent lung cancer initially diagnosed as stage IA (T1, N0, M0) non-small cell lung cancer, squamous cell carcinoma.  She presented with Amy right lower lobe lung nodule.  She was diagnosed in June 2021.    PRIOR THERAPY: Right lower lobe superior segmentectomy and lymph node dissection under the care of Dr. Dorris Fetch on 03/17/2020. 2) Concurrent chemoradiation with weekly carboplatin for AUC of 2 and paclitaxel 45 Mg/M2. Last dose on 07/10/22  Status post 6 cycles.   CURRENT THERAPY: Consolidation immunotherapy with Imfinzi 1500 mg IV every 4 weeks.  First dose expected on 08/22/2022.  Status post 12 cycles.  INTERVAL HISTORY: Amy [name] 77 y.o. [name] is to the clinic today for follow-up visit accompanied by her husband. Discussed the use of AI scribe software for clinical note transcription with the patient, who gave verbal consent to proceed.  History of Present Illness   Amy [name], Amy 77 year old white [name], was initially diagnosed with stage 1A non-small cell lung cancer in June 2021. Post-diagnosis, she underwent Amy segmentectomy of the right lower lobe performed by Amy thoracic surgeon. However, in October 2023, she experienced Amy disease recurrence in the lymph nodes, which led to the initiation of chemo and radiation therapy. Despite the side effects of radiation, she transitioned to consolidation treatment with immune therapy. She has since received twelve cycles of Imfinzi every four weeks.  The patient reports that her condition has remained relatively stable over the past four weeks. Her primary complaint is Amy persistent shortness of breath, which she describes as Amy lack of breath that causes her to tire easily. She denies experiencing any pain, nausea, vomiting, or diarrhea. She does,  however, report occasional coughing, which she believes is Amy side effect of the previous radiation therapy and is sometimes triggered by eating.  The patient's blood pressure was noted to be high during her last visit, but she did not provide any specific details about this issue.      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  Status post right lower lobe superior segmentectomy    ALLERGIES:  has No Known Allergies.  MEDICATIONS:  Current Outpatient Medications  Medication Sig Dispense Refill   acetaminophen (TYLENOL) 500 MG tablet Take 500-1,000 mg by mouth every 6 (six) hours as needed for mild pain or headache.     ADVAIR DISKUS 250-50 MCG/ACT AEPB Inhale 1 puff into the lungs 2 (two) times daily.     albuterol (VENTOLIN HFA) 108 (90 Base) MCG/ACT inhaler Inhale 2 puffs into the lungs every 6 (six) hours as needed for wheezing or shortness of breath.     apixaban (ELIQUIS) 5 MG TABS tablet Take 1 tablet (5 mg total) by mouth 2 (two) times daily. 180 tablet 3   diltiazem (CARDIZEM LA) 240 MG 24 hr tablet Take 1 tablet (240 mg total) by mouth daily. 180 tablet 3   pantoprazole (  PROTONIX) 40 MG tablet Take 1 tablet (40 mg total) by mouth 2 (two) times daily. 60 tablet 0   prochlorperazine (COMPAZINE) 10 MG tablet Take 1 tablet (10 mg total) by mouth every 6 (six) hours as needed for nausea or vomiting. 30 tablet 0   raloxifene (EVISTA) 60 MG tablet Take 60 mg by mouth daily.     sertraline (ZOLOFT) 50 MG tablet Take 50 mg by mouth daily.     simvastatin (ZOCOR) 10 MG tablet Take 10 mg by mouth at bedtime.     traZODone (DESYREL) 50 MG tablet Take 25 mg by  mouth See admin instructions. Take one to two 25 mg half-tablets by mouth as needed at bedtime     No current facility-administered medications for this visit.    SURGICAL HISTORY:  Past Surgical History:  Procedure Laterality Date   APPENDECTOMY     BALLOON DILATION N/Amy 07/18/2022   Procedure: BALLOON DILATION;  Surgeon: Kerin Salen, MD;  Location: [facility];  Service: Gastroenterology;  Laterality: N/Amy;   BIOPSY  07/18/2022   Procedure: BIOPSY;  Surgeon: Kerin Salen, MD;  Location: [facility];  Service: Gastroenterology;;   ESOPHAGOGASTRODUODENOSCOPY N/Amy 07/18/2022   Procedure: ESOPHAGOGASTRODUODENOSCOPY (EGD);  Surgeon: Kerin Salen, MD;  Location: [facility];  Service: Gastroenterology;  Laterality: N/Amy;   EYE SURGERY     INTERCOSTAL NERVE BLOCK Right 03/17/2020   Procedure: Intercostal Nerve Block;  Surgeon: Loreli Slot, MD;  Location: [facility];  Service: Thoracic;  Laterality: Right;   LUNG SURGERY Right 03/17/2020   NODE DISSECTION Right 03/17/2020   Procedure: Node Dissection;  Surgeon: Loreli Slot, MD;  Location: [facility];  Service: Thoracic;  Laterality: Right;   TUBAL LIGATION     VIDEO BRONCHOSCOPY WITH ENDOBRONCHIAL ULTRASOUND N/Amy 05/22/2022   Procedure: VIDEO BRONCHOSCOPY WITH ENDOBRONCHIAL ULTRASOUND;  Surgeon: Loreli Slot, MD;  Location: [facility];  Service: Thoracic;  Laterality: N/Amy;    REVIEW OF SYSTEMS:  Amy comprehensive review of systems was negative except for: Respiratory: positive for dyspnea on exertion   PHYSICAL EXAMINATION: General appearance: alert, cooperative, and no distress Head: Normocephalic, without obvious abnormality, atraumatic Neck: no adenopathy, no JVD, supple, symmetrical, trachea midline, and thyroid not enlarged, symmetric, no tenderness/mass/nodules Lymph nodes: Cervical, supraclavicular, and axillary nodes normal. Resp: clear to auscultation bilaterally Back: symmetric, no curvature. ROM normal. No CVA  tenderness. Cardio: regular rate and rhythm, S1, S2 normal, no murmur, click, rub or gallop GI: soft, non-tender; bowel sounds normal; no masses,  no organomegaly Extremities: extremities normal, atraumatic, no cyanosis or edema  ECOG PERFORMANCE STATUS: 1 - Symptomatic but completely ambulatory  Blood pressure (!) 164/70, pulse 90, temperature 98 F (36.7 C), resp. rate 17, height 5\' 3"  (1.6 m), weight 158 lb 6.4 oz (71.8 kg), SpO2 100%.   LABORATORY DATA: Lab Results  Component Value Date   WBC 7.2 07/24/2023   HGB 13.5 07/24/2023   HCT 40.3 07/24/2023   MCV 93.9 07/24/2023   PLT 212 07/24/2023      Chemistry      Component Value Date/Time   NA 140 07/24/2023 1015   K 3.9 07/24/2023 1015   CL 106 07/24/2023 1015   CO2 27 07/24/2023 1015   BUN 19 07/24/2023 1015   CREATININE 1.30 (H) 07/24/2023 1015      Component Value Date/Time   CALCIUM 9.5 07/24/2023 1015   ALKPHOS 84 07/24/2023 1015   AST 18 07/24/2023 1015   ALT 14 07/24/2023 1015  BILITOT 0.8 07/24/2023 1015       RADIOGRAPHIC STUDIES: No results found.  ASSESSMENT AND PLAN: This is Amy very pleasant 77 years old white [name] with suspicious recurrent non-small cell lung cancer initially diagnosed as history of stage IA (T1 Amy, N0, M0) non-small cell lung cancer, squamous cell carcinoma presented with right lower lobe lung nodule diagnosed in June 2021 status post right lower lobe superior segmentectomy with lymph node dissection under the care of Dr. Dorris Fetch on March 17, 2020.  The patient has evidence for disease recurrence in July 2023. The patient is feeling fine today with no concerning complaints except for the back pain. She had repeat CT scan of the chest performed recently.  I personally and independently reviewed the scan images and discussed the results with the patient today. Her scan showed mild increase in the right infrahilar soft tissue encasing the bronchus intermedius and disease recurrence  could not be completely excluded.  There was also new mild bilateral lower paratracheal lymphadenopathy that is nonspecific and nodal metastasis could not be completely excluded. The patient had Amy PET scan performed recently and that showed intensely hypermetabolic recurrence in the right infrahilar region with mildly prominent left paratracheal and paraesophageal lymph nodes also mildly hypermetabolic for size and suspicious for nodal metastasis but no distant metastatic disease. The patient underwent repeat bronchoscopy with EBUS under the care of Dr. Dorris Fetch and the final pathology was consistent with recurrent squamous cell carcinoma of the lung.  MRI of the brain was negative for malignancy. She underwent treatment with concurrent chemoradiation with weekly carboplatin for AUC of 2 and paclitaxel 45 Mg/M2.  First dose today June 05, 2022.  Status post 6 cycles.  She has partial response. The patient is currently undergoing consolidation treatment with immunotherapy with Imfinzi 1500 Mg IV every 4 weeks status post 12 cycles.  She has been tolerating this treatment fairly well.    Non-small cell lung cancer Recurrence in lymph nodes in October 2023 after initial diagnosis in June 2021 and segmentectomy of the right lower lobe. Tolerated chemo and radiation well, with expected side effects from radiation. Currently on consolidation treatment with Imfinzi, with today marking the 13th and final cycle. Persistent shortness of breath, likely secondary to prior radiation. No other significant side effects reported. -Proceed with 13th cycle of Imfinzi today. -Plan for chest CT scan in 1 month to assess response to treatment. -If scan is favorable, follow-up every 3 months or longer.  Hypertension Blood pressure elevated today. -Monitor blood pressure and manage as needed.   The patient was advised to call immediately if she has any other concerning symptoms in the interval. The patient voices  understanding of current disease status and treatment options and is in agreement with the current care plan.  All questions were answered. The patient knows to call the clinic with any problems, questions or concerns. We can certainly see the patient much sooner if necessary. The total time spent in the appointment was 20 minutes.   Disclaimer: This note was dictated with voice recognition software. Similar sounding words can inadvertently be transcribed and may not be corrected upon review.

## 2023-07-24 NOTE — Patient Instructions (Signed)
[place] [facility]  Discharge Instructions: Thank you for choosing [facility] to provide your oncology and hematology care.   If you have a lab appointment with the Cancer Center, please go directly to the Cancer Center and check in at the registration area.   Wear comfortable clothing and clothing appropriate for easy access to any Portacath or PICC line.   We strive to give you quality time with your provider. You may need to reschedule your appointment if you arrive late (15 or more minutes).  Arriving late affects you and other patients whose appointments are after yours.  Also, if you miss three or more appointments without notifying the office, you may be dismissed from the clinic at the provider's discretion.      For prescription refill requests, have your pharmacy contact our office and allow 72 hours for refills to be completed.    Today you received the following chemotherapy and/or immunotherapy agent: Durvalumab (Imfinzi)   To help prevent nausea and vomiting after your treatment, we encourage you to take your nausea medication as directed.  BELOW ARE SYMPTOMS THAT SHOULD BE REPORTED IMMEDIATELY: *FEVER GREATER THAN 100.4 F (38 C) OR HIGHER *CHILLS OR SWEATING *NAUSEA AND VOMITING THAT IS NOT CONTROLLED WITH YOUR NAUSEA MEDICATION *UNUSUAL SHORTNESS OF BREATH *UNUSUAL BRUISING OR BLEEDING *URINARY PROBLEMS (pain or burning when urinating, or frequent urination) *BOWEL PROBLEMS (unusual diarrhea, constipation, pain near the anus) TENDERNESS IN MOUTH AND THROAT WITH OR WITHOUT PRESENCE OF ULCERS (sore throat, sores in mouth, or a toothache) UNUSUAL RASH, SWELLING OR PAIN  UNUSUAL VAGINAL DISCHARGE OR ITCHING   Items with * indicate a potential emergency and should be followed up as soon as possible or go to the Emergency Department if any problems should occur.  Please show the CHEMOTHERAPY ALERT CARD or IMMUNOTHERAPY ALERT CARD  at check-in to the Emergency Department and triage nurse.  Should you have questions after your visit or need to cancel or reschedule your appointment, please contact [place] [facility]  Dept: [phone]  and follow the prompts.  Office hours are 8:00 a.m. to 4:30 p.m. Monday - Friday. Please note that voicemails left after 4:00 p.m. may not be returned until the following business day.  We are closed weekends and major holidays. You have access to a nurse at all times for urgent questions. Please call the main number to the clinic Dept: [phone] and follow the prompts.   For any non-urgent questions, you may also contact your provider using MyChart. We now offer e-Visits for anyone 10 and older to request care online for non-urgent symptoms. For details visit [email or web address].   Also download the MyChart app! Go to the app store, search "MyChart", open the app, select [place], and log in with your MyChart username and password.

## 2023-08-07 ENCOUNTER — Other Ambulatory Visit: Payer: Self-pay | Admitting: Family Medicine

## 2023-08-07 DIAGNOSIS — Z0000 Encounter for general adult medical examination without abnormal findings: Secondary | ICD-10-CM

## 2023-08-13 ENCOUNTER — Inpatient Hospital Stay: Payer: Medicare HMO

## 2023-08-13 ENCOUNTER — Ambulatory Visit (HOSPITAL_COMMUNITY)
Admission: RE | Admit: 2023-08-13 | Discharge: 2023-08-13 | Disposition: A | Discharge status: Home / Self Care | Payer: Medicare HMO | Source: Ambulatory Visit | Attending: Internal Medicine | Admitting: Internal Medicine

## 2023-08-13 DIAGNOSIS — Z7901 Long term (current) use of anticoagulants: Secondary | ICD-10-CM | POA: Diagnosis not present

## 2023-08-13 DIAGNOSIS — C3431 Malignant neoplasm of lower lobe, right bronchus or lung: Secondary | ICD-10-CM

## 2023-08-13 DIAGNOSIS — Z5112 Encounter for antineoplastic immunotherapy: Secondary | ICD-10-CM | POA: Diagnosis not present

## 2023-08-13 DIAGNOSIS — C3490 Malignant neoplasm of unspecified part of unspecified bronchus or lung: Secondary | ICD-10-CM | POA: Insufficient documentation

## 2023-08-13 DIAGNOSIS — J432 Centrilobular emphysema: Secondary | ICD-10-CM | POA: Diagnosis not present

## 2023-08-13 DIAGNOSIS — I700 Atherosclerosis of aorta: Secondary | ICD-10-CM | POA: Diagnosis not present

## 2023-08-13 DIAGNOSIS — Z79899 Other long term (current) drug therapy: Secondary | ICD-10-CM | POA: Diagnosis not present

## 2023-08-13 LAB — CBC WITH DIFFERENTIAL (CANCER CENTER ONLY)
Abs Immature Granulocytes: 0.01 10*3/uL (ref 0.00–0.07)
Basophils Absolute: 0.1 10*3/uL (ref 0.0–0.1)
Basophils Relative: 1 %
Eosinophils Absolute: 0.3 10*3/uL (ref 0.0–0.5)
Eosinophils Relative: 3 %
HCT: 40.5 % (ref 36.0–46.0)
Hemoglobin: 13.9 g/dL (ref 12.0–15.0)
Immature Granulocytes: 0 %
Lymphocytes Relative: 13 %
Lymphs Abs: 1.2 10*3/uL (ref 0.7–4.0)
MCH: 32.3 pg (ref 26.0–34.0)
MCHC: 34.3 g/dL (ref 30.0–36.0)
MCV: 94 fL (ref 80.0–100.0)
Monocytes Absolute: 0.7 10*3/uL (ref 0.1–1.0)
Monocytes Relative: 7 %
Neutro Abs: 7.1 10*3/uL (ref 1.7–7.7)
Neutrophils Relative %: 76 %
Platelet Count: 256 10*3/uL (ref 150–400)
RBC: 4.31 MIL/uL (ref 3.87–5.11)
RDW: 12.8 % (ref 11.5–15.5)
WBC Count: 9.2 10*3/uL (ref 4.0–10.5)
nRBC: 0 % (ref 0.0–0.2)

## 2023-08-13 LAB — CMP (CANCER CENTER ONLY)
ALT: 11 U/L (ref 0–44)
AST: 14 U/L — ABNORMAL LOW (ref 15–41)
Albumin: 4 g/dL (ref 3.5–5.0)
Alkaline Phosphatase: 91 U/L (ref 38–126)
Anion gap: 5 (ref 5–15)
BUN: 19 mg/dL (ref 8–23)
CO2: 29 mmol/L (ref 22–32)
Calcium: 9.6 mg/dL (ref 8.9–10.3)
Chloride: 104 mmol/L (ref 98–111)
Creatinine: 1.5 mg/dL — ABNORMAL HIGH (ref 0.44–1.00)
GFR, Estimated: 35 mL/min — ABNORMAL LOW (ref >60)
Glucose, Bld: 104 mg/dL — ABNORMAL HIGH (ref 70–99)
Potassium: 4.5 mmol/L (ref 3.5–5.1)
Sodium: 138 mmol/L (ref 135–145)
Total Bilirubin: 0.7 mg/dL (ref 0.3–1.2)
Total Protein: 6.9 g/dL (ref 6.5–8.1)

## 2023-08-13 MED ORDER — IOHEXOL 300 MG/ML  SOLN
65.0000 mL | Freq: Once | INTRAMUSCULAR | Status: AC | PRN
  Administered 2023-08-13: 65 mL via INTRAVENOUS

## 2023-08-17 ENCOUNTER — Other Ambulatory Visit: Payer: Self-pay | Admitting: Cardiology

## 2023-08-17 DIAGNOSIS — I480 Paroxysmal atrial fibrillation: Secondary | ICD-10-CM

## 2023-08-17 NOTE — Telephone Encounter (Signed)
Prescription refill request for Eliquis received. Indication: Afib  Last office visit: 08/21/22 ([place])  Scr: 1.50 (08/13/23)  Age: 77 Weight: 71.8kg  Appropriate dose. Refill sent.

## 2023-08-20 ENCOUNTER — Inpatient Hospital Stay: Payer: Medicare HMO | Attending: Internal Medicine | Admitting: Internal Medicine

## 2023-08-20 VITALS — BP 128/73 | HR 80 | Temp 97.7°F | Resp 16

## 2023-08-20 DIAGNOSIS — Z923 Personal history of irradiation: Secondary | ICD-10-CM | POA: Insufficient documentation

## 2023-08-20 DIAGNOSIS — Z9221 Personal history of antineoplastic chemotherapy: Secondary | ICD-10-CM | POA: Diagnosis not present

## 2023-08-20 DIAGNOSIS — Z85118 Personal history of other malignant neoplasm of bronchus and lung: Secondary | ICD-10-CM | POA: Diagnosis not present

## 2023-08-20 DIAGNOSIS — C3490 Malignant neoplasm of unspecified part of unspecified bronchus or lung: Secondary | ICD-10-CM | POA: Diagnosis not present

## 2023-08-20 NOTE — Progress Notes (Signed)
[facility] [phone]   [phone]  OFFICE PROGRESS NOTE  Noberto Retort, MD [address] [place] [address]  DIAGNOSIS: Recurrent lung cancer initially diagnosed as stage IA (T1, N0, M0) non-small cell lung cancer, squamous cell carcinoma.  She presented with a right lower lobe lung nodule.  She was diagnosed in June 2021.    PRIOR THERAPY: Right lower lobe superior segmentectomy and lymph node dissection under the care of Dr. Dorris Fetch on 03/17/2020. 2) Concurrent chemoradiation with weekly carboplatin for AUC of 2 and paclitaxel 45 Mg/M2. Last dose on 07/10/22  Status post 6 cycles. 3) Consolidation immunotherapy with Imfinzi 1500 mg IV every 4 weeks.  First dose expected on 08/22/2022.  Status post 13 cycles.   CURRENT THERAPY:  INTERVAL HISTORY: Amy [name] 77 y.o. female is to the clinic today for follow-up visit accompanied by her husband, Annette Stable.  Discussed the use of AI scribe software for clinical note transcription with the patient, who gave verbal consent to proceed.  History of Present Illness   The patient, a 77 year old individual with a history of stage three lung cancer, presents for a follow-up visit after completing a year of consolidation Imfinzi, an immunotherapy, a month ago. The patient reports feeling fine overall, but notes a lack of energy and breathlessness, particularly upon exertion. However, she denies any need for supplemental oxygen. She denies experiencing any nausea, vomiting, diarrhea, headaches, or changes in vision. The patient also reports no weight loss since the last visit; in fact, she has gained three pounds. The patient's cancer treatment journey included chemotherapy and radiation with weekly carboplatin and paclitaxel, followed by the aforementioned immune therapy. The patient tolerated the treatments well, with the exception of hair loss, which has since started to regrow.         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  Status post right lower lobe superior segmentectomy    ALLERGIES:  has No Known Allergies.  MEDICATIONS:  Current Outpatient Medications  Medication Sig Dispense Refill   acetaminophen (TYLENOL) 500 MG tablet Take 500-1,000 mg by mouth every 6 (six) hours as needed for mild pain or headache.     ADVAIR DISKUS 250-50 MCG/ACT AEPB Inhale 1 puff into the lungs 2 (two) times daily.     albuterol (VENTOLIN HFA) 108 (90 Base) MCG/ACT inhaler Inhale 2 puffs into the lungs every 6 (six) hours as needed for wheezing or shortness of breath.     apixaban (ELIQUIS) 5 MG TABS tablet TAKE 1 TABLET BY MOUTH TWICE A DAY 180 tablet 1   diltiazem (CARDIZEM LA) 240 MG 24 hr tablet Take 1 tablet (240 mg total) by mouth daily. 180 tablet 3   pantoprazole (PROTONIX) 40 MG tablet Take 1 tablet (40 mg total) by mouth 2 (two) times daily. 60 tablet 0   prochlorperazine (COMPAZINE) 10 MG tablet Take 1 tablet (10 mg total) by mouth every 6 (six) hours as needed for nausea or vomiting. 30 tablet 0   raloxifene (EVISTA) 60 MG tablet Take 60  mg by mouth daily.     sertraline (ZOLOFT) 50 MG tablet Take 50 mg by mouth daily.     simvastatin (ZOCOR) 10 MG tablet Take 10 mg by mouth at bedtime.     traZODone (DESYREL) 50 MG tablet Take 25 mg by mouth See admin instructions. Take one to two 25 mg half-tablets by mouth as needed at bedtime     No current facility-administered medications for this visit.    SURGICAL HISTORY:  Past Surgical History:  Procedure Laterality Date   APPENDECTOMY     BALLOON DILATION N/A 07/18/2022   Procedure:  BALLOON DILATION;  Surgeon: Kerin Salen, MD;  Location: [facility];  Service: Gastroenterology;  Laterality: N/A;   BIOPSY  07/18/2022   Procedure: BIOPSY;  Surgeon: Kerin Salen, MD;  Location: [facility];  Service: Gastroenterology;;   ESOPHAGOGASTRODUODENOSCOPY N/A 07/18/2022   Procedure: ESOPHAGOGASTRODUODENOSCOPY (EGD);  Surgeon: Kerin Salen, MD;  Location: [facility];  Service: Gastroenterology;  Laterality: N/A;   EYE SURGERY     INTERCOSTAL NERVE BLOCK Right 03/17/2020   Procedure: Intercostal Nerve Block;  Surgeon: Loreli Slot, MD;  Location: [facility] OR;  Service: Thoracic;  Laterality: Right;   LUNG SURGERY Right 03/17/2020   NODE DISSECTION Right 03/17/2020   Procedure: Node Dissection;  Surgeon: Loreli Slot, MD;  Location: [facility] OR;  Service: Thoracic;  Laterality: Right;   TUBAL LIGATION     VIDEO BRONCHOSCOPY WITH ENDOBRONCHIAL ULTRASOUND N/A 05/22/2022   Procedure: VIDEO BRONCHOSCOPY WITH ENDOBRONCHIAL ULTRASOUND;  Surgeon: Loreli Slot, MD;  Location: [facility] OR;  Service: Thoracic;  Laterality: N/A;    REVIEW OF SYSTEMS:  Constitutional: positive for fatigue Eyes: negative Ears, nose, mouth, throat, and face: negative Respiratory: positive for dyspnea on exertion Cardiovascular: negative Gastrointestinal: negative Genitourinary:negative Integument/breast: negative Hematologic/lymphatic: negative Musculoskeletal:negative Neurological: negative Behavioral/Psych: negative Endocrine: negative Allergic/Immunologic: negative   PHYSICAL EXAMINATION: General appearance: alert, cooperative, and no distress Head: Normocephalic, without obvious abnormality, atraumatic Neck: no adenopathy, no JVD, supple, symmetrical, trachea midline, and thyroid not enlarged, symmetric, no tenderness/mass/nodules Lymph nodes: Cervical, supraclavicular, and axillary nodes normal. Resp: clear to auscultation bilaterally Back: symmetric, no curvature. ROM normal. No CVA  tenderness. Cardio: regular rate and rhythm, S1, S2 normal, no murmur, click, rub or gallop GI: soft, non-tender; bowel sounds normal; no masses,  no organomegaly Extremities: extremities normal, atraumatic, no cyanosis or edema Neurologic: Alert and oriented X 3, normal strength and tone. Normal symmetric reflexes. Normal coordination and gait  ECOG PERFORMANCE STATUS: 1 - Symptomatic but completely ambulatory  Blood pressure 128/73, pulse 80, temperature 97.7 F (36.5 C), temperature source Oral, resp. rate 16, SpO2 99%.   LABORATORY DATA: Lab Results  Component Value Date   WBC 9.2 08/13/2023   HGB 13.9 08/13/2023   HCT 40.5 08/13/2023   MCV 94.0 08/13/2023   PLT 256 08/13/2023      Chemistry      Component Value Date/Time   NA 138 08/13/2023 1353   K 4.5 08/13/2023 1353   CL 104 08/13/2023 1353   CO2 29 08/13/2023 1353   BUN 19 08/13/2023 1353   CREATININE 1.50 (H) 08/13/2023 1353      Component Value Date/Time   CALCIUM 9.6 08/13/2023 1353   ALKPHOS 91 08/13/2023 1353   AST 14 (L) 08/13/2023 1353   ALT 11 08/13/2023 1353   BILITOT 0.7 08/13/2023 1353       RADIOGRAPHIC STUDIES: CT Chest W Contrast  Result Date: 08/16/2023 CLINICAL DATA:  Metastatic non-small cell  lung cancer. Immunotherapy completed. Restaging. * Tracking Code: BO * EXAM: CT CHEST WITH CONTRAST TECHNIQUE: Multidetector CT imaging of the chest was performed during intravenous contrast administration. RADIATION DOSE REDUCTION: This exam was performed according to the departmental dose-optimization program which includes automated exposure control, adjustment of the mA and/or kV according to patient size and/or use of iterative reconstruction technique. CONTRAST:  65mL OMNIPAQUE IOHEXOL 300 MG/ML  SOLN COMPARISON:  Multiple prior studies, most recently performed 2023/05/26 and 04/02/2023. PET-CT 04/27/2022. FINDINGS: Cardiovascular: No acute vascular findings are identified. There is diffuse  atherosclerosis of the aorta, great vessels and coronary arteries. Possible chronic ostial stenosis at the origin of the left subclavian artery. There are aortic valvular calcifications. The heart size is normal. There is no pericardial effusion. Mediastinum/Nodes: There are no enlarged mediastinal, hilar or axillary lymph nodes.Unchanged distal esophageal wall thickening and soft tissue thickening posterior to the right hilum. The thyroid gland and trachea appear unremarkable. Lungs/Pleura: No pleural effusion or pneumothorax. Stable pleural thickening posteriorly on the right. Moderate centrilobular and paraseptal emphysema with diffuse central airway thickening. There are stable paramediastinal postsurgical and radiation changes on the right with chronic volume loss, perihilar scarring and traction bronchiectasis. There is stable scarring at the left lung apex. A solid 6 mm left upper lobe nodule on image 36/5 is stable from multiple previous studies. A few other scattered small nodules in both lungs are also stable, and no new, enlarging or otherwise suspicious pulmonary nodules are identified. Upper abdomen: The visualized upper abdomen appears stable, without suspicious findings. Musculoskeletal/Chest wall: There is no chest wall mass or suspicious osseous finding. Stable multilevel spondylosis. Unless specific follow-up recommendations are mentioned in the findings or impression sections, no imaging follow-up of any mentioned incidental findings is recommended. IMPRESSION: 1. Stable postsurgical and radiation changes on the right. No evidence of local recurrence or metastatic disease. 2. Stable small bilateral pulmonary nodules, likely benign based on long-term stability. 3. Aortic Atherosclerosis (ICD10-I70.0) and Emphysema (ICD10-J43.9). Electronically Signed   By: Carey Bullocks M.D.   On: 08/16/2023 17:01    ASSESSMENT AND PLAN: This is a very pleasant 77 years old white female with suspicious  recurrent non-small cell lung cancer initially diagnosed as history of stage IA (T1 a, N0, M0) non-small cell lung cancer, squamous cell carcinoma presented with right lower lobe lung nodule diagnosed in June 2021 status post right lower lobe superior segmentectomy with lymph node dissection under the care of Dr. Dorris Fetch on March 17, 2020.  The patient has evidence for disease recurrence in July 2023. The patient is feeling fine today with no concerning complaints except for the back pain. She had repeat CT scan of the chest performed recently.  I personally and independently reviewed the scan images and discussed the results with the patient today. Her scan showed mild increase in the right infrahilar soft tissue encasing the bronchus intermedius and disease recurrence could not be completely excluded.  There was also new mild bilateral lower paratracheal lymphadenopathy that is nonspecific and nodal metastasis could not be completely excluded. The patient had a PET scan performed recently and that showed intensely hypermetabolic recurrence in the right infrahilar region with mildly prominent left paratracheal and paraesophageal lymph nodes also mildly hypermetabolic for size and suspicious for nodal metastasis but no distant metastatic disease. The patient underwent repeat bronchoscopy with EBUS under the care of Dr. Dorris Fetch and the final pathology was consistent with recurrent squamous cell carcinoma of the lung.  MRI of the brain  was negative for malignancy. She underwent treatment with concurrent chemoradiation with weekly carboplatin for AUC of 2 and paclitaxel 45 Mg/M2.  First dose today June 05, 2022.  Status post 6 cycles.  She has partial response. The patient completed consolidation treatment with immunotherapy with Imfinzi 1500 Mg IV every 4 weeks status post 13 cycles.  She has been tolerating this treatment fairly well. She had repeat CT scan of the chest performed recently.  I  personally and independently reviewed the scan and discussed the result with the patient and her husband. Her scan showed no concerning finding for local recurrence or metastatic disease.    Stage III Lung Cancer Completed treatment with chemo, radiation, and consolidation Mfenzi. Recent chest scan shows stable disease with no evidence of cancer growth or spread. Reports fatigue and shortness of breath on exertion, but no pain, nausea, vomiting, diarrhea, headaches, or vision changes. -Continue surveillance with chest scans every three months for the first year. -If scans remain stable, consider extending interval to four to six months.  General Health Maintenance Gained three pounds since last visit. -Encourage healthy diet and exercise as tolerated.   The patient was advised to call immediately if she has any concerning symptoms in the interval. The patient voices understanding of current disease status and treatment options and is in agreement with the current care plan.  All questions were answered. The patient knows to call the clinic with any problems, questions or concerns. We can certainly see the patient much sooner if necessary. The total time spent in the appointment was 30 minutes.   Disclaimer: This note was dictated with voice recognition software. Similar sounding words can inadvertently be transcribed and may not be corrected upon review.

## 2023-08-22 ENCOUNTER — Ambulatory Visit: Payer: Self-pay | Admitting: Cardiology

## 2023-08-23 ENCOUNTER — Other Ambulatory Visit: Payer: Self-pay

## 2023-08-25 ENCOUNTER — Telehealth: Payer: Self-pay | Admitting: Internal Medicine

## 2023-08-25 NOTE — Telephone Encounter (Signed)
Scheduled per 11/04 los, patient has been called and notified of upcoming appointments.

## 2023-09-10 ENCOUNTER — Ambulatory Visit
Admission: RE | Admit: 2023-09-10 | Discharge: 2023-09-10 | Disposition: A | Discharge status: Home / Self Care | Payer: Medicare HMO | Source: Ambulatory Visit | Attending: Family Medicine | Admitting: Family Medicine

## 2023-09-10 DIAGNOSIS — Z1231 Encounter for screening mammogram for malignant neoplasm of breast: Secondary | ICD-10-CM | POA: Diagnosis not present

## 2023-09-10 DIAGNOSIS — Z0000 Encounter for general adult medical examination without abnormal findings: Secondary | ICD-10-CM

## 2023-09-12 ENCOUNTER — Other Ambulatory Visit: Payer: Self-pay | Admitting: Family Medicine

## 2023-09-12 DIAGNOSIS — R928 Other abnormal and inconclusive findings on diagnostic imaging of breast: Secondary | ICD-10-CM

## 2023-09-18 ENCOUNTER — Ambulatory Visit: Payer: Medicare HMO | Attending: Cardiology | Admitting: Cardiology

## 2023-09-18 ENCOUNTER — Encounter: Payer: Self-pay | Admitting: Cardiology

## 2023-09-18 VITALS — BP 120/60 | HR 86 | Resp 16 | Ht 63.0 in | Wt 162.0 lb

## 2023-09-18 DIAGNOSIS — E782 Mixed hyperlipidemia: Secondary | ICD-10-CM | POA: Diagnosis not present

## 2023-09-18 DIAGNOSIS — I480 Paroxysmal atrial fibrillation: Secondary | ICD-10-CM | POA: Diagnosis not present

## 2023-09-18 MED ORDER — ROSUVASTATIN CALCIUM 10 MG PO TABS: 10.0000 mg | ORAL_TABLET | Freq: Every day | ORAL | 2 refills | Status: DC

## 2023-09-18 NOTE — Progress Notes (Signed)
Cardiology Office Note:  .   Date:  09/18/2023  ID:  Amy [name], DOB [birth year], MRN [id number] PCP: Noberto Retort, MD  [facility] Providers Cardiologist:  Truett Mainland, MD PCP: Noberto Retort, MD  Chief Complaint  Patient presents with   Paroxysmal atrial fibrillation    Follow-up    1 year      History of Present Illness: .    Amy [name] is a 77 y.o. female with hypertension, hyperlipidemia, PAF, aortic atherosclerosis, recurrent squamous cell carcinoma of right lower lobe lung-now s/p RLL segmentectomy and LN resection, s/p chemoradiation and immunotherapy, former smoker, COPD  Monitoring persistent dyspnea since her lung surgery, she is doing well and denies any symptoms or complaints.  Blood pressure is well-controlled.  She is compliant with Eliquis.  Reviewed lipid panel results with the patient, details below.  Vitals:   09/18/23 1138  BP: 120/60  Pulse: 86  Resp: 16  SpO2: 97%     ROS:  Review of Systems  Cardiovascular:  Positive for dyspnea on exertion. Negative for chest pain, leg swelling, palpitations and syncope.     Studies Reviewed: Marland Kitchen        EKG 09/18/2023: Sinus rhythm with Premature atrial complexes When compared with ECG of 17-Aug-2022 14:17, Nonspecific T wave abnormality now evident in Lateral leads    Independently interpreted Labs 08/13/2023: Hb 13.9. Cr 1.5 Labs 01/18/2023: Chol 216, TG 109, HDL 64, LDL 133  Risk Assessment/Calculations:    CHA2DS2-VASc Score = 3   his indicates a  3.2% annual risk of stroke. The patient's score is based upon:        Physical Exam:   Physical Exam Vitals and nursing note reviewed.  Constitutional:      General: She is not in acute distress. Neck:     Vascular: No JVD.  Cardiovascular:     Rate and Rhythm: Normal rate and regular rhythm.     Heart sounds: Normal heart sounds. No murmur heard. Pulmonary:     Effort: Pulmonary effort is normal.     Breath  sounds: Normal breath sounds. No wheezing or rales.  Musculoskeletal:     Right lower leg: No edema.     Left lower leg: No edema.      VISIT DIAGNOSES:   ICD-10-CM   1. Paroxysmal atrial fibrillation (HCC)  I48.0 EKG 12-Lead    Lipid Profile    Lipid Profile    2. Mixed hyperlipidemia  E78.2 Lipid Profile    Lipid Profile       ASSESSMENT AND PLAN: .    Amy [name] is a 77 y.o. female with hypertension, hyperlipidemia, PAF, aortic atherosclerosis, recurrent squamous cell carcinoma of right lower lobe lung-now s/p RLL segmentectomy and LN resection, s/p chemoradiation and immunotherapy, former smoker, COPD   PAF: Currently in sinus rhythm Continue diltiazem 240 mg daily. CHA2DS2VASc score 3, annual stroke risk 3.2%.  Continue Eliquis 5 mg bid.  Mixed hyperlipidemia: Chol 216, TG 109, HDL 64, LDL 133 (01/2023) Recommend switching simvastatin 10 mg daily to Crestor 10 mg daily. Repeat lipid panel in 3 months.  If LDL remains >70, increase Crestor to 20 mg daily.     Meds ordered this encounter  Medications   rosuvastatin (CRESTOR) 10 MG tablet    Sig: Take 1 tablet (10 mg total) by mouth daily.    Dispense:  90 tablet    Refill:  2     F/u in 1 year  Signed, Elder Negus, MD

## 2023-09-18 NOTE — Patient Instructions (Addendum)
Medication Instructions:   STOP TAKING SIMVASTATIN NOW  START TAKING CRESTOR 10 MG BY MOUTH DAILY  *If you need a refill on your cardiac medications before your next appointment, please call your pharmacy*   Lab Work:  IN 2 MONTHS HERE AT THE [facility] ON THE FIRST FLOOR--CHECK LIPIDS--PLEASE COME FASTING TO THIS LAB APPOINTMENT  If you have labs (blood work) drawn today and your tests are completely normal, you will receive your results only by: MyChart Message (if you have MyChart) OR A paper copy in the mail If you have any lab test that is abnormal or we need to change your treatment, we will call you to review the results.   Follow-Up: At [facility], you and your health needs are our priority.  As part of our continuing mission to provide you with exceptional heart care, we have created designated Provider Care Teams.  These Care Teams include your primary Cardiologist (physician) and Advanced Practice Providers (APPs -  Physician Assistants and Nurse Practitioners) who all work together to provide you with the care you need, when you need it.  We recommend signing up for the patient portal called "MyChart".  Sign up information is provided on this After Visit Summary.  MyChart is used to connect with patients for Virtual Visits (Telemedicine).  Patients are able to view lab/test results, encounter notes, upcoming appointments, etc.  Non-urgent messages can be sent to your provider as well.   To learn more about what you can do with MyChart, go to [email or web address].    Your next appointment:   1 year(s)  Provider:   DR. Rosemary Holms

## 2023-09-21 ENCOUNTER — Ambulatory Visit
Admission: RE | Admit: 2023-09-21 | Discharge: 2023-09-21 | Disposition: A | Discharge status: Home / Self Care | Payer: Medicare HMO | Source: Ambulatory Visit | Attending: Family Medicine | Admitting: Family Medicine

## 2023-09-21 DIAGNOSIS — R928 Other abnormal and inconclusive findings on diagnostic imaging of breast: Secondary | ICD-10-CM

## 2023-09-21 DIAGNOSIS — N6324 Unspecified lump in the left breast, lower inner quadrant: Secondary | ICD-10-CM | POA: Diagnosis not present

## 2023-10-03 ENCOUNTER — Other Ambulatory Visit: Payer: Medicare HMO

## 2023-11-12 ENCOUNTER — Ambulatory Visit (HOSPITAL_COMMUNITY)
Admission: RE | Admit: 2023-11-12 | Discharge: 2023-11-12 | Disposition: A | Discharge status: Home / Self Care | Payer: Medicare HMO | Source: Ambulatory Visit | Attending: Internal Medicine | Admitting: Internal Medicine

## 2023-11-12 ENCOUNTER — Inpatient Hospital Stay: Payer: Medicare HMO | Attending: Internal Medicine

## 2023-11-12 ENCOUNTER — Encounter (HOSPITAL_COMMUNITY): Payer: Self-pay

## 2023-11-12 DIAGNOSIS — I700 Atherosclerosis of aorta: Secondary | ICD-10-CM | POA: Insufficient documentation

## 2023-11-12 DIAGNOSIS — C3481 Malignant neoplasm of overlapping sites of right bronchus and lung: Secondary | ICD-10-CM | POA: Insufficient documentation

## 2023-11-12 DIAGNOSIS — Z79899 Other long term (current) drug therapy: Secondary | ICD-10-CM | POA: Insufficient documentation

## 2023-11-12 DIAGNOSIS — C3490 Malignant neoplasm of unspecified part of unspecified bronchus or lung: Secondary | ICD-10-CM | POA: Insufficient documentation

## 2023-11-12 DIAGNOSIS — Z923 Personal history of irradiation: Secondary | ICD-10-CM | POA: Diagnosis not present

## 2023-11-12 DIAGNOSIS — I2510 Atherosclerotic heart disease of native coronary artery without angina pectoris: Secondary | ICD-10-CM | POA: Insufficient documentation

## 2023-11-12 LAB — CBC WITH DIFFERENTIAL (CANCER CENTER ONLY)
Abs Immature Granulocytes: 0.03 10*3/uL (ref 0.00–0.07)
Basophils Absolute: 0.1 10*3/uL (ref 0.0–0.1)
Basophils Relative: 1 %
Eosinophils Absolute: 0.1 10*3/uL (ref 0.0–0.5)
Eosinophils Relative: 1 %
HCT: 41.3 % (ref 36.0–46.0)
Hemoglobin: 14.1 g/dL (ref 12.0–15.0)
Immature Granulocytes: 0 %
Lymphocytes Relative: 12 %
Lymphs Abs: 1.1 10*3/uL (ref 0.7–4.0)
MCH: 30.7 pg (ref 26.0–34.0)
MCHC: 34.1 g/dL (ref 30.0–36.0)
MCV: 90 fL (ref 80.0–100.0)
Monocytes Absolute: 0.5 10*3/uL (ref 0.1–1.0)
Monocytes Relative: 6 %
Neutro Abs: 6.8 10*3/uL (ref 1.7–7.7)
Neutrophils Relative %: 80 %
Platelet Count: 213 10*3/uL (ref 150–400)
RBC: 4.59 MIL/uL (ref 3.87–5.11)
RDW: 13.1 % (ref 11.5–15.5)
WBC Count: 8.6 10*3/uL (ref 4.0–10.5)
nRBC: 0 % (ref 0.0–0.2)

## 2023-11-12 LAB — CMP (CANCER CENTER ONLY)
ALT: 10 U/L (ref 0–44)
AST: 17 U/L (ref 15–41)
Albumin: 4.3 g/dL (ref 3.5–5.0)
Alkaline Phosphatase: 83 U/L (ref 38–126)
Anion gap: 5 (ref 5–15)
BUN: 25 mg/dL — ABNORMAL HIGH (ref 8–23)
CO2: 29 mmol/L (ref 22–32)
Calcium: 10.2 mg/dL (ref 8.9–10.3)
Chloride: 104 mmol/L (ref 98–111)
Creatinine: 1.47 mg/dL — ABNORMAL HIGH (ref 0.44–1.00)
GFR, Estimated: 36 mL/min — ABNORMAL LOW (ref >60)
Glucose, Bld: 91 mg/dL (ref 70–99)
Potassium: 4.3 mmol/L (ref 3.5–5.1)
Sodium: 138 mmol/L (ref 135–145)
Total Bilirubin: 0.7 mg/dL (ref 0.0–1.2)
Total Protein: 7.3 g/dL (ref 6.5–8.1)

## 2023-11-12 MED ORDER — IOHEXOL 300 MG/ML  SOLN
75.0000 mL | Freq: Once | INTRAMUSCULAR | Status: AC | PRN
  Administered 2023-11-12: 50 mL via INTRAVENOUS

## 2023-11-13 ENCOUNTER — Other Ambulatory Visit: Payer: Self-pay | Admitting: Cardiology

## 2023-11-19 ENCOUNTER — Inpatient Hospital Stay: Payer: Medicare HMO | Attending: Internal Medicine | Admitting: Internal Medicine

## 2023-11-19 ENCOUNTER — Other Ambulatory Visit: Payer: Self-pay

## 2023-11-19 VITALS — BP 140/70 | HR 99 | Temp 98.1°F | Resp 18 | Ht 63.0 in | Wt 166.3 lb

## 2023-11-19 DIAGNOSIS — Z85118 Personal history of other malignant neoplasm of bronchus and lung: Secondary | ICD-10-CM | POA: Diagnosis not present

## 2023-11-19 DIAGNOSIS — Z923 Personal history of irradiation: Secondary | ICD-10-CM | POA: Insufficient documentation

## 2023-11-19 DIAGNOSIS — K219 Gastro-esophageal reflux disease without esophagitis: Secondary | ICD-10-CM | POA: Insufficient documentation

## 2023-11-19 DIAGNOSIS — Z9221 Personal history of antineoplastic chemotherapy: Secondary | ICD-10-CM | POA: Insufficient documentation

## 2023-11-19 DIAGNOSIS — Z902 Acquired absence of lung [part of]: Secondary | ICD-10-CM | POA: Insufficient documentation

## 2023-11-19 DIAGNOSIS — C3490 Malignant neoplasm of unspecified part of unspecified bronchus or lung: Secondary | ICD-10-CM | POA: Diagnosis not present

## 2023-11-19 NOTE — Progress Notes (Signed)
[facility] [phone]   [phone]  OFFICE PROGRESS NOTE  Noberto Retort, MD [address] [place] [address]  DIAGNOSIS: Recurrent lung cancer initially diagnosed as stage IA (T1, N0, M0) non-small cell lung cancer, squamous cell carcinoma.  She presented with a right lower lobe lung nodule.  She was diagnosed in June 2021.    PRIOR THERAPY: Right lower lobe superior segmentectomy and lymph node dissection under the care of Dr. Dorris Fetch on 03/17/2020. 2) Concurrent chemoradiation with weekly carboplatin for AUC of 2 and paclitaxel 45 Mg/M2. Last dose on 07/10/22  Status post 6 cycles. 3) Consolidation immunotherapy with Imfinzi 1500 mg IV every 4 weeks.  First dose expected on 08/22/2022.  Status post 13 cycles.   CURRENT THERAPY:  INTERVAL HISTORY: Amy [name] 78 y.o. female is to the clinic today for follow-up visit accompanied by her husband, Amy [name].Discussed the use of AI scribe software for clinical note transcription with the patient, who gave verbal consent to proceed.  History of Present Illness   The patient is a 78 year old female with non-small cell lung cancer who presents for follow-up of her condition. She is accompanied by her husband, Amy [name].  Initially diagnosed with stage 1A non-small cell lung cancer in June 2021, she underwent surgical resection of the right lower lobe. In August 2023, she experienced a recurrence in the mediastinal lymph nodes and received chemotherapy and radiation, followed by a year of immunotherapy with durvalumab, completed in November 2024.  She is currently concerned about a 6 mm nodule in the right upper lobe, noted on a recent scan. She recalls previous instances of nodules appearing and disappearing, describing them as 'wax and weaning all the time.'  In the past three months, she has experienced shortness of breath and a cough, along with a sensation of 'pinching stuff' in her  throat. No wheezing is reported.  She reports having a lot of indigestion, which she attributes to her diet. She has been taking Tums frequently but has not needed them in the last four to five days.        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  Status post right lower lobe superior segmentectomy    ALLERGIES:  has no known allergies.  MEDICATIONS:  Current Outpatient Medications  Medication Sig Dispense Refill   acetaminophen (TYLENOL) 500 MG tablet Take 500-1,000 mg by mouth every 6 (six) hours as needed for mild pain or headache.     ADVAIR DISKUS 250-50 MCG/ACT AEPB Inhale 1 puff into the lungs 2 (two) times daily.     apixaban (ELIQUIS) 5 MG TABS tablet TAKE 1 TABLET BY MOUTH TWICE A DAY 180 tablet 1   diltiazem (CARDIZEM LA) 240 MG 24 hr tablet TAKE 1 TABLET BY MOUTH DAILY 90 tablet 3   pantoprazole (PROTONIX) 40 MG tablet Take 1 tablet (40 mg total) by mouth 2 (two) times daily. 60 tablet 0   prochlorperazine (COMPAZINE) 10 MG tablet Take 1 tablet (10 mg total) by mouth every 6 (six)  hours as needed for nausea or vomiting. 30 tablet 0   raloxifene (EVISTA) 60 MG tablet Take 60 mg by mouth daily.     rosuvastatin (CRESTOR) 10 MG tablet Take 1 tablet (10 mg total) by mouth daily. 90 tablet 2   sertraline (ZOLOFT) 50 MG tablet Take 50 mg by mouth daily.     No current facility-administered medications for this visit.    SURGICAL HISTORY:  Past Surgical History:  Procedure Laterality Date   APPENDECTOMY     BALLOON DILATION N/A 07/18/2022   Procedure: BALLOON DILATION;  Surgeon: Kerin Salen, MD;  Location: [facility];  Service:  Gastroenterology;  Laterality: N/A;   BIOPSY  07/18/2022   Procedure: BIOPSY;  Surgeon: Kerin Salen, MD;  Location: [facility];  Service: Gastroenterology;;   ESOPHAGOGASTRODUODENOSCOPY N/A 07/18/2022   Procedure: ESOPHAGOGASTRODUODENOSCOPY (EGD);  Surgeon: Kerin Salen, MD;  Location: [facility];  Service: Gastroenterology;  Laterality: N/A;   EYE SURGERY     INTERCOSTAL NERVE BLOCK Right 03/17/2020   Procedure: Intercostal Nerve Block;  Surgeon: Loreli Slot, MD;  Location: [facility] OR;  Service: Thoracic;  Laterality: Right;   LUNG SURGERY Right 03/17/2020   NODE DISSECTION Right 03/17/2020   Procedure: Node Dissection;  Surgeon: Loreli Slot, MD;  Location: [facility] OR;  Service: Thoracic;  Laterality: Right;   TUBAL LIGATION     VIDEO BRONCHOSCOPY WITH ENDOBRONCHIAL ULTRASOUND N/A 05/22/2022   Procedure: VIDEO BRONCHOSCOPY WITH ENDOBRONCHIAL ULTRASOUND;  Surgeon: Loreli Slot, MD;  Location: [facility] OR;  Service: Thoracic;  Laterality: N/A;    REVIEW OF SYSTEMS:  Constitutional: negative Eyes: negative Ears, nose, mouth, throat, and face: negative Respiratory: positive for cough Cardiovascular: negative Gastrointestinal: negative Genitourinary:negative Integument/breast: negative Hematologic/lymphatic: negative Musculoskeletal:negative Neurological: negative Behavioral/Psych: negative Endocrine: negative Allergic/Immunologic: negative   PHYSICAL EXAMINATION: General appearance: alert, cooperative, and no distress Head: Normocephalic, without obvious abnormality, atraumatic Neck: no adenopathy, no JVD, supple, symmetrical, trachea midline, and thyroid not enlarged, symmetric, no tenderness/mass/nodules Lymph nodes: Cervical, supraclavicular, and axillary nodes normal. Resp: clear to auscultation bilaterally Back: symmetric, no curvature. ROM normal. No CVA tenderness. Cardio: regular rate and rhythm, S1, S2 normal, no murmur, click, rub or gallop GI: soft,  non-tender; bowel sounds normal; no masses,  no organomegaly Extremities: extremities normal, atraumatic, no cyanosis or edema Neurologic: Alert and oriented X 3, normal strength and tone. Normal symmetric reflexes. Normal coordination and gait  ECOG PERFORMANCE STATUS: 1 - Symptomatic but completely ambulatory  Blood pressure (!) 140/70, pulse 99, temperature 98.1 F (36.7 C), temperature source Temporal, resp. rate 18, height 5\' 3"  (1.6 m), weight 166 lb 4.8 oz (75.4 kg), SpO2 99%.   LABORATORY DATA: Lab Results  Component Value Date   WBC 8.6 11/12/2023   HGB 14.1 11/12/2023   HCT 41.3 11/12/2023   MCV 90.0 11/12/2023   PLT 213 11/12/2023      Chemistry      Component Value Date/Time   NA 138 11/12/2023 1055   K 4.3 11/12/2023 1055   CL 104 11/12/2023 1055   CO2 29 11/12/2023 1055   BUN 25 (H) 11/12/2023 1055   CREATININE 1.47 (H) 11/12/2023 1055      Component Value Date/Time   CALCIUM 10.2 11/12/2023 1055   ALKPHOS 83 11/12/2023 1055   AST 17 11/12/2023 1055   ALT 10 11/12/2023 1055   BILITOT 0.7 11/12/2023 1055       RADIOGRAPHIC STUDIES: CT Chest W Contrast Result Date: 11/16/2023 CLINICAL DATA:  Restaging non-small cell  lung cancer. * Tracking Code: BO * EXAM: CT CHEST WITH CONTRAST TECHNIQUE: Multidetector CT imaging of the chest was performed during intravenous contrast administration. RADIATION DOSE REDUCTION: This exam was performed according to the departmental dose-optimization program which includes automated exposure control, adjustment of the mA and/or kV according to patient size and/or use of iterative reconstruction technique. CONTRAST:  50mL OMNIPAQUE IOHEXOL 300 MG/ML  SOLN COMPARISON:  08/13/2023 FINDINGS: Cardiovascular: Heart size is normal. Aortic atherosclerosis and coronary artery calcifications. No pericardial effusion. Mediastinum/Nodes: Thyroid gland, trachea and esophagus are unremarkable. No enlarged mediastinal or hilar lymph nodes.  Lungs/Pleura: Postoperative changes and post radiation changes involving the right mid and right lower lung are again seen and appears [name] compared with the previous exam. Associated perihilar fibrosis, volume loss and pleural thickening is again seen and also appears [name]. Several lung nodules are identified bilaterally, including: -Tiny nodule within the posterior right upper lobe is [name] measuring 4 mm, image 71/8. -New nodule identified within the anterior right midlung measuring 6 mm, image 98/8. -Nodule within the posterior aspect of the superior segment of left lower lobe is [name] measuring 5 mm, image 48/8. -Left upper lobe lung nodule is unchanged measuring 6 mm, image 38/8. Upper Abdomen: No acute abnormality within the imaged portions of the upper abdomen. Musculoskeletal: No acute abnormality. IMPRESSION: 1. [name] postoperative and post radiation changes involving the right mid and right lower lung. 2. New 6 mm nodule within the anterior right midlung. This has a nonspecific appearance. Attention to this nodule on follow-up imaging advised. 3. [name] appearance of additional bilateral lung nodules. 4. Coronary artery calcifications. 5.  Aortic Atherosclerosis (ICD10-I70.0). Electronically Signed   By: Signa Kell M.D.   On: 11/16/2023 16:51    ASSESSMENT AND PLAN: This is a very pleasant 78 years old white female with suspicious recurrent non-small cell lung cancer initially diagnosed as history of stage IA (T1 a, N0, M0) non-small cell lung cancer, squamous cell carcinoma presented with right lower lobe lung nodule diagnosed in June 2021 status post right lower lobe superior segmentectomy with lymph node dissection under the care of Dr. Dorris Fetch on March 17, 2020.  The patient has evidence for disease recurrence in July 2023. The patient is feeling fine today with no concerning complaints except for the back pain. She had repeat CT scan of the chest performed recently.  I personally  and independently reviewed the scan images and discussed the results with the patient today. Her scan showed mild increase in the right infrahilar soft tissue encasing the bronchus intermedius and disease recurrence could not be completely excluded.  There was also new mild bilateral lower paratracheal lymphadenopathy that is nonspecific and nodal metastasis could not be completely excluded. The patient had a PET scan performed recently and that showed intensely hypermetabolic recurrence in the right infrahilar region with mildly prominent left paratracheal and paraesophageal lymph nodes also mildly hypermetabolic for size and suspicious for nodal metastasis but no distant metastatic disease. The patient underwent repeat bronchoscopy with EBUS under the care of Dr. Dorris Fetch and the final pathology was consistent with recurrent squamous cell carcinoma of the lung.  MRI of the brain was negative for malignancy. She underwent treatment with concurrent chemoradiation with weekly carboplatin for AUC of 2 and paclitaxel 45 Mg/M2.  First dose today June 05, 2022.  Status post 6 cycles.  She has partial response. The patient completed consolidation treatment with immunotherapy with Imfinzi 1500 Mg IV every 4 weeks status post 13 cycles.  She is currently on observation and feeling fine with no concerning complaints except for mild cough. She had repeat CT scan of the chest performed recently.  I personally and independently reviewed the scan images and discussed the result with the patient and her husband.  Her scan showed no concerning findings for disease progression but there was a 6 mm nodule in the right mid lung that need close monitoring on upcoming imaging studies. Assessment and Plan    Non-Small Cell Lung Cancer (NSCLC)   Stage 1A NSCLC initially diagnosed in June 2021 with surgical resection of the right lower lobe. Recurrence in mediastinal lymph nodes in August 2023 treated with chemotherapy,  radiation, and one year of durvalumab, completed in November 2024. Currently in remission with no signs of cancer growth or spread. A 6mm nodule in the right upper lobe noted on recent scan, likely inflammatory but requires monitoring. Explained that the nodule's appearance and disappearance is common and not necessarily indicative of cancer recurrence.   - Order follow-up scan in three months   - Order lab tests one week before next visit   - Monitor for any new symptoms and advise to call if any arise    Gastroesophageal Reflux Disease (GERD)   Intermittent indigestion possibly related to diet. Reports improvement with Tums and has not needed them in the last four to five days. Discussed dietary triggers and the use of acid reflux medication if symptoms persist.   - Monitor diet and avoid trigger foods   - Consider acid reflux medication if symptoms persist    Follow-up   - Schedule follow-up visit in three months   - Order lab tests and scan one week before next visit.   She was advised to call immediately if she has any other concerning symptoms in the interval. The patient voices understanding of current disease status and treatment options and is in agreement with the current care plan.  All questions were answered. The patient knows to call the clinic with any problems, questions or concerns. We can certainly see the patient much sooner if necessary. The total time spent in the appointment was 30 minutes.   Disclaimer: This note was dictated with voice recognition software. Similar sounding words can inadvertently be transcribed and may not be corrected upon review.

## 2023-11-20 ENCOUNTER — Other Ambulatory Visit: Payer: Self-pay

## 2023-11-21 ENCOUNTER — Other Ambulatory Visit: Payer: Self-pay

## 2024-01-29 DIAGNOSIS — N898 Other specified noninflammatory disorders of vagina: Secondary | ICD-10-CM | POA: Diagnosis not present

## 2024-01-29 DIAGNOSIS — I480 Paroxysmal atrial fibrillation: Secondary | ICD-10-CM | POA: Diagnosis not present

## 2024-01-29 DIAGNOSIS — E7800 Pure hypercholesterolemia, unspecified: Secondary | ICD-10-CM | POA: Diagnosis not present

## 2024-01-29 DIAGNOSIS — F324 Major depressive disorder, single episode, in partial remission: Secondary | ICD-10-CM | POA: Diagnosis not present

## 2024-01-29 DIAGNOSIS — N183 Chronic kidney disease, stage 3 unspecified: Secondary | ICD-10-CM | POA: Diagnosis not present

## 2024-01-29 DIAGNOSIS — G4700 Insomnia, unspecified: Secondary | ICD-10-CM | POA: Diagnosis not present

## 2024-01-29 DIAGNOSIS — K219 Gastro-esophageal reflux disease without esophagitis: Secondary | ICD-10-CM | POA: Diagnosis not present

## 2024-01-29 DIAGNOSIS — I10 Essential (primary) hypertension: Secondary | ICD-10-CM | POA: Diagnosis not present

## 2024-01-29 DIAGNOSIS — Z0000 Encounter for general adult medical examination without abnormal findings: Secondary | ICD-10-CM | POA: Diagnosis not present

## 2024-01-29 DIAGNOSIS — J449 Chronic obstructive pulmonary disease, unspecified: Secondary | ICD-10-CM | POA: Diagnosis not present

## 2024-01-29 DIAGNOSIS — M810 Age-related osteoporosis without current pathological fracture: Secondary | ICD-10-CM | POA: Diagnosis not present

## 2024-01-29 DIAGNOSIS — L309 Dermatitis, unspecified: Secondary | ICD-10-CM | POA: Diagnosis not present

## 2024-02-10 ENCOUNTER — Other Ambulatory Visit: Payer: Self-pay | Admitting: Cardiology

## 2024-02-10 DIAGNOSIS — I480 Paroxysmal atrial fibrillation: Secondary | ICD-10-CM

## 2024-02-11 ENCOUNTER — Ambulatory Visit (HOSPITAL_COMMUNITY)
Admission: RE | Admit: 2024-02-11 | Discharge: 2024-02-11 | Disposition: A | Discharge status: Home / Self Care | Payer: Medicare HMO | Source: Ambulatory Visit | Attending: Internal Medicine | Admitting: Internal Medicine

## 2024-02-11 ENCOUNTER — Encounter (HOSPITAL_COMMUNITY): Payer: Self-pay

## 2024-02-11 ENCOUNTER — Inpatient Hospital Stay: Payer: Medicare HMO | Attending: Internal Medicine

## 2024-02-11 DIAGNOSIS — C3490 Malignant neoplasm of unspecified part of unspecified bronchus or lung: Secondary | ICD-10-CM

## 2024-02-11 DIAGNOSIS — R059 Cough, unspecified: Secondary | ICD-10-CM | POA: Diagnosis not present

## 2024-02-11 DIAGNOSIS — Z85118 Personal history of other malignant neoplasm of bronchus and lung: Secondary | ICD-10-CM | POA: Diagnosis not present

## 2024-02-11 DIAGNOSIS — J432 Centrilobular emphysema: Secondary | ICD-10-CM | POA: Diagnosis not present

## 2024-02-11 DIAGNOSIS — I700 Atherosclerosis of aorta: Secondary | ICD-10-CM | POA: Diagnosis not present

## 2024-02-11 DIAGNOSIS — R0602 Shortness of breath: Secondary | ICD-10-CM | POA: Diagnosis not present

## 2024-02-11 LAB — CBC WITH DIFFERENTIAL (CANCER CENTER ONLY)
Abs Immature Granulocytes: 0.02 10*3/uL (ref 0.00–0.07)
Basophils Absolute: 0 10*3/uL (ref 0.0–0.1)
Basophils Relative: 1 %
Eosinophils Absolute: 0.1 10*3/uL (ref 0.0–0.5)
Eosinophils Relative: 2 %
HCT: 40.2 % (ref 36.0–46.0)
Hemoglobin: 13.6 g/dL (ref 12.0–15.0)
Immature Granulocytes: 0 %
Lymphocytes Relative: 15 %
Lymphs Abs: 1.2 10*3/uL (ref 0.7–4.0)
MCH: 31.1 pg (ref 26.0–34.0)
MCHC: 33.8 g/dL (ref 30.0–36.0)
MCV: 91.8 fL (ref 80.0–100.0)
Monocytes Absolute: 0.6 10*3/uL (ref 0.1–1.0)
Monocytes Relative: 8 %
Neutro Abs: 5.8 10*3/uL (ref 1.7–7.7)
Neutrophils Relative %: 74 %
Platelet Count: 223 10*3/uL (ref 150–400)
RBC: 4.38 MIL/uL (ref 3.87–5.11)
RDW: 12.9 % (ref 11.5–15.5)
WBC Count: 7.8 10*3/uL (ref 4.0–10.5)
nRBC: 0 % (ref 0.0–0.2)

## 2024-02-11 LAB — CMP (CANCER CENTER ONLY)
ALT: 9 U/L (ref 0–44)
AST: 14 U/L — ABNORMAL LOW (ref 15–41)
Albumin: 4 g/dL (ref 3.5–5.0)
Alkaline Phosphatase: 78 U/L (ref 38–126)
Anion gap: 5 (ref 5–15)
BUN: 24 mg/dL — ABNORMAL HIGH (ref 8–23)
CO2: 30 mmol/L (ref 22–32)
Calcium: 9.2 mg/dL (ref 8.9–10.3)
Chloride: 105 mmol/L (ref 98–111)
Creatinine: 1.48 mg/dL — ABNORMAL HIGH (ref 0.44–1.00)
GFR, Estimated: 36 mL/min — ABNORMAL LOW (ref >60)
Glucose, Bld: 97 mg/dL (ref 70–99)
Potassium: 4.2 mmol/L (ref 3.5–5.1)
Sodium: 140 mmol/L (ref 135–145)
Total Bilirubin: 0.5 mg/dL (ref 0.0–1.2)
Total Protein: 6.6 g/dL (ref 6.5–8.1)

## 2024-02-11 MED ORDER — APIXABAN 5 MG PO TABS: 5.0000 mg | ORAL_TABLET | Freq: Two times a day (BID) | ORAL | 1 refills | Status: DC

## 2024-02-11 MED ORDER — IOHEXOL 300 MG/ML  SOLN
75.0000 mL | Freq: Once | INTRAMUSCULAR | Status: AC | PRN
  Administered 2024-02-11: 50 mL via INTRAVENOUS

## 2024-02-11 MED ORDER — SODIUM CHLORIDE (PF) 0.9 % IJ SOLN
INTRAMUSCULAR | Status: AC
Start: 2024-02-11 — End: ?
  Filled 2024-02-11: qty 50

## 2024-02-11 NOTE — Telephone Encounter (Signed)
 Prescription refill request for Eliquis  received. Indication: PAF Last office visit: 09/18/23  Annell Kidney MD Scr: 1.47 on 11/12/23  Epic Age: 78 Weight: 73.5kg  Based on above findings Eliquis  5mg  twice daily is the appropriate dose.  Refill approved.

## 2024-02-11 NOTE — Addendum Note (Signed)
 Addended by: Lia Rede on: 02/11/2024 01:55 PM   Modules accepted: Orders

## 2024-02-12 ENCOUNTER — Other Ambulatory Visit: Payer: Self-pay | Admitting: Cardiology

## 2024-02-12 DIAGNOSIS — N952 Postmenopausal atrophic vaginitis: Secondary | ICD-10-CM | POA: Diagnosis not present

## 2024-02-12 DIAGNOSIS — N941 Unspecified dyspareunia: Secondary | ICD-10-CM | POA: Diagnosis not present

## 2024-02-12 DIAGNOSIS — I480 Paroxysmal atrial fibrillation: Secondary | ICD-10-CM

## 2024-02-12 NOTE — Telephone Encounter (Signed)
 Prescription refill request for Eliquis  received. Indication:afib Last office visit:12/24 Scr:1.48  4/25 Age: 78 Weight:75.4  kg  Prescription refilled

## 2024-02-18 ENCOUNTER — Inpatient Hospital Stay: Payer: Medicare HMO | Attending: Internal Medicine | Admitting: Internal Medicine

## 2024-02-18 ENCOUNTER — Encounter: Payer: Self-pay | Admitting: Internal Medicine

## 2024-02-18 VITALS — BP 138/63 | HR 92 | Temp 98.0°F | Resp 17 | Ht 63.0 in | Wt 162.8 lb

## 2024-02-18 DIAGNOSIS — R059 Cough, unspecified: Secondary | ICD-10-CM | POA: Diagnosis not present

## 2024-02-18 DIAGNOSIS — R0602 Shortness of breath: Secondary | ICD-10-CM | POA: Insufficient documentation

## 2024-02-18 DIAGNOSIS — Z85118 Personal history of other malignant neoplasm of bronchus and lung: Secondary | ICD-10-CM | POA: Insufficient documentation

## 2024-02-18 DIAGNOSIS — C3431 Malignant neoplasm of lower lobe, right bronchus or lung: Secondary | ICD-10-CM

## 2024-02-18 DIAGNOSIS — Z9221 Personal history of antineoplastic chemotherapy: Secondary | ICD-10-CM | POA: Diagnosis not present

## 2024-02-18 DIAGNOSIS — Z923 Personal history of irradiation: Secondary | ICD-10-CM | POA: Diagnosis not present

## 2024-02-18 DIAGNOSIS — R1310 Dysphagia, unspecified: Secondary | ICD-10-CM | POA: Insufficient documentation

## 2024-02-18 NOTE — Progress Notes (Signed)
 [facility] [phone]   [phone]  OFFICE PROGRESS NOTE  Amy Congo, MD [address] [place] [address]  DIAGNOSIS: Recurrent lung cancer initially diagnosed as stage IA (T1, N0, M0) non-small cell lung cancer, squamous cell carcinoma.  She presented with a right lower lobe lung nodule.  She was diagnosed in June 2021.    PRIOR THERAPY: Right lower lobe superior segmentectomy and lymph node dissection under the care of Dr. Luna Salinas on 03/17/2020. 2) Concurrent chemoradiation with weekly carboplatin  for AUC of 2 and paclitaxel  45 Mg/M2. Last dose on 07/10/22  Status post 6 cycles. 3) Consolidation immunotherapy with Imfinzi  1500 mg IV every 4 weeks.  First dose expected on 08/22/2022.  Status post 13 cycles.   CURRENT THERAPY:  INTERVAL HISTORY: Amy [name] 78 y.o. female is to the clinic today for follow-up visit accompanied by her husband, Amy [name].Discussed the use of AI scribe software for clinical note transcription with the patient, who gave verbal consent to proceed.  History of Present Illness   Amy [name] is a 78 year old female with recurrent non-small cell lung cancer who presents for evaluation with a repeat CT scan of the chest. She is accompanied by her husband, Amy [name].  She has a history of recurrent non-small cell lung cancer, initially diagnosed in June 2021. She underwent concurrent chemoradiation followed by consolidation treatment with immunotherapy using Imfinzi . Since November 2024, she has been on observation.  She experiences a cough, particularly when eating or drinking, which sometimes feels like it gets 'hung' in her throat. She has had her esophagus stretched once in the past while hospitalized, which provided some relief, but the symptoms have returned. Her husband frequently reminds her to drink water to alleviate the coughing.  She also experiences dizziness, which she attributes to her  legs, and notes that she sometimes runs out of breath, such as when washing dishes. Despite these symptoms, she remains active, as indicated by her involvement in household chores like dishwashing.         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  Status post right lower lobe superior segmentectomy    ALLERGIES:  has no known allergies.  MEDICATIONS:  Current Outpatient Medications  Medication Sig Dispense Refill   acetaminophen  (TYLENOL ) 500 MG tablet Take 500-1,000 mg by mouth every 6 (six) hours as needed for mild pain or headache.     ADVAIR DISKUS 250-50 MCG/ACT AEPB Inhale 1 puff into the lungs 2 (two) times daily.     diltiazem  (CARDIZEM  LA) 240 MG 24 hr tablet TAKE 1 TABLET BY MOUTH DAILY 90 tablet 3   ELIQUIS  5 MG TABS tablet TAKE 1 TABLET BY MOUTH 2 TIMES A DAY 180 tablet 1   pantoprazole  (PROTONIX ) 40 MG tablet Take 1 tablet (40 mg total) by mouth 2 (two) times daily. 60 tablet 0   prochlorperazine  (COMPAZINE ) 10 MG tablet Take 1 tablet (10 mg total) by mouth every 6 (six) hours as needed for nausea or vomiting. 30 tablet 0  raloxifene  (EVISTA ) 60 MG tablet Take 60 mg by mouth daily.     rosuvastatin  (CRESTOR ) 10 MG tablet Take 1 tablet (10 mg total) by mouth daily. 90 tablet 2   sertraline  (ZOLOFT ) 50 MG tablet Take 50 mg by mouth daily.     No current facility-administered medications for this visit.    SURGICAL HISTORY:  Past Surgical History:  Procedure Laterality Date   APPENDECTOMY     BALLOON DILATION N/A 07/18/2022   Procedure: BALLOON DILATION;  Surgeon: Genell Ken, MD;  Location: [facility];  Service: Gastroenterology;   Laterality: N/A;   BIOPSY  07/18/2022   Procedure: BIOPSY;  Surgeon: Genell Ken, MD;  Location: [facility];  Service: Gastroenterology;;   ESOPHAGOGASTRODUODENOSCOPY N/A 07/18/2022   Procedure: ESOPHAGOGASTRODUODENOSCOPY (EGD);  Surgeon: Genell Ken, MD;  Location: [facility];  Service: Gastroenterology;  Laterality: N/A;   EYE SURGERY     INTERCOSTAL NERVE BLOCK Right 03/17/2020   Procedure: Intercostal Nerve Block;  Surgeon: Zelphia Higashi, MD;  Location: [facility] OR;  Service: Thoracic;  Laterality: Right;   LUNG SURGERY Right 03/17/2020   NODE DISSECTION Right 03/17/2020   Procedure: Node Dissection;  Surgeon: Zelphia Higashi, MD;  Location: [facility] OR;  Service: Thoracic;  Laterality: Right;   TUBAL LIGATION     VIDEO BRONCHOSCOPY WITH ENDOBRONCHIAL ULTRASOUND N/A 05/22/2022   Procedure: VIDEO BRONCHOSCOPY WITH ENDOBRONCHIAL ULTRASOUND;  Surgeon: Zelphia Higashi, MD;  Location: [facility] OR;  Service: Thoracic;  Laterality: N/A;    REVIEW OF SYSTEMS:  Constitutional: positive for fatigue Eyes: negative Ears, nose, mouth, throat, and face: negative Respiratory: positive for cough Cardiovascular: negative Gastrointestinal: negative Genitourinary:negative Integument/breast: negative Hematologic/lymphatic: negative Musculoskeletal:negative Neurological: negative Behavioral/Psych: negative Endocrine: negative Allergic/Immunologic: negative   PHYSICAL EXAMINATION: General appearance: alert, cooperative, fatigued, and no distress Head: Normocephalic, without obvious abnormality, atraumatic Neck: no adenopathy, no JVD, supple, symmetrical, trachea midline, and thyroid  not enlarged, symmetric, no tenderness/mass/nodules Lymph nodes: Cervical, supraclavicular, and axillary nodes normal. Resp: clear to auscultation bilaterally Back: symmetric, no curvature. ROM normal. No CVA tenderness. Cardio: regular rate and rhythm, S1, S2 normal, no murmur, click, rub or gallop GI: soft,  non-tender; bowel sounds normal; no masses,  no organomegaly Extremities: extremities normal, atraumatic, no cyanosis or edema Neurologic: Alert and oriented X 3, normal strength and tone. Normal symmetric reflexes. Normal coordination and gait  ECOG PERFORMANCE STATUS: 1 - Symptomatic but completely ambulatory  Blood pressure 138/63, pulse 92, temperature 98 F (36.7 C), temperature source Temporal, resp. rate 17, height 5\' 3"  (1.6 m), weight 162 lb 12.8 oz (73.8 kg), SpO2 100%.   LABORATORY DATA: Lab Results  Component Value Date   WBC 7.8 02/11/2024   HGB 13.6 02/11/2024   HCT 40.2 02/11/2024   MCV 91.8 02/11/2024   PLT 223 02/11/2024      Chemistry      Component Value Date/Time   NA 140 02/11/2024 1102   K 4.2 02/11/2024 1102   CL 105 02/11/2024 1102   CO2 30 02/11/2024 1102   BUN 24 (H) 02/11/2024 1102   CREATININE 1.48 (H) 02/11/2024 1102      Component Value Date/Time   CALCIUM  9.2 02/11/2024 1102   ALKPHOS 78 02/11/2024 1102   AST 14 (L) 02/11/2024 1102   ALT 9 02/11/2024 1102   BILITOT 0.5 02/11/2024 1102       RADIOGRAPHIC STUDIES: CT Chest W Contrast Result Date: 02/16/2024 CLINICAL DATA:  Non-small-cell lung cancer. Restaging. * Tracking Code: BO * EXAM: CT CHEST  WITH CONTRAST TECHNIQUE: Multidetector CT imaging of the chest was performed during intravenous contrast administration. RADIATION DOSE REDUCTION: This exam was performed according to the departmental dose-optimization program which includes automated exposure control, adjustment of the mA and/or kV according to patient size and/or use of iterative reconstruction technique. CONTRAST:  50mL OMNIPAQUE  IOHEXOL  300 MG/ML  SOLN COMPARISON:  11/12/2023 FINDINGS: Cardiovascular: The heart size is normal. No substantial pericardial effusion. Coronary artery calcification is evident. Moderate atherosclerotic calcification is noted in the wall of the thoracic aorta. Mediastinum/Nodes: No mediastinal  lymphadenopathy. There is no hilar lymphadenopathy. The esophagus has normal imaging features. There is no axillary lymphadenopathy. Lungs/Pleura: Centrilobular and paraseptal emphysema evident. Stable volume loss right hemithorax with surgical changes in the right hilum. Right parahilar scarring is stable in appearance in the interval. 4 mm right lung nodule on 80/5 is unchanged in the interval. 6 mm nodule identified anterior right lung previously has resolved in the interval. Stable pleuroparenchymal scarring in the left apex. 6 mm suprahilar left upper lobe pulmonary nodule identified previously is stable on image 46/5 today. 5 mm perifissural left lower lobe nodule identified previously is stable on 56/5. No new suspicious pulmonary nodule or mass on today's exam. No pleural effusion. Upper Abdomen: Scattered tiny hypodensities in the liver parenchyma are too small to characterize but are statistically most likely benign. No followup imaging is recommended. No adrenal nodule or mass. The visualized portion of the upper abdomen is otherwise unremarkable. Musculoskeletal: No worrisome lytic or sclerotic osseous abnormality. IMPRESSION: 1. Stable exam. No new or progressive findings to suggest recurrent or metastatic disease in the chest. 2. Stable volume loss right hemithorax with surgical changes in the right hilum. Post treatment scarring in the parahilar right lung is unchanged. 3. Stable bilateral pulmonary nodules. The 6 mm anterior right lung nodule identified previously as new has resolved in the interval. 4. Aortic Atherosclerosis (ICD10-I70.0) and Emphysema (ICD10-J43.9). Electronically Signed   By: Donnal Fusi M.D.   On: 02/16/2024 12:29    ASSESSMENT AND PLAN: This is a very pleasant 78 years old white female with suspicious recurrent non-small cell lung cancer initially diagnosed as history of stage IA (T1 a, N0, M0) non-small cell lung cancer, squamous cell carcinoma presented with right lower  lobe lung nodule diagnosed in June 2021 status post right lower lobe superior segmentectomy with lymph node dissection under the care of Dr. Luna Salinas on March 17, 2020.  The patient has evidence for disease recurrence in July 2023. The patient is feeling fine today with no concerning complaints except for the back pain. She had repeat CT scan of the chest performed recently.  I personally and independently reviewed the scan images and discussed the results with the patient today. Her scan showed mild increase in the right infrahilar soft tissue encasing the bronchus intermedius and disease recurrence could not be completely excluded.  There was also new mild bilateral lower paratracheal lymphadenopathy that is nonspecific and nodal metastasis could not be completely excluded. The patient had a PET scan performed recently and that showed intensely hypermetabolic recurrence in the right infrahilar region with mildly prominent left paratracheal and paraesophageal lymph nodes also mildly hypermetabolic for size and suspicious for nodal metastasis but no distant metastatic disease. The patient underwent repeat bronchoscopy with EBUS under the care of Dr. Luna Salinas and the final pathology was consistent with recurrent squamous cell carcinoma of the lung.  MRI of the brain was negative for malignancy. She underwent treatment with concurrent chemoradiation with  weekly carboplatin  for AUC of 2 and paclitaxel  45 Mg/M2.  First dose today June 05, 2022.  Status post 6 cycles.  She has partial response. The patient completed consolidation treatment with immunotherapy with Imfinzi  1500 Mg IV every 4 weeks status post 13 cycles.  She is currently on observation and feeling well with no concerning complaints except for mild cough and fatigue. She had repeat CT scan of the chest performed recently.  I personally and independently reviewed the scan and discussed the result with the patient and her husband.  Her scan  showed no concerning findings for disease progression.    Recurrent non-small cell lung cancer Recurrent non-small cell lung cancer, initially diagnosed in June 2021. Status post concurrent chemoradiation and consolidation treatment with durvalumab . Under observation since November 2024. Recent CT scan shows no evidence of disease progression. Previous 6 mm anterior right lower lobe nodule has resolved, indicating no current active disease. - Schedule next CT scan in 3 months  Dyspnea Reports dyspnea, particularly during activities such as washing dishes. No acute changes in recent evaluation.  Cough Intermittent cough, particularly when eating or drinking. No acute changes in recent evaluation.  Dysphagia Dysphagia with sensation of food getting stuck in the throat. Esophageal dilation performed once previously. Discussed potential for further dilation if necessary, provided no significant scarring. - Consider referral to gastroenterologist for evaluation of esophageal dilation   She was advised to call immediately if she has any other concerning symptoms in the interval. The patient voices understanding of current disease status and treatment options and is in agreement with the current care plan.  All questions were answered. The patient knows to call the clinic with any problems, questions or concerns. We can certainly see the patient much sooner if necessary. The total time spent in the appointment was 30 minutes.   Disclaimer: This note was dictated with voice recognition software. Similar sounding words can inadvertently be transcribed and may not be corrected upon review.

## 2024-05-13 DIAGNOSIS — Z01419 Encounter for gynecological examination (general) (routine) without abnormal findings: Secondary | ICD-10-CM | POA: Diagnosis not present

## 2024-05-13 DIAGNOSIS — N952 Postmenopausal atrophic vaginitis: Secondary | ICD-10-CM | POA: Diagnosis not present

## 2024-06-04 ENCOUNTER — Other Ambulatory Visit: Payer: Self-pay | Admitting: Cardiology

## 2024-06-06 ENCOUNTER — Other Ambulatory Visit: Payer: Self-pay

## 2024-07-15 DIAGNOSIS — Z86018 Personal history of other benign neoplasm: Secondary | ICD-10-CM | POA: Diagnosis not present

## 2024-07-15 DIAGNOSIS — Z8679 Personal history of other diseases of the circulatory system: Secondary | ICD-10-CM | POA: Diagnosis not present

## 2024-07-15 DIAGNOSIS — Z7901 Long term (current) use of anticoagulants: Secondary | ICD-10-CM | POA: Diagnosis not present

## 2024-07-15 DIAGNOSIS — Z859 Personal history of malignant neoplasm, unspecified: Secondary | ICD-10-CM | POA: Diagnosis not present

## 2024-07-15 DIAGNOSIS — K5900 Constipation, unspecified: Secondary | ICD-10-CM | POA: Diagnosis not present

## 2024-08-15 ENCOUNTER — Telehealth: Payer: Self-pay | Admitting: Acute Care

## 2024-08-15 NOTE — Telephone Encounter (Signed)
 Patient called in to [facility] and left VM. Called pt back and left VM.   Currently, there isnt a referral in for [facility]. Patient had a CT chest in 01/2024 and is followed by Dr. Sherrod. At pt's alst visit with Dr. Sherrod he advised a CT chest in 3 months which has not been scheduled yet. Will need to discuss this with the pt.

## 2024-08-18 ENCOUNTER — Telehealth: Payer: Self-pay

## 2024-08-18 NOTE — Telephone Encounter (Signed)
 Spoke with patient. Advises her 6 month follow up CT is due. No current referral, she is also 78 outside of the CMS guidelines for  LCS with our department. There is also no order for a 6 month follow up scan. Pt advises she will call Dr. Joyceann office to request order as they typically arrange her follow up CT's.

## 2024-10-17 ENCOUNTER — Other Ambulatory Visit: Payer: Self-pay | Admitting: Family Medicine

## 2024-10-17 DIAGNOSIS — Z1231 Encounter for screening mammogram for malignant neoplasm of breast: Secondary | ICD-10-CM

## 2024-10-18 ENCOUNTER — Encounter: Payer: Self-pay | Admitting: Internal Medicine

## 2024-10-19 ENCOUNTER — Other Ambulatory Visit: Payer: Self-pay

## 2024-10-22 ENCOUNTER — Encounter: Payer: Self-pay | Admitting: Internal Medicine

## 2024-10-22 ENCOUNTER — Ambulatory Visit
Admission: RE | Admit: 2024-10-22 | Discharge: 2024-10-22 | Disposition: A | Discharge status: Home / Self Care | Source: Ambulatory Visit | Attending: Family Medicine | Admitting: Family Medicine

## 2024-10-22 DIAGNOSIS — Z1231 Encounter for screening mammogram for malignant neoplasm of breast: Secondary | ICD-10-CM

## 2024-10-31 ENCOUNTER — Other Ambulatory Visit: Payer: Self-pay

## 2024-11-13 ENCOUNTER — Other Ambulatory Visit: Payer: Self-pay | Admitting: Cardiology

## 2025-01-01 ENCOUNTER — Ambulatory Visit: Admitting: Cardiology
